# Patient Record
Sex: Male | Born: 1950 | Hispanic: Yes | Marital: Single | Smoking: Former smoker
Health system: Southern US, Community
[De-identification: ages and names within clinical notes are randomized; demographics above are authoritative.]

## PROBLEM LIST (undated history)

## (undated) DIAGNOSIS — E041 Nontoxic single thyroid nodule: Secondary | ICD-10-CM

## (undated) DIAGNOSIS — G459 Transient cerebral ischemic attack, unspecified: Secondary | ICD-10-CM

## (undated) DIAGNOSIS — R911 Solitary pulmonary nodule: Secondary | ICD-10-CM

## (undated) DIAGNOSIS — D649 Anemia, unspecified: Secondary | ICD-10-CM

## (undated) DIAGNOSIS — Z87891 Personal history of nicotine dependence: Secondary | ICD-10-CM

## (undated) DIAGNOSIS — K8010 Calculus of gallbladder with chronic cholecystitis without obstruction: Secondary | ICD-10-CM

## (undated) DIAGNOSIS — Z720 Tobacco use: Secondary | ICD-10-CM

## (undated) DIAGNOSIS — I10 Essential (primary) hypertension: Secondary | ICD-10-CM

## (undated) DIAGNOSIS — I82409 Acute embolism and thrombosis of unspecified deep veins of unspecified lower extremity: Secondary | ICD-10-CM

## (undated) DIAGNOSIS — I2699 Other pulmonary embolism without acute cor pulmonale: Secondary | ICD-10-CM

## (undated) DIAGNOSIS — D72829 Elevated white blood cell count, unspecified: Secondary | ICD-10-CM

## (undated) DIAGNOSIS — E119 Type 2 diabetes mellitus without complications: Secondary | ICD-10-CM

## (undated) HISTORY — PX: OTHER SURGICAL HISTORY: SHX169

## (undated) HISTORY — DX: Elevated white blood cell count, unspecified: D72.829

---

## 2020-10-23 DIAGNOSIS — I82409 Acute embolism and thrombosis of unspecified deep veins of unspecified lower extremity: Secondary | ICD-10-CM

## 2020-10-23 HISTORY — DX: Acute embolism and thrombosis of unspecified deep veins of unspecified lower extremity: I82.409

## 2021-04-12 ENCOUNTER — Inpatient Hospital Stay
Admission: EM | Admit: 2021-04-12 | Discharge: 2021-04-14 | DRG: 176 | Disposition: A | Discharge status: Home / Self Care | Payer: Medicare Other | Attending: Student | Admitting: Student

## 2021-04-12 ENCOUNTER — Encounter: Payer: Self-pay | Admitting: Internal Medicine

## 2021-04-12 ENCOUNTER — Emergency Department: Payer: Medicare Other

## 2021-04-12 ENCOUNTER — Inpatient Hospital Stay: Payer: Medicare Other

## 2021-04-12 ENCOUNTER — Other Ambulatory Visit: Payer: Self-pay

## 2021-04-12 DIAGNOSIS — R918 Other nonspecific abnormal finding of lung field: Secondary | ICD-10-CM | POA: Diagnosis present

## 2021-04-12 DIAGNOSIS — Z7982 Long term (current) use of aspirin: Secondary | ICD-10-CM

## 2021-04-12 DIAGNOSIS — E041 Nontoxic single thyroid nodule: Secondary | ICD-10-CM | POA: Diagnosis present

## 2021-04-12 DIAGNOSIS — Z79899 Other long term (current) drug therapy: Secondary | ICD-10-CM | POA: Diagnosis not present

## 2021-04-12 DIAGNOSIS — I82432 Acute embolism and thrombosis of left popliteal vein: Secondary | ICD-10-CM | POA: Diagnosis present

## 2021-04-12 DIAGNOSIS — R778 Other specified abnormalities of plasma proteins: Secondary | ICD-10-CM | POA: Diagnosis present

## 2021-04-12 DIAGNOSIS — F17210 Nicotine dependence, cigarettes, uncomplicated: Secondary | ICD-10-CM | POA: Diagnosis present

## 2021-04-12 DIAGNOSIS — I700 Atherosclerosis of aorta: Secondary | ICD-10-CM | POA: Diagnosis present

## 2021-04-12 DIAGNOSIS — Z823 Family history of stroke: Secondary | ICD-10-CM

## 2021-04-12 DIAGNOSIS — R110 Nausea: Secondary | ICD-10-CM | POA: Diagnosis present

## 2021-04-12 DIAGNOSIS — R0789 Other chest pain: Secondary | ICD-10-CM | POA: Diagnosis present

## 2021-04-12 DIAGNOSIS — I82409 Acute embolism and thrombosis of unspecified deep veins of unspecified lower extremity: Secondary | ICD-10-CM | POA: Diagnosis present

## 2021-04-12 DIAGNOSIS — Z20822 Contact with and (suspected) exposure to covid-19: Secondary | ICD-10-CM | POA: Diagnosis present

## 2021-04-12 DIAGNOSIS — E876 Hypokalemia: Secondary | ICD-10-CM | POA: Diagnosis present

## 2021-04-12 DIAGNOSIS — I2699 Other pulmonary embolism without acute cor pulmonale: Secondary | ICD-10-CM | POA: Diagnosis present

## 2021-04-12 DIAGNOSIS — I10 Essential (primary) hypertension: Secondary | ICD-10-CM | POA: Diagnosis present

## 2021-04-12 DIAGNOSIS — J438 Other emphysema: Secondary | ICD-10-CM | POA: Diagnosis present

## 2021-04-12 DIAGNOSIS — R079 Chest pain, unspecified: Secondary | ICD-10-CM

## 2021-04-12 DIAGNOSIS — R6883 Chills (without fever): Secondary | ICD-10-CM | POA: Diagnosis present

## 2021-04-12 DIAGNOSIS — Z720 Tobacco use: Secondary | ICD-10-CM | POA: Insufficient documentation

## 2021-04-12 DIAGNOSIS — R7989 Other specified abnormal findings of blood chemistry: Secondary | ICD-10-CM | POA: Diagnosis present

## 2021-04-12 DIAGNOSIS — I82412 Acute embolism and thrombosis of left femoral vein: Secondary | ICD-10-CM | POA: Diagnosis present

## 2021-04-12 DIAGNOSIS — D72829 Elevated white blood cell count, unspecified: Secondary | ICD-10-CM | POA: Diagnosis present

## 2021-04-12 DIAGNOSIS — R911 Solitary pulmonary nodule: Secondary | ICD-10-CM | POA: Diagnosis present

## 2021-04-12 HISTORY — DX: Essential (primary) hypertension: I10

## 2021-04-12 HISTORY — DX: Tobacco use: Z72.0

## 2021-04-12 LAB — RESP PANEL BY RT-PCR (FLU A&B, COVID) ARPGX2
Influenza A by PCR: NEGATIVE
Influenza B by PCR: NEGATIVE
SARS Coronavirus 2 by RT PCR: NEGATIVE

## 2021-04-12 LAB — COMPREHENSIVE METABOLIC PANEL
ALT: 32 U/L (ref 0–44)
AST: 25 U/L (ref 15–41)
Albumin: 4 g/dL (ref 3.5–5.0)
Alkaline Phosphatase: 91 U/L (ref 38–126)
Anion gap: 7 (ref 5–15)
BUN: 17 mg/dL (ref 8–23)
CO2: 28 mmol/L (ref 22–32)
Calcium: 9 mg/dL (ref 8.9–10.3)
Chloride: 102 mmol/L (ref 98–111)
Creatinine, Ser: 1.09 mg/dL (ref 0.61–1.24)
GFR, Estimated: >60 mL/min (ref >60)
Glucose, Bld: 124 mg/dL — ABNORMAL HIGH (ref 70–99)
Potassium: 3.1 mmol/L — ABNORMAL LOW (ref 3.5–5.1)
Sodium: 137 mmol/L (ref 135–145)
Total Bilirubin: 0.6 mg/dL (ref 0.3–1.2)
Total Protein: 7.8 g/dL (ref 6.5–8.1)

## 2021-04-12 LAB — MAGNESIUM: Magnesium: 2 mg/dL (ref 1.7–2.4)

## 2021-04-12 LAB — CBC
HCT: 48.8 % (ref 39.0–52.0)
Hemoglobin: 16.5 g/dL (ref 13.0–17.0)
MCH: 27.8 pg (ref 26.0–34.0)
MCHC: 33.8 g/dL (ref 30.0–36.0)
MCV: 82.3 fL (ref 80.0–100.0)
Platelets: 222 10*3/uL (ref 150–400)
RBC: 5.93 MIL/uL — ABNORMAL HIGH (ref 4.22–5.81)
RDW: 16.4 % — ABNORMAL HIGH (ref 11.5–15.5)
WBC: 13.3 10*3/uL — ABNORMAL HIGH (ref 4.0–10.5)
nRBC: 0 % (ref 0.0–0.2)

## 2021-04-12 LAB — HEPARIN LEVEL (UNFRACTIONATED)
Heparin Unfractionated: 0.33 IU/mL (ref 0.30–0.70)
Heparin Unfractionated: 0.56 IU/mL (ref 0.30–0.70)

## 2021-04-12 LAB — PROTIME-INR
INR: 1 (ref 0.8–1.2)
Prothrombin Time: 13.5 seconds (ref 11.4–15.2)

## 2021-04-12 LAB — HIV ANTIBODY (ROUTINE TESTING W REFLEX): HIV Screen 4th Generation wRfx: NONREACTIVE

## 2021-04-12 LAB — TROPONIN I (HIGH SENSITIVITY)
Troponin I (High Sensitivity): 18 ng/L — ABNORMAL HIGH (ref <18)
Troponin I (High Sensitivity): 17 ng/L (ref <18)

## 2021-04-12 LAB — APTT: aPTT: 31 seconds (ref 24–36)

## 2021-04-12 MED ORDER — DM-GUAIFENESIN ER 30-600 MG PO TB12
1.0000 | ORAL_TABLET | Freq: Two times a day (BID) | ORAL | Status: DC
  Administered 2021-04-12 – 2021-04-14 (×5): 1 via ORAL
  Filled 2021-04-12 (×5): qty 1

## 2021-04-12 MED ORDER — OXYCODONE-ACETAMINOPHEN 5-325 MG PO TABS
1.0000 | ORAL_TABLET | ORAL | Status: DC | PRN
Start: 2021-04-12 — End: 2021-04-14
  Administered 2021-04-12 (×2): 1 via ORAL
  Filled 2021-04-12 (×2): qty 1

## 2021-04-12 MED ORDER — HEPARIN SODIUM (PORCINE) 5000 UNIT/ML IJ SOLN: 4000.0000 [IU] | Freq: Once | INTRAMUSCULAR | Status: DC

## 2021-04-12 MED ORDER — HYDRALAZINE HCL 20 MG/ML IJ SOLN: 5.0000 mg | INTRAMUSCULAR | Status: DC | PRN

## 2021-04-12 MED ORDER — HEPARIN BOLUS VIA INFUSION
4400.0000 [IU] | Freq: Once | INTRAVENOUS | Status: AC
  Administered 2021-04-12: 4400 [IU] via INTRAVENOUS
  Filled 2021-04-12: qty 4400

## 2021-04-12 MED ORDER — ONDANSETRON HCL 4 MG/2ML IJ SOLN: 4.0000 mg | Freq: Three times a day (TID) | INTRAMUSCULAR | Status: DC | PRN

## 2021-04-12 MED ORDER — NICOTINE 21 MG/24HR TD PT24
21.0000 mg | MEDICATED_PATCH | Freq: Every day | TRANSDERMAL | Status: DC
  Administered 2021-04-12 – 2021-04-14 (×3): 21 mg via TRANSDERMAL
  Filled 2021-04-12 (×3): qty 1

## 2021-04-12 MED ORDER — SENNOSIDES-DOCUSATE SODIUM 8.6-50 MG PO TABS: 1.0000 | ORAL_TABLET | Freq: Every evening | ORAL | Status: DC | PRN

## 2021-04-12 MED ORDER — IOHEXOL 350 MG/ML SOLN
75.0000 mL | Freq: Once | INTRAVENOUS | Status: AC | PRN
  Administered 2021-04-12: 75 mL via INTRAVENOUS

## 2021-04-12 MED ORDER — SODIUM CHLORIDE 0.9 % IV BOLUS
500.0000 mL | Freq: Once | INTRAVENOUS | Status: AC
  Administered 2021-04-12: 500 mL via INTRAVENOUS

## 2021-04-12 MED ORDER — ALBUTEROL SULFATE (2.5 MG/3ML) 0.083% IN NEBU: 3.0000 mL | INHALATION_SOLUTION | RESPIRATORY_TRACT | Status: DC | PRN

## 2021-04-12 MED ORDER — POTASSIUM CHLORIDE CRYS ER 20 MEQ PO TBCR
40.0000 meq | EXTENDED_RELEASE_TABLET | Freq: Once | ORAL | Status: AC
  Administered 2021-04-12: 40 meq via ORAL
  Filled 2021-04-12: qty 2

## 2021-04-12 MED ORDER — ASPIRIN EC 81 MG PO TBEC
81.0000 mg | DELAYED_RELEASE_TABLET | Freq: Every day | ORAL | Status: DC
  Administered 2021-04-12 – 2021-04-14 (×3): 81 mg via ORAL
  Filled 2021-04-12 (×3): qty 1

## 2021-04-12 MED ORDER — HEPARIN (PORCINE) 25000 UT/250ML-% IV SOLN: 14.0000 [IU]/kg/h | INTRAVENOUS | Status: DC

## 2021-04-12 MED ORDER — KETOROLAC TROMETHAMINE 30 MG/ML IJ SOLN
15.0000 mg | Freq: Once | INTRAMUSCULAR | Status: AC
  Administered 2021-04-12: 15 mg via INTRAVENOUS
  Filled 2021-04-12: qty 1

## 2021-04-12 MED ORDER — MORPHINE SULFATE (PF) 2 MG/ML IV SOLN: 1.0000 mg | INTRAVENOUS | Status: DC | PRN

## 2021-04-12 MED ORDER — HYDROCHLOROTHIAZIDE 25 MG PO TABS
25.0000 mg | ORAL_TABLET | Freq: Every day | ORAL | Status: DC
  Administered 2021-04-12 – 2021-04-14 (×3): 25 mg via ORAL
  Filled 2021-04-12 (×3): qty 1

## 2021-04-12 MED ORDER — ACETAMINOPHEN 325 MG PO TABS: 650.0000 mg | ORAL_TABLET | Freq: Four times a day (QID) | ORAL | Status: DC | PRN

## 2021-04-12 MED ORDER — HEPARIN (PORCINE) 25000 UT/250ML-% IV SOLN
1200.0000 [IU]/h | INTRAVENOUS | Status: DC
  Administered 2021-04-12: 1300 [IU]/h via INTRAVENOUS
  Administered 2021-04-13: 1200 [IU]/h via INTRAVENOUS
  Filled 2021-04-12 (×3): qty 250

## 2021-04-12 NOTE — Progress Notes (Signed)
ANTICOAGULATION CONSULT NOTE - Initial Consult  Pharmacy Consult for Heparin  Indication: pulmonary embolus  Not on File  Patient Measurements: Height: 5\' 11"  (180.3 cm) Weight: 72.6 kg (160 lb) IBW/kg (Calculated) : 75.3 Heparin Dosing Weight: 72.6 kg   Vital Signs: BP: 119/84 (06/21 1538) Pulse Rate: 71 (06/21 1538)  Labs: Recent Labs    04/12/21 0034 04/12/21 0223 04/12/21 0416 04/12/21 1019 04/12/21 1817  HGB 16.5  --   --   --   --   HCT 48.8  --   --   --   --   PLT 222  --   --   --   --   APTT  --   --  31  --   --   LABPROT  --   --  13.5  --   --   INR  --   --  1.0  --   --   HEPARINUNFRC  --   --   --  0.33 0.56  CREATININE 1.09  --   --   --   --   TROPONINIHS 18* 17  --   --   --      Estimated Creatinine Clearance: 64.8 mL/min (by C-G formula based on SCr of 1.09 mg/dL).   Medical History: Past Medical History:  Diagnosis Date   HTN (hypertension)    Tobacco abuse     Medications:  (Not in a hospital admission)  Assessment: Pharmacy consulted to dose heparin in this 70 yo male admitted with PE.   No prior anticoag noted.  CrCl = 64.8 ml/min   6/21@1019 : HL 0.33, therapeutic x1  Goal of Therapy:  Heparin level 0.3-0.7 units/ml Monitor platelets by anticoagulation protocol: Yes   Plan:  Heparin remains therapeutic x 2 Will continue current heparin rate of 1300 units/hr Check heparin daily while on heparin Continue to monitor H&H and platelets  65, PharmD, BCPS Clinical Pharmacist   04/12/2021,6:39 PM

## 2021-04-12 NOTE — ED Triage Notes (Signed)
Pt in with co left sided chest pain and shob x 2 days. Denies any recent illness, states no cardiac history or lung disease.

## 2021-04-12 NOTE — Progress Notes (Signed)
ANTICOAGULATION CONSULT NOTE - Initial Consult  Pharmacy Consult for Heparin  Indication: pulmonary embolus  Not on File  Patient Measurements: Height: 5\' 11"  (180.3 cm) Weight: 72.6 kg (160 lb) IBW/kg (Calculated) : 75.3 Heparin Dosing Weight: 72.6 kg   Vital Signs: Temp: 98.3 F (36.8 C) (06/21 0031) Temp Source: Oral (06/21 0031) BP: 125/85 (06/21 1130) Pulse Rate: 70 (06/21 1130)  Labs: Recent Labs    04/12/21 0034 04/12/21 0223 04/12/21 0416 04/12/21 1019  HGB 16.5  --   --   --   HCT 48.8  --   --   --   PLT 222  --   --   --   APTT  --   --  31  --   LABPROT  --   --  13.5  --   INR  --   --  1.0  --   HEPARINUNFRC  --   --   --  0.33  CREATININE 1.09  --   --   --   TROPONINIHS 18* 17  --   --      Estimated Creatinine Clearance: 64.8 mL/min (by C-G formula based on SCr of 1.09 mg/dL).   Medical History: Past Medical History:  Diagnosis Date   HTN (hypertension)    Tobacco abuse     Medications:  (Not in a hospital admission)  Assessment: Pharmacy consulted to dose heparin in this 70 yo male admitted with PE.   No prior anticoag noted.  CrCl = 64.8 ml/min     Goal of Therapy:  Heparin level 0.3-0.7 units/ml Monitor platelets by anticoagulation protocol: Yes   Plan:  6/21@1019 : HL 0.33, therapeutic x1 Will continue current heparin rate of 1300 units/hr Check confirmatory heparin level in 8 hours and daily while on heparin Continue to monitor H&H and platelets  Jenicka Coxe A Kacey Vicuna 04/12/2021,12:13 PM

## 2021-04-12 NOTE — ED Notes (Signed)
Report given to [employer] floor nurse

## 2021-04-12 NOTE — ED Provider Notes (Signed)
[facility] Emergency Department Provider Note   ____________________________________________   Event Date/Time   First MD Initiated Contact with Patient 04/12/21 0159     (approximate)  I have reviewed the triage vital signs and the nursing notes.   HISTORY  Chief Complaint Chest Pain    HPI Douglas Smith is a 70 y.o. male who presents to the ED from home with a chief complaint of chest pain.  Patient complains of left-sided chest pain beneath his breast and underneath his armpit, constant, nonradiating x2 days.  Increased in intensity tonight.  Associated with mild shortness of breath.  Describes as sharp pain increased with inspiration.  Denies fever, cough, abdominal pain, nausea, vomiting or dizziness.  Denies recent travel, trauma or hormone use.     Past medical history Hypertension  Patient Active Problem List   Diagnosis Date Noted   Acute pulmonary embolism (HCC) 04/12/2021     Prior to Admission medications   Medication Sig Start Date End Date Taking? Authorizing Provider  aspirin 81 MG EC tablet Take 1 tablet by mouth daily. 09/08/10  Yes [provider]  hydrochlorothiazide (HYDRODIURIL) 25 MG tablet Take 25 mg by mouth daily. 06/15/20  Yes [provider]    Allergies Patient has no allergy information on record.  No family history on file.  Social History Heavy smoker Denies illicit drug use  Review of Systems  Constitutional: No fever/chills Eyes: No visual changes. ENT: No sore throat. Cardiovascular: Positive for chest pain. Respiratory: Positive for shortness of breath. Gastrointestinal: No abdominal pain.  No nausea, no vomiting.  No diarrhea.  No constipation. Genitourinary: Negative for dysuria. Musculoskeletal: Negative for back pain. Skin: Negative for rash. Neurological: Negative for headaches, focal weakness or numbness.   ____________________________________________   PHYSICAL  EXAM:  VITAL SIGNS: ED Triage Vitals  Enc Vitals Group     BP 04/12/21 0031 (!) 133/101     Pulse Rate 04/12/21 0031 96     Resp 04/12/21 0031 20     Temp 04/12/21 0031 98.3 F (36.8 C)     Temp Source 04/12/21 0031 Oral     SpO2 04/12/21 0031 97 %     Weight 04/12/21 0032 160 lb (72.6 kg)     Height 04/12/21 0032 5\' 11"  (1.803 m)     Head Circumference --      Peak Flow --      Pain Score 04/12/21 0032 8     Pain Loc --      Pain Edu? --      Excl. in GC? --     Constitutional: Alert and oriented. Well appearing and in no acute distress. Eyes: Conjunctivae are normal. PERRL. EOMI. Head: Atraumatic. Nose: No congestion/rhinnorhea. Mouth/Throat: Mucous membranes are moist.   Neck: No stridor.   Cardiovascular: Normal rate, regular rhythm. Grossly normal heart sounds.  Good peripheral circulation. Respiratory: Normal respiratory effort.  No retractions. Lungs CTAB.  Tender to palpation lateral left chest.  No splinting.  No crepitus. Gastrointestinal: Soft and nontender to light or deep palpation. No distention. No abdominal bruits. No CVA tenderness. Musculoskeletal: No lower extremity tenderness nor edema.  No joint effusions. Neurologic:  Normal speech and language. No gross focal neurologic deficits are appreciated. No gait instability. Skin:  Skin is warm, dry and intact. No rash noted. Psychiatric: Mood and affect are normal. Speech and behavior are normal.  ____________________________________________   LABS (all labs ordered are listed, but only abnormal results are displayed)  Labs Reviewed  CBC - Abnormal; Notable for the following components:      Result Value   WBC 13.3 (*)    RBC 5.93 (*)    RDW 16.4 (*)    All other components within normal limits  COMPREHENSIVE METABOLIC PANEL - Abnormal; Notable for the following components:   Potassium 3.1 (*)    Glucose, Bld 124 (*)    All other components within normal limits  TROPONIN I (HIGH SENSITIVITY) -  Abnormal; Notable for the following components:   Troponin I (High Sensitivity) 18 (*)    All other components within normal limits  RESP PANEL BY RT-PCR (FLU A&B, COVID) ARPGX2  APTT  PROTIME-INR  HEPARIN LEVEL (UNFRACTIONATED)  TROPONIN I (HIGH SENSITIVITY)   ____________________________________________  EKG  ED ECG REPORT I, Jacquan Savas J, the attending physician, personally viewed and interpreted this ECG.   Date: 04/12/2021  EKG Time: 0034  Rate: 91  Rhythm: normal EKG, normal sinus rhythm  Axis: Normal  Intervals:none  ST&T Change: Nonspecific  ____________________________________________  RADIOLOGY I, Shontae Rosiles J, personally viewed and evaluated these images (plain radiographs) as part of my medical decision making, as well as reviewing the written report by the radiologist.  ED MD interpretation: No acute cardiopulmonary process, left lower lung nodule; CTA demonstrates PEs without right heart strain, lung nodules and thyroid nodule  Official radiology report(s): DG Chest 2 View  Result Date: 04/12/2021 CLINICAL DATA:  Shortness of breath. EXAM: CHEST - 2 VIEW COMPARISON:  None. FINDINGS: Normal heart size and cardiomediastinal contours. No pleural effusion or edema. No airspace opacities. Chronic bronchitic changes noted bilaterally. Left lower lung nodule is identified measuring 1.3 cm. Indeterminate. Visualized osseous structures are unremarkable. IMPRESSION: 1. No acute cardiopulmonary abnormalities. 2. Left lower lung nodule. Recommend further evaluation with nonemergent CT of the chest. Electronically Signed   By: Signa Kell M.D.   On: 04/12/2021 01:23   CT Angio Chest PE W/Cm &/Or Wo Cm  Result Date: 04/12/2021 CLINICAL DATA:  Chest pain. Rule out pulmonary embolus. Shortness of breath for 2 days. EXAM: CT ANGIOGRAPHY CHEST WITH CONTRAST TECHNIQUE: Multidetector CT imaging of the chest was performed using the standard protocol during bolus administration of  intravenous contrast. Multiplanar CT image reconstructions and MIPs were obtained to evaluate the vascular anatomy. CONTRAST:  41mL OMNIPAQUE IOHEXOL 350 MG/ML SOLN COMPARISON:  None. FINDINGS: Cardiovascular: Satisfactory opacification of the pulmonary arteries to the segmental level. There is a filling defect extending from the left upper lobar pulmonary artery into the segmental branches compatible with acute pulmonary embolus, image 45/7. Filling defects within the segmental pulmonary artery to the lingula is also identified, image 176/5. No signs of right heart strain. Heart size is normal. Aortic atherosclerosis. No pericardial effusion. Mediastinum/Nodes: No enlarged mediastinal, hilar, or axillary lymph nodes. Trachea and esophagus demonstrate no significant findings. 2.3 cm nodule in left lobe of thyroid gland is identified, image 31/9. Recommend thyroid US (ref: J Am Coll Radiol. 2015 Feb;12(2): 143-50). No enlarged axillary, supraclavicular, mediastinal or hilar lymph nodes. Lungs/Pleura: No pleural effusion. Paraseptal and centrilobular emphysema with diffuse bronchial wall thickening. Scattered lung nodules are noted, including: -Nodule within the paramediastinal left upper lobe measures 6 mm, image 51/6. -Left lower lobe lung nodule measures 5 mm, image 71/6. -Also in the left lower lobe is a 4 mm nodule, image 54/6. -Subpleural nodule within the lingula measures 5 mm, image 44/6. -Within the periphery of the right lower lobe there is a 6 mm lung nodule,  image 58/6. -Subpleural nodule in the right middle lobe measures 4 mm, image 58/6. No airspace consolidation, atelectasis, or pneumothorax. Upper Abdomen: No acute abnormality. Several kidney cysts are noted. The largest is in the upper pole of right kidney measuring 3 cm. 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 No acute or suspicious osseous findings identified. Review of the MIP images confirms the above  findings. IMPRESSION: 1. Examination is positive for acute pulmonary emboli within the left upper lobar pulmonary artery and segmental branch to the lingula. 2. Multiple, bilateral lung nodules are identified measuring up to 6 mm. Non-contrast chest CT at 3-6 months is recommended. If the nodules are stable at time of repeat CT, then future CT at 18-24 months (from today's scan) is considered optional for low-risk patients, but is recommended for high-risk patients. This recommendation follows the consensus statement: Guidelines for Management of Incidental Pulmonary Nodules Detected on CT Images: From the Fleischner Society 2017; Radiology 2017; 284:228-243. 3. Diffuse bronchial wall thickening with emphysema, as above; imaging findings suggestive of underlying COPD. 4. 2.3 cm left lobe of thyroid gland nodule. Recommend thyroid US (ref: J Am Coll Radiol. 2015 Feb;12(2): 143-50). Aortic Atherosclerosis (ICD10-I70.0) and Emphysema (ICD10-J43.9). Critical Value/emergent results were called by telephone at the time of interpretation on 04/12/2021 at 3:58 am to provider Obadiah Dennard , who verbally acknowledged these results. Electronically Signed   By: Signa Kell M.D.   On: 04/12/2021 03:58    ____________________________________________   PROCEDURES  Procedure(s) performed (including Critical Care):  .1-3 Lead EKG Interpretation  Date/Time: 04/12/2021 2:21 AM Performed by: Irean Hong, MD Authorized by: Irean Hong, MD     Interpretation: normal     ECG rate:  95   ECG rate assessment: normal     Rhythm: sinus rhythm     Ectopy: none     Conduction: normal   Comments:     Placed on cardiac monitor to evaluate for arrhythmias   CRITICAL CARE Performed by: Irean Hong   Total critical care time: 30 minutes  Critical care time was exclusive of separately billable procedures and treating other patients.  Critical care was necessary to treat or prevent imminent or life-threatening  deterioration.  Critical care was time spent personally by me on the following activities: development of treatment plan with patient and/or surrogate as well as nursing, discussions with consultants, evaluation of patient's response to treatment, examination of patient, obtaining history from patient or surrogate, ordering and performing treatments and interventions, ordering and review of laboratory studies, ordering and review of radiographic studies, pulse oximetry and re-evaluation of patient's condition.  ____________________________________________   INITIAL IMPRESSION / ASSESSMENT AND PLAN / ED COURSE  As part of my medical decision making, I reviewed the following data within the electronic MEDICAL RECORD NUMBER Nursing notes reviewed and incorporated, Labs reviewed, EKG interpreted, Old chart reviewed, Radiograph reviewed, and Notes from prior ED visits     70 year old male with hypertension presenting with a 2-day history of pleuritic left chest pain. Differential diagnosis includes, but is not limited to, ACS, aortic dissection, pulmonary embolism, cardiac tamponade, pneumothorax, pneumonia, pericarditis, myocarditis, GI-related causes including esophagitis/gastritis, and musculoskeletal chest wall pain.     Initial troponin 18 with unremarkable EKG.  Mild hypokalemia noted.  Given patient's pleuritic nature of pain, coupled with abnormal chest x-ray, will obtain CTA chest to evaluate for PE.  Repeat time troponin, replete potassium orally.  Administer IV Toradol for pain and reassess.  ____________________________________________   FINAL CLINICAL IMPRESSION(S) / ED DIAGNOSES  Final diagnoses:  Chest pain, unspecified type  Hypokalemia  Other acute pulmonary embolism without acute cor pulmonale (HCC)  Pulmonary nodule  Thyroid nodule     ED Discharge Orders     None        Note:  This document was prepared using Dragon voice recognition software and may include  unintentional dictation errors.    Irean HongSung, Alleen Kehm J, MD 04/12/21 [phone]

## 2021-04-12 NOTE — ED Notes (Signed)
Eating breakfst.

## 2021-04-12 NOTE — H&P (Addendum)
History and Physical    Whitt Auletta ACZ:[id number] DOB: [birth year] DOA: 04/12/2021  Referring MD/NP/PA:   PCP: Pcp, No   Patient coming from:  The patient is coming from home.  At baseline, pt is independent for most of ADL.        Chief Complaint: Chest pain shortness of breath  HPI: Douglas Smith is a 70 y.o. male with medical history significant of hypertension, tobacco abuse, who presents with chest pain and shortness of breath.  Patient states that he has chest pain for more than 3 days, which is located in the left central chest, sharp, moderate, nonradiating, pleuritic, aggravated by deep breath.  Associated with mild shortness of breath.  No cough, fever.  Patient has chills.  Patient has nausea, but no vomiting, diarrhea or abdominal pain.  No symptoms of UTI.  No tenderness in the calf areas.  ED Course: pt was found to have WBC 13.3, INR 1.0, PTT 31, troponin level 18, 17, negative COVID PCR, potassium 3.1, renal function okay, temperature normal, blood pressure 126/89, heart rate 68, RR 20, oxygen saturation 96% on room air.  Chest x-ray showed left lower lobe nodules without infiltration.  Patient is admitted to progressive bed as inpatient by accepting MD.  CTA: 1. Examination is positive for acute pulmonary emboli within the left upper lobar pulmonary artery and segmental branch to the lingula. 2. Multiple, bilateral lung nodules are identified measuring up to 6 mm. Non-contrast chest CT at 3-6 months is recommended. If the nodules are stable at time of repeat CT, then future CT at 18-24 months (from today's scan) is considered optional for low-risk patients, but is recommended for high-risk patients. This recommendation follows the consensus statement: Guidelines for Management of Incidental Pulmonary Nodules Detected on CT Images: From the Fleischner Society 2017; Radiology 2017; 284:228-243. 3. Diffuse bronchial wall thickening with emphysema, as  above; imaging findings suggestive of underlying COPD. 4. 2.3 cm left lobe of thyroid gland nodule. Recommend thyroid US (ref: J Am Coll Radiol. 2015 Feb;12(2): 143-50).   Aortic Atherosclerosis (ICD10-I70.0) and Emphysema (ICD10-J43.9).  Review of Systems:   General: no fevers, chills, no body weight gain, has fatigue HEENT: no blurry vision, hearing changes or sore throat Respiratory: has dyspnea, no coughing, wheezing CV: has chest pain, no palpitations GI: has nausea, no vomiting, abdominal pain, diarrhea, constipation GU: no dysuria, burning on urination, increased urinary frequency, hematuria  Ext: no leg edema Neuro: no unilateral weakness, numbness, or tingling, no vision change or hearing loss Skin: no rash, no skin tear. MSK: No muscle spasm, no deformity, no limitation of range of movement in spin Heme: No easy bruising.  Travel history: No recent long distant travel.  Allergy: Not on File  Past Medical History:  Diagnosis Date   HTN (hypertension)    Tobacco abuse     Past Surgical History:  Procedure Laterality Date   left shoulder surgery Left     Social History:  reports that he has been smoking cigarettes. He has never used smokeless tobacco. He reports previous alcohol use. He reports that he does not use drugs.  Family History:  Family History  Problem Relation Age of Onset   Stroke Brother      Prior to Admission medications   Medication Sig Start Date End Date Taking? Authorizing Provider  aspirin 81 MG EC tablet Take 1 tablet by mouth daily. 09/08/10  Yes [provider]  hydrochlorothiazide (HYDRODIURIL) 25 MG tablet Take 25 mg by mouth  daily. 06/15/20  Yes [provider]    Physical Exam: Vitals:   04/12/21 0600 04/12/21 0700 04/12/21 0800 04/12/21 0830  BP: (!) 129/91 123/85 126/89 130/81  Pulse: 62 66 68 61  Resp: 14 19 14 14   Temp:      TempSrc:      SpO2: 98% 97% 96% 96%  Weight:      Height:       General: Not  in acute distress HEENT:       Eyes: PERRL, EOMI, no scleral icterus.       ENT: No discharge from the ears and nose, no pharynx injection, no tonsillar enlargement.        Neck: No JVD, no bruit, no mass felt. Heme: No neck lymph node enlargement. Cardiac: S1/S2, RRR, No murmurs, No gallops or rubs. Respiratory: No rales, wheezing, rhonchi or rubs. GI: Soft, nondistended, nontender, no rebound pain, no organomegaly, BS present. GU: No hematuria Ext: No pitting leg edema bilaterally. 1+DP/PT pulse bilaterally. Musculoskeletal: No joint deformities, No joint redness or warmth, no limitation of ROM in spin. Skin: No rashes.  Neuro: Alert, oriented X3, cranial nerves II-XII grossly intact, moves all extremities normally.  Psych: Patient is not psychotic, no suicidal or hemocidal ideation.  Labs on Admission: I have personally reviewed following labs and imaging studies  CBC: Recent Labs  Lab 04/12/21 0034  WBC 13.3*  HGB 16.5  HCT 48.8  MCV 82.3  PLT 222   Basic Metabolic Panel: Recent Labs  Lab 04/12/21 0034  NA 137  K 3.1*  CL 102  CO2 28  GLUCOSE 124*  BUN 17  CREATININE 1.09  CALCIUM 9.0   GFR: Estimated Creatinine Clearance: 64.8 mL/min (by C-G formula based on SCr of 1.09 mg/dL). Liver Function Tests: Recent Labs  Lab 04/12/21 0034  AST 25  ALT 32  ALKPHOS 91  BILITOT 0.6  PROT 7.8  ALBUMIN 4.0   No results for input(s): LIPASE, AMYLASE in the last 168 hours. No results for input(s): AMMONIA in the last 168 hours. Coagulation Profile: Recent Labs  Lab 04/12/21 0416  INR 1.0   Cardiac Enzymes: No results for input(s): CKTOTAL, CKMB, CKMBINDEX, TROPONINI in the last 168 hours. BNP (last 3 results) No results for input(s): PROBNP in the last 8760 hours. HbA1C: No results for input(s): HGBA1C in the last 72 hours. CBG: No results for input(s): GLUCAP in the last 168 hours. Lipid Profile: No results for input(s): CHOL, HDL, LDLCALC, TRIG, CHOLHDL,  LDLDIRECT in the last 72 hours. Thyroid Function Tests: No results for input(s): TSH, T4TOTAL, FREET4, T3FREE, THYROIDAB in the last 72 hours. Anemia Panel: No results for input(s): VITAMINB12, FOLATE, FERRITIN, TIBC, IRON, RETICCTPCT in the last 72 hours. Urine analysis: No results found for: COLORURINE, APPEARANCEUR, LABSPEC, PHURINE, GLUCOSEU, HGBUR, BILIRUBINUR, KETONESUR, PROTEINUR, UROBILINOGEN, NITRITE, LEUKOCYTESUR Sepsis Labs: @LABRCNTIP (procalcitonin:4,lacticidven:4) ) Recent Results (from the past 240 hour(s))  Resp Panel by RT-PCR (Flu A&B, Covid) Nasopharyngeal Swab     Status: None   Collection Time: 04/12/21  4:16 AM   Specimen: Nasopharyngeal Swab; Nasopharyngeal(NP) swabs in vial transport medium  Result Value Ref Range Status   SARS Coronavirus 2 by RT PCR NEGATIVE NEGATIVE Final    Comment: (NOTE) SARS-CoV-2 target nucleic acids are NOT DETECTED.  The SARS-CoV-2 RNA is generally detectable in upper respiratory specimens during the acute phase of infection. The lowest concentration of SARS-CoV-2 viral copies this assay can detect is 138 copies/mL. A negative result does not preclude  SARS-Cov-2 infection and should not be used as the sole basis for treatment or other patient management decisions. A negative result may occur with  improper specimen collection/handling, submission of specimen other than nasopharyngeal swab, presence of viral mutation(s) within the areas targeted by this assay, and inadequate number of viral copies(<138 copies/mL). A negative result must be combined with clinical observations, patient history, and epidemiological information. The expected result is Negative.  Fact Sheet for Patients:  BloggerCourse.comhttps://www.fda.gov/media/152166/download  Fact Sheet for Healthcare Providers:  SeriousBroker.ithttps://www.fda.gov/media/152162/download  This test is no t yet approved or cleared by the [place] FDA and  has been authorized for detection and/or diagnosis of  SARS-CoV-2 by FDA under an Emergency Use Authorization (EUA). This EUA will remain  in effect (meaning this test can be used) for the duration of the COVID-19 declaration under Section 564(b)(1) of the Act, 21 U.S.C.section 360bbb-3(b)(1), unless the authorization is terminated  or revoked sooner.       Influenza A by PCR NEGATIVE NEGATIVE Final   Influenza B by PCR NEGATIVE NEGATIVE Final    Comment: (NOTE) The Xpert Xpress SARS-CoV-2/FLU/RSV plus assay is intended as an aid in the diagnosis of influenza from Nasopharyngeal swab specimens and should not be used as a sole basis for treatment. Nasal washings and aspirates are unacceptable for Xpert Xpress SARS-CoV-2/FLU/RSV testing.  Fact Sheet for Patients: BloggerCourse.comhttps://www.fda.gov/media/152166/download  Fact Sheet for Healthcare Providers: SeriousBroker.ithttps://www.fda.gov/media/152162/download  This test is not yet approved or cleared by the [place] FDA and has been authorized for detection and/or diagnosis of SARS-CoV-2 by FDA under an Emergency Use Authorization (EUA). This EUA will remain in effect (meaning this test can be used) for the duration of the COVID-19 declaration under Section 564(b)(1) of the Act, 21 U.S.C. section 360bbb-3(b)(1), unless the authorization is terminated or revoked.  Performed at [facility], [address] [place], [place] [address]      Radiological Exams on Admission: DG Chest 2 View  Result Date: 04/12/2021 CLINICAL DATA:  Shortness of breath. EXAM: CHEST - 2 VIEW COMPARISON:  None. FINDINGS: Normal heart size and cardiomediastinal contours. No pleural effusion or edema. No airspace opacities. Chronic bronchitic changes noted bilaterally. Left lower lung nodule is identified measuring 1.3 cm. Indeterminate. Visualized osseous structures are unremarkable. IMPRESSION: 1. No acute cardiopulmonary abnormalities. 2. Left lower lung nodule. Recommend further evaluation with nonemergent CT of the  chest. Electronically Signed   By: Signa Kellaylor  Stroud M.D.   On: 04/12/2021 01:23   CT Angio Chest PE W/Cm &/Or Wo Cm  Result Date: 04/12/2021 CLINICAL DATA:  Chest pain. Rule out pulmonary embolus. Shortness of breath for 2 days. EXAM: CT ANGIOGRAPHY CHEST WITH CONTRAST TECHNIQUE: Multidetector CT imaging of the chest was performed using the standard protocol during bolus administration of intravenous contrast. Multiplanar CT image reconstructions and MIPs were obtained to evaluate the vascular anatomy. CONTRAST:  75mL OMNIPAQUE IOHEXOL 350 MG/ML SOLN COMPARISON:  None. FINDINGS: Cardiovascular: Satisfactory opacification of the pulmonary arteries to the segmental level. There is a filling defect extending from the left upper lobar pulmonary artery into the segmental branches compatible with acute pulmonary embolus, image 45/7. Filling defects within the segmental pulmonary artery to the lingula is also identified, image 176/5. No signs of right heart strain. Heart size is normal. Aortic atherosclerosis. No pericardial effusion. Mediastinum/Nodes: No enlarged mediastinal, hilar, or axillary lymph nodes. Trachea and esophagus demonstrate no significant findings. 2.3 cm nodule in left lobe of thyroid gland is identified, image 31/9. Recommend thyroid US (  ref: J Am Coll Radiol. 2015 Feb;12(2): 143-50). No enlarged axillary, supraclavicular, mediastinal or hilar lymph nodes. Lungs/Pleura: No pleural effusion. Paraseptal and centrilobular emphysema with diffuse bronchial wall thickening. Scattered lung nodules are noted, including: -Nodule within the paramediastinal left upper lobe measures 6 mm, image 51/6. -Left lower lobe lung nodule measures 5 mm, image 71/6. -Also in the left lower lobe is a 4 mm nodule, image 54/6. -Subpleural nodule within the lingula measures 5 mm, image 44/6. -Within the periphery of the right lower lobe there is a 6 mm lung nodule, image 58/6. -Subpleural nodule in the right middle lobe  measures 4 mm, image 58/6. No airspace consolidation, atelectasis, or pneumothorax. Upper Abdomen: No acute abnormality. Several kidney cysts are noted. The largest is in the upper pole of right kidney measuring 3 cm. 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 No acute or suspicious osseous findings identified. Review of the MIP images confirms the above findings. IMPRESSION: 1. Examination is positive for acute pulmonary emboli within the left upper lobar pulmonary artery and segmental branch to the lingula. 2. Multiple, bilateral lung nodules are identified measuring up to 6 mm. Non-contrast chest CT at 3-6 months is recommended. If the nodules are stable at time of repeat CT, then future CT at 18-24 months (from today's scan) is considered optional for low-risk patients, but is recommended for high-risk patients. This recommendation follows the consensus statement: Guidelines for Management of Incidental Pulmonary Nodules Detected on CT Images: From the Fleischner Society 2017; Radiology 2017; 284:228-243. 3. Diffuse bronchial wall thickening with emphysema, as above; imaging findings suggestive of underlying COPD. 4. 2.3 cm left lobe of thyroid gland nodule. Recommend thyroid US (ref: J Am Coll Radiol. 2015 Feb;12(2): 143-50). Aortic Atherosclerosis (ICD10-I70.0) and Emphysema (ICD10-J43.9). Critical Value/emergent results were called by telephone at the time of interpretation on 04/12/2021 at 3:58 am to provider JADE SUNG , who verbally acknowledged these results. Electronically Signed   By: Signa Kell M.D.   On: 04/12/2021 03:58     EKG: I have personally reviewed.  Sinus rhythm, QTC 447, possible left atrial enlargement, nonspecific to change   assessment/Plan Principal Problem:   Acute pulmonary embolism (HCC) Active Problems:   HTN (hypertension)   Lung nodule   Thyroid nodule   Leukocytosis   Hypokalemia   Elevated troponin   Tobacco abuse   Acute  pulmonary embolism (HCC): No oxygen desaturation.  Hemodynamically stable -admitted to progressive bed as inpt -heparin drip initiated -LE dopplers ordered to evaluate for DVT -pain control: prn Tylenol, Percocet, morphine -As needed albuterol  Addendum: Sonographic survey of the left lower extremity positive for nonocclusive DVT of the distal femoral vein and popliteal vein. Sonographic survey of the right lower extremity negative for DVT  HTN (hypertension) -IV hydralazine as needed -Continue home HCTZ  Leukocytosis: WBC 13.3.  No fever.  No source of infection identified, likely reactive -Follow-up with CBC  Hypokalemia: K= 3.1 on admission. - Repleted - Check Mg level  Tobacco abuse -Did counseling about importance of quitting smoking -Nicotine patch  Lung nodule and thyroid nodule: -f/u with PCP  Elevated troponin: trop 18 -->17, likely nonspecific versus demand ischemia. -Will not continue to trend troponin.          DVT ppx: on IV Heparin   Code Status: Full code Family Communication:  Yes, patient's  daughter by phone Disposition Plan:  Anticipate discharge back to previous environment Consults called:  none Admission status and Level of care:  Progressive Cardiac:  as inpt         Status is: Inpatient  Remains inpatient appropriate because:Inpatient level of care appropriate due to severity of illness  Dispo: The patient is from: Home              Anticipated d/c is to: Home              Patient currently is not medically stable to d/c.   Difficult to place patient No          Date of Service 04/12/2021    Lorretta Harp [facility]   If 7PM-7AM, please contact night-coverage [email or web address] 04/12/2021, 10:08 AM

## 2021-04-12 NOTE — Progress Notes (Signed)
ANTICOAGULATION CONSULT NOTE - Initial Consult  Pharmacy Consult for Heparin  Indication: pulmonary embolus  Not on File  Patient Measurements: Height: 5\' 11"  (180.3 cm) Weight: 72.6 kg (160 lb) IBW/kg (Calculated) : 75.3 Heparin Dosing Weight: 72.6 kg   Vital Signs: Temp: 98.3 F (36.8 C) (06/21 0031) Temp Source: Oral (06/21 0031) BP: 133/101 (06/21 0031) Pulse Rate: 96 (06/21 0031)  Labs: Recent Labs    04/12/21 0034 04/12/21 0223  HGB 16.5  --   HCT 48.8  --   PLT 222  --   CREATININE 1.09  --   TROPONINIHS 18* 17    Estimated Creatinine Clearance: 64.8 mL/min (by C-G formula based on SCr of 1.09 mg/dL).   Medical History: No past medical history on file.  Medications:  (Not in a hospital admission)   Assessment: Pharmacy consulted to dose heparin in this 70 yo male admitted with PE.   No prior anticoag noted.  CrCl = 64.8 ml/min   Goal of Therapy:  Heparin level 0.3-0.7 units/ml Monitor platelets by anticoagulation protocol: Yes   Plan:  Give 4400 units bolus x 1 Start heparin infusion at 1300 units/hr Check anti-Xa level in 6 hours and daily while on heparin Continue to monitor H&H and platelets  Claron Rosencrans D 04/12/2021,4:02 AM

## 2021-04-12 NOTE — ED Notes (Signed)
Pt eating breakfast 

## 2021-04-13 ENCOUNTER — Inpatient Hospital Stay: Payer: Medicare Other

## 2021-04-13 DIAGNOSIS — I2699 Other pulmonary embolism without acute cor pulmonale: Principal | ICD-10-CM

## 2021-04-13 LAB — HEPARIN LEVEL (UNFRACTIONATED)
Heparin Unfractionated: 0.71 IU/mL — ABNORMAL HIGH (ref 0.30–0.70)
Heparin Unfractionated: 0.58 IU/mL (ref 0.30–0.70)
Heparin Unfractionated: 0.46 IU/mL (ref 0.30–0.70)

## 2021-04-13 LAB — CBC
HCT: 46.5 % (ref 39.0–52.0)
Hemoglobin: 15.3 g/dL (ref 13.0–17.0)
MCH: 27.4 pg (ref 26.0–34.0)
MCHC: 32.9 g/dL (ref 30.0–36.0)
MCV: 83.3 fL (ref 80.0–100.0)
Platelets: 181 10*3/uL (ref 150–400)
RBC: 5.58 MIL/uL (ref 4.22–5.81)
RDW: 16.3 % — ABNORMAL HIGH (ref 11.5–15.5)
WBC: 7.1 10*3/uL (ref 4.0–10.5)
nRBC: 0 % (ref 0.0–0.2)

## 2021-04-13 NOTE — Progress Notes (Signed)
ANTICOAGULATION CONSULT NOTE - Initial Consult  Pharmacy Consult for Heparin  Indication: pulmonary embolus  Not on File  Patient Measurements: Height: 5\' 11"  (180.3 cm) Weight: 77.8 kg (171 lb 9.6 oz) IBW/kg (Calculated) : 75.3 Heparin Dosing Weight: 72.6 kg   Vital Signs: Temp: 98 F (36.7 C) (06/22 0555) Temp Source: Oral (06/22 0555) BP: 121/83 (06/22 0555) Pulse Rate: 74 (06/21 2000)  Labs: Recent Labs    04/12/21 0034 04/12/21 0223 04/12/21 0416 04/12/21 1019 04/12/21 1817 04/13/21 0538  HGB 16.5  --   --   --   --  15.3  HCT 48.8  --   --   --   --  46.5  PLT 222  --   --   --   --  181  APTT  --   --  31  --   --   --   LABPROT  --   --  13.5  --   --   --   INR  --   --  1.0  --   --   --   HEPARINUNFRC  --   --   --  0.33 0.56 0.71*  CREATININE 1.09  --   --   --   --   --   TROPONINIHS 18* 17  --   --   --   --      Estimated Creatinine Clearance: 67.2 mL/min (by C-G formula based on SCr of 1.09 mg/dL).   Medical History: Past Medical History:  Diagnosis Date   HTN (hypertension)    Tobacco abuse     Medications:  Medications Prior to Admission  Medication Sig Dispense Refill Last Dose   aspirin 81 MG EC tablet Take 1 tablet by mouth daily.   unknown at unknown   hydrochlorothiazide (HYDRODIURIL) 25 MG tablet Take 25 mg by mouth daily.   [facility]u at [facility]    Assessment: Pharmacy consulted to dose heparin in this 70 yo male admitted with PE.   No prior anticoag noted.  CrCl = 64.8 ml/min   6/21@1019 : HL 0.33, therapeutic x1 6/22@0538 : HL 0.71, supratherapeutic   Goal of Therapy:  Heparin level 0.3-0.7 units/ml Monitor platelets by anticoagulation protocol: Yes   Plan:  6/22:  HL @ 0538 = 0.71 Will decrease heparin drip rate to 1200 units/hr and recheck HL 6 hrs after rate change.   Candid Bovey D, PharmD, Clinical Pharmacist   04/13/2021,7:02 AM

## 2021-04-13 NOTE — Progress Notes (Signed)
[facility] Progress Note  Patient: Douglas Smith    JOA:[id number]  DOA: 04/12/2021     Date of Service: the patient was seen and examined on 04/13/2021  Chief Complaint  Patient presents with   Chest Pain   Brief hospital course: Douglas Smith is a 70 y.o. male with medical history significant of hypertension, tobacco abuse, who presents with chest pain and shortness of breath. Patient states that he has chest pain for more than 3 days, which is located in the left central chest, sharp, moderate, nonradiating, pleuritic, aggravated by deep breath.  Associated with mild shortness of breath.  No cough, fever.  Patient has chills.  Patient has nausea, but no vomiting, diarrhea or abdominal pain.  No symptoms of UTI.  No tenderness in the calf areas.   ED Course: pt was found to have WBC 13.3, INR 1.0, PTT 31, troponin level 18, 17, negative COVID PCR, potassium 3.1, renal function okay, temperature normal, blood pressure 126/89, heart rate 68, RR 20, oxygen saturation 96% on room air.  Chest x-ray showed left lower lobe nodules without infiltration.  Patient is admitted to progressive bed as inpatient by accepting MD.   CTA: 1. Examination is positive for acute pulmonary emboli within the left upper lobar pulmonary artery and segmental branch to the lingula. 2. Multiple, bilateral lung nodules are identified measuring up to 6 mm. Non-contrast chest CT at 3-6 months is recommended. If the nodules are stable at time of repeat CT, then future CT at 18-24 months (from today's scan) is considered optional for low-risk patients, but is recommended for high-risk patients. This recommendation follows the consensus statement: Guidelines for Management of Incidental Pulmonary Nodules Detected on CT Images: From the Fleischner Society 2017; Radiology 2017; 284:228-243. 3. Diffuse bronchial wall thickening with emphysema, as above; imaging findings suggestive of underlying COPD. 4. 2.3  cm left lobe of thyroid gland nodule. Recommend thyroid US (ref: J Am Coll Radiol. 2015 Feb;12(2): 143-50).   Aortic Atherosclerosis (ICD10-I70.0) and Emphysema (ICD10-J43.9).    Currently further plan is to continue IV heparin infusion overnight and transition to Eliquis tomorrow a.m.  Follow thyroid sonogram.  Assessment and Plan: Principal Problem:   Acute pulmonary embolism (HCC) Active Problems:   HTN (hypertension)   Lung nodule   Thyroid nodule   Leukocytosis   Hypokalemia   Elevated troponin   Tobacco abuse     Acute pulmonary embolism (HCC): No oxygen desaturation.  Hemodynamically stable -admitted to progressive bed as inpt -heparin drip initiated -LE dopplers ordered to evaluate for DVT -pain control: prn Tylenol, Percocet, morphine -As needed albuterol Venous duplex of LLE positive for nonocclusive DVT of the distal femoral vein and popliteal vein. right lower extremity negative for DVT Plan is to start Eliquis tomorrow a.m., patient was explained about risk and benefit of anticoagulation, advised to return back to ED if any signs and symptoms of bleeding Recommended to follow with hematologist as an outpatient for further work-up and duration of anticoagulation  HTN (hypertension) -IV hydralazine as needed -Continue home HCTZ   Leukocytosis: WBC 13.3.  No fever.  No source of infection identified, likely reactive -Follow-up with CBC   Hypokalemia: K= 3.1 on admission. - Repleted - Check Mg level   Tobacco abuse -Did counseling about importance of quitting smoking -Nicotine patch   Lung nodule and thyroid nodule: -f/u with PCP and pulmonologist to repeat CT scan of chest after 3 to 6 months F/u thyroid sonogram   Elevated troponin: trop 18 -->  17, likely nonspecific versus demand ischemia. Patient denied any chest at this time    Body mass index is 23.93 kg/m.  Interventions:         Diet: Heart healthy DVT Prophylaxis: Therapeutic  Anticoagulation with heparin IV infusion    Advance goals of care discussion: Full code  Family Communication: family was taught present at bedside, but discussed over the phone at the time of interview.  The pt provided permission to discuss medical plan with the family. Opportunity was given to ask question and all questions were answered satisfactorily.   Disposition:  Pt is from Home, admitted with pulmonary embolism, chest pain or shortness of breath, still on IV heparin infusion, which precludes a safe discharge. Discharge to home, most likely tomorrow a.m. after transitioning to DOAC.  Subjective: No acute overnight issues, patient presented with shortness of breath and chest pain which has been resolved, patient is feeling much better now, saturating well on room air.  Patient denied any active issues at this time. Discussed with patient's daughter over the phone while my interview with patient, all questions and concerns answered.   Physical Exam: General:  alert oriented to time, place, and person.  Appear in no distress, affect appropriate Eyes: PERRLA ENT: Oral Mucosa Clear, moist  Neck: no JVD,  Cardiovascular: S1 and S2 Present, no Murmur,  Respiratory: good respiratory effort, Bilateral Air entry equal and Decreased, no Crackles, no wheezes Abdomen: Bowel Sound present, Soft and no tenderness,  Skin: no rashes Extremities: no Pedal edema, no calf tenderness Neurologic: without any new focal findings Gait not checked due to patient safety concerns  Vitals:   04/13/21 0555 04/13/21 0831 04/13/21 1237 04/13/21 1521  BP: 121/83 120/86 (!) 139/92 (!) 143/95  Pulse:  70 74 75  Resp: 20 18 18 18   Temp: 98 F (36.7 C) 98.3 F (36.8 C) 98.9 F (37.2 C) 98.3 F (36.8 C)  TempSrc: Oral     SpO2: 95% 96% 97% 98%  Weight:      Height:        Intake/Output Summary (Last 24 hours) at 04/13/2021 1555 Last data filed at 04/13/2021 1500 Gross per 24 hour  Intake 94.82 ml   Output 800 ml  Net -705.18 ml   Filed Weights   04/12/21 0032 04/12/21 2342  Weight: 72.6 kg 77.8 kg    Data Reviewed: I have personally reviewed and interpreted daily labs, tele strips, imagings as discussed above. I reviewed all nursing notes, pharmacy notes, vitals, pertinent old records I have discussed plan of care as described above with RN and patient/family.  CBC: Recent Labs  Lab 04/12/21 0034 04/13/21 0538  WBC 13.3* 7.1  HGB 16.5 15.3  HCT 48.8 46.5  MCV 82.3 83.3  PLT 222 181   Basic Metabolic Panel: Recent Labs  Lab 04/12/21 0034 04/12/21 1019  NA 137  --   K 3.1*  --   CL 102  --   CO2 28  --   GLUCOSE 124*  --   BUN 17  --   CREATININE 1.09  --   CALCIUM 9.0  --   MG  --  2.0    Studies: No results found.  Scheduled Meds:  aspirin EC  81 mg Oral Daily   dextromethorphan-guaiFENesin  1 tablet Oral BID   hydrochlorothiazide  25 mg Oral Daily   nicotine  21 mg Transdermal Daily   Continuous Infusions:  heparin 1,200 Units/hr (04/13/21 0732)   PRN Meds: acetaminophen,  albuterol, hydrALAZINE, morphine injection, ondansetron (ZOFRAN) IV, oxyCODONE-acetaminophen, senna-docusate  Time spent: 35 minutes  Author: Gillis Santa. MD [facility] 04/13/2021 3:55 PM  To reach On-call, see care teams to locate the attending and reach out to them via [email or web address]. If 7PM-7AM, please contact night-coverage If you still have difficulty reaching the attending provider, please page the [facility] (Director on Call) for [facility] on amion for assistance.

## 2021-04-13 NOTE — Progress Notes (Signed)
Pt was admitted in the floor with no signs of distress. Pt alert x 4. VSS. Pt was educated about safety and ascom within pt reach. Will continue to monitor. °

## 2021-04-13 NOTE — Plan of Care (Signed)
  Problem: Education: Goal: Knowledge of General Education information will improve Description: Including pain rating scale, medication(s)/side effects and non-pharmacologic comfort measures Outcome: Progressing   Problem: Clinical Measurements: Goal: Cardiovascular complication will be avoided Outcome: Progressing   Problem: Elimination: Goal: Will not experience complications related to bowel motility Outcome: Progressing   Problem: Elimination: Goal: Will not experience complications related to urinary retention Outcome: Progressing   Problem: Pain Managment: Goal: General experience of comfort will improve Outcome: Progressing

## 2021-04-13 NOTE — Progress Notes (Signed)
ANTICOAGULATION CONSULT NOTE - Initial Consult  Pharmacy Consult for Heparin  Indication: pulmonary embolus  Not on File  Patient Measurements: Height: 5\' 11"  (180.3 cm) Weight: 77.8 kg (171 lb 9.6 oz) IBW/kg (Calculated) : 75.3 Heparin Dosing Weight: 72.6 kg   Vital Signs: Temp: 98.3 F (36.8 C) (06/22 1521) Temp Source: Oral (06/22 0555) BP: 143/95 (06/22 1521) Pulse Rate: 75 (06/22 1521)  Labs: Recent Labs    04/12/21 0034 04/12/21 0223 04/12/21 0416 04/12/21 1019 04/12/21 1817 04/13/21 0538 04/13/21 1559  HGB 16.5  --   --   --   --  15.3  --   HCT 48.8  --   --   --   --  46.5  --   PLT 222  --   --   --   --  181  --   APTT  --   --  31  --   --   --   --   LABPROT  --   --  13.5  --   --   --   --   INR  --   --  1.0  --   --   --   --   HEPARINUNFRC  --   --   --    < > 0.56 0.71* 0.58  CREATININE 1.09  --   --   --   --   --   --   TROPONINIHS 18* 17  --   --   --   --   --    < > = values in this interval not displayed.     Estimated Creatinine Clearance: 67.2 mL/min (by C-G formula based on SCr of 1.09 mg/dL).   Medical History: Past Medical History:  Diagnosis Date   HTN (hypertension)    Tobacco abuse     Medications:  Medications Prior to Admission  Medication Sig Dispense Refill Last Dose   aspirin 81 MG EC tablet Take 1 tablet by mouth daily.   unknown at unknown   hydrochlorothiazide (HYDRODIURIL) 25 MG tablet Take 25 mg by mouth daily.   [facility]u at [facility]    Assessment: Pharmacy consulted to dose heparin in this 70 yo male admitted with PE.   No prior anticoag noted.  CrCl = 64.8 ml/min   6/21@1019 : HL 0.33, therapeutic x1 6/22@0538 : HL 0.71, supratherapeutic  6/22 @1600 : HL 0.58, therapeutic   Goal of Therapy:  Heparin level 0.3-0.7 units/ml Monitor platelets by anticoagulation protocol: Yes   Plan:  Heparin therapeutic.  Will continue heparin drip at 1200 units/hr Recheck HL 6 hrs  CBC daily   7/22, PharmD,  BCPS Clinical Pharmacist   04/13/2021,4:56 PM

## 2021-04-14 LAB — CBC
HCT: 45.9 % (ref 39.0–52.0)
Hemoglobin: 15.5 g/dL (ref 13.0–17.0)
MCH: 27.9 pg (ref 26.0–34.0)
MCHC: 33.8 g/dL (ref 30.0–36.0)
MCV: 82.6 fL (ref 80.0–100.0)
Platelets: 193 10*3/uL (ref 150–400)
RBC: 5.56 MIL/uL (ref 4.22–5.81)
RDW: 15.9 % — ABNORMAL HIGH (ref 11.5–15.5)
WBC: 7 10*3/uL (ref 4.0–10.5)
nRBC: 0 % (ref 0.0–0.2)

## 2021-04-14 LAB — BASIC METABOLIC PANEL
Anion gap: 7 (ref 5–15)
BUN: 14 mg/dL (ref 8–23)
CO2: 26 mmol/L (ref 22–32)
Calcium: 8.9 mg/dL (ref 8.9–10.3)
Chloride: 106 mmol/L (ref 98–111)
Creatinine, Ser: 0.95 mg/dL (ref 0.61–1.24)
GFR, Estimated: >60 mL/min (ref >60)
Glucose, Bld: 109 mg/dL — ABNORMAL HIGH (ref 70–99)
Potassium: 3.4 mmol/L — ABNORMAL LOW (ref 3.5–5.1)
Sodium: 139 mmol/L (ref 135–145)

## 2021-04-14 LAB — PHOSPHORUS: Phosphorus: 3.2 mg/dL (ref 2.5–4.6)

## 2021-04-14 LAB — MAGNESIUM: Magnesium: 2.3 mg/dL (ref 1.7–2.4)

## 2021-04-14 LAB — HEPARIN LEVEL (UNFRACTIONATED): Heparin Unfractionated: 0.45 IU/mL (ref 0.30–0.70)

## 2021-04-14 MED ORDER — APIXABAN 5 MG PO TABS
10.0000 mg | ORAL_TABLET | Freq: Two times a day (BID) | ORAL | Status: DC
  Administered 2021-04-14: 10 mg via ORAL
  Filled 2021-04-14: qty 2

## 2021-04-14 MED ORDER — NICOTINE 21 MG/24HR TD PT24: 21.0000 mg | MEDICATED_PATCH | Freq: Every day | TRANSDERMAL | 0 refills | Status: DC

## 2021-04-14 MED ORDER — POTASSIUM CHLORIDE CRYS ER 20 MEQ PO TBCR
40.0000 meq | EXTENDED_RELEASE_TABLET | Freq: Once | ORAL | Status: AC
  Administered 2021-04-14: 40 meq via ORAL
  Filled 2021-04-14: qty 2

## 2021-04-14 MED ORDER — APIXABAN 5 MG PO TABS: 5.0000 mg | ORAL_TABLET | Freq: Two times a day (BID) | ORAL | Status: DC

## 2021-04-14 MED ORDER — ELIQUIS DVT/PE STARTER PACK 5 MG PO TBPK
ORAL_TABLET | ORAL | 0 refills | Status: DC
Start: 2021-04-14 — End: 2021-04-28

## 2021-04-14 NOTE — Discharge Summary (Signed)
[facility] Discharge Summary   Patient: Douglas SellOrlando Smith ZOX:[id number]  PCP: Pcp, No  Date of admission: 04/12/2021   Date of discharge:  04/14/2021     Discharge Diagnoses:  Principal Problem:   Acute pulmonary embolism (HCC) Active Problems:   HTN (hypertension)   Lung nodule   Thyroid nodule   Leukocytosis   Hypokalemia   Elevated troponin   Tobacco abuse   DVT (deep venous thrombosis) (HCC)   Admitted From: Home Disposition:  Home   Recommendations for Outpatient Follow-up:  PCP: in 1 wk Hematologist in 1-2 wks Follow up LABS/TEST:  Thyroid nodule bipsy, repaet Thyroid US after 1 year, hypercoagulable work-up        Non-contrast chest CT at 3-6 months for pulmonary nodules  Diet recommendation: Cardiac diet  Activity: The patient is advised to gradually reintroduce usual activities, as tolerated  Discharge Condition: stable  Code Status: Full code   History of present illness: As per the H and P dictated on admission. Douglas HurryOrlando Leeanne Smith is a 70 y.o. male with medical history significant of hypertension, tobacco abuse, who presents with chest pain and shortness of breath. Patient states that he has chest pain for more than 3 days, which is located in the left central chest, sharp, moderate, nonradiating, pleuritic, aggravated by deep breath.  Associated with mild shortness of breath.  No cough, fever.  Patient has chills.  Patient has nausea, but no vomiting, diarrhea or abdominal pain.  No symptoms of UTI.  No tenderness in the calf areas. ED Course: pt was found to have WBC 13.3, INR 1.0, PTT 31, troponin level 18, 17, negative COVID PCR, potassium 3.1, renal function okay, temperature normal, blood pressure 126/89, heart rate 68, RR 20, oxygen saturation 96% on room air.  Chest x-ray showed left lower lobe nodules without infiltration.  Patient is admitted to progressive bed as inpatient by accepting MD. CTA: 1. Examination is positive for acute pulmonary emboli  within theleft upper lobar pulmonary artery and segmental branch to the lingula. 2. Multiple, bilateral lung nodules are identified measuring up to 6mm. Non-contrast chest CT at 3-6 months is recommended. If thenodules are stable at time of repeat CT, then future CT at 18-24 months (from today's scan) is considered optional for low-risk patients, but is recommended for high-risk patients. This recommendation follows the consensus statement: Guidelines for Management of Incidental Pulmonary Nodules Detected on CT Images: From the Fleischner Society 2017; Radiology 2017; 284:228-243. 3. Diffuse bronchial wall thickening with emphysema, as above; imaging findings suggestive of underlying COPD. 4. 2.3 cm left lobe of thyroid gland nodule. Recommend thyroid US (ref: J Am Coll Radiol. 2015 Feb;12(2): 143-50). Aortic Atherosclerosis (ICD10-I70.0) and Emphysema (ICD10-J43.9). Hospital Course:  Assessment and Plan: Principal Problem:   Acute pulmonary embolism (HCC) Active Problems:   HTN (hypertension)   Lung nodule   Thyroid nodule   Leukocytosis   Hypokalemia   Elevated troponin   Tobacco abuse Acute pulmonary embolism: No oxygen desaturation.  hemodynamically stable. S/p Heparin drip. pain control: s/p prn Tylenol, Percocet, morphine. As needed albuterol. Venous duplex of LLE positive for nonocclusive DVT of the distal femoral vein and popliteal vein. right lower extremity negative for DVT.  Patient was transitioned to DOAC Eliquis.  Recommended to follow with hematologist for hypercoagulable work-up and duration of anticoagulation.  Return back to ED if any signs of bleeding or bruises HTN (hypertension), Continue home HCTZ Leukocytosis: WBC 13.3.  No fever.  No source of infection identified, likely reactive, Resolved  Hypokalemia: K= 3.1 on admission. Repleted, Mg level wnl Tobacco abuse, Did counseling about importance of quitting smoking, prescribed nicotine patch Lung nodule and thyroid  nodule: -f/u with PCP and pulmonologist to repeat CT scan of chest after 3 to 6 months US thyroid: 1. Multinodular goiter. 2. Solid nodule in the left inferior thyroid (labeled 8, 1.8 cm) meets criteria (TI-RADS category 4) for tissue sampling. Recommend ultrasound-guided fine-needle aspiration. 3. Solid nodules in the left mid (labeled 6, 2.4 cm) and left inferior (labeled 7, 2.0 cm) meet criteria (TI-RADS category 3) for 1 year ultrasound follow-up. 4. The remaining visualized thyroid nodules appear benign and do not warrant additional ultrasound follow-up or tissue sampling. Elevated troponin: trop 18 -->17, likely nonspecific versus demand ischemia. Patient denied any chest at this time Body mass index is 23.93 kg/m.  Interventions: Patient was ambulatory without any assistance. On the day of the discharge the patient's vitals were stable, and no other acute medical condition were reported by patient. the patient was felt safe to be discharge at Home.  Consultants: None Procedures: None  Discharge Exam: General: Appear in no distress, no Rash; Oral Mucosa Clear, moist. Cardiovascular: S1 and S2 Present, no Murmur, Respiratory: normal respiratory effort, Bilateral Air entry present and no Crackles, no wheezes Abdomen: Bowel Sound present, Soft and no tenderness, no hernia Extremities: no Pedal edema, no calf tenderness Neurology: alert and oriented to time, place, and person affect appropriate.  Filed Weights   04/12/21 0032 04/12/21 2342 04/14/21 0626  Weight: 72.6 kg 77.8 kg 77.8 kg   Vitals:   04/14/21 1022 04/14/21 1139  BP: 129/84 120/90  Pulse: 73 75  Resp:  20  Temp:  98.6 F (37 C)  SpO2: 98% 98%    DISCHARGE MEDICATION: Allergies as of 04/14/2021   Not on File      Medication List     TAKE these medications    aspirin 81 MG EC tablet Take 1 tablet by mouth daily.   Eliquis DVT/PE Starter Pack Generic drug: Apixaban Starter Pack (10mg  and 5mg ) Take as  directed on package: start with two-5mg  tablets twice daily for 7 days. On day 8, switch to one-5mg  tablet twice daily.   hydrochlorothiazide 25 MG tablet Commonly known as: HYDRODIURIL Take 25 mg by mouth daily.   nicotine 21 mg/24hr patch Commonly known as: NICODERM CQ - dosed in mg/24 hours Place 1 patch (21 mg total) onto the skin daily. Start taking on: April 15, 2021       Not on File Discharge Instructions     Call MD for:   Complete by: As directed    GI bleeding or bruises   Call MD for:  persistant dizziness or light-headedness   Complete by: As directed    Call MD for:  persistant nausea and vomiting   Complete by: As directed    Call MD for:  severe uncontrolled pain   Complete by: As directed    Diet - low sodium heart healthy   Complete by: As directed    Discharge instructions   Complete by: As directed    Follow with PCP in 1 week, patient needs a referral for thyroid nodule biopsy, repeat thyroid sonogram after 1 year Follow-up with hematologist for hypercoagulable work-up and duration of anticoagulation. Risk and benefit of anticoagulation discussed, patient was advised to return back to ED if any signs of bleeding or bruises.   Increase activity slowly   Complete by: As directed  The results of significant diagnostics from this hospitalization (including imaging, microbiology, ancillary and laboratory) are listed below for reference.    Significant Diagnostic Studies: DG Chest 2 View  Result Date: 04/12/2021 CLINICAL DATA:  Shortness of breath. EXAM: CHEST - 2 VIEW COMPARISON:  None. FINDINGS: Normal heart size and cardiomediastinal contours. No pleural effusion or edema. No airspace opacities. Chronic bronchitic changes noted bilaterally. Left lower lung nodule is identified measuring 1.3 cm. Indeterminate. Visualized osseous structures are unremarkable. IMPRESSION: 1. No acute cardiopulmonary abnormalities. 2. Left lower lung nodule. Recommend  further evaluation with nonemergent CT of the chest. Electronically Signed   By: Signa Kell M.D.   On: 04/12/2021 01:23   CT Angio Chest PE W/Cm &/Or Wo Cm  Result Date: 04/12/2021 CLINICAL DATA:  Chest pain. Rule out pulmonary embolus. Shortness of breath for 2 days. EXAM: CT ANGIOGRAPHY CHEST WITH CONTRAST TECHNIQUE: Multidetector CT imaging of the chest was performed using the standard protocol during bolus administration of intravenous contrast. Multiplanar CT image reconstructions and MIPs were obtained to evaluate the vascular anatomy. CONTRAST:  26mL OMNIPAQUE IOHEXOL 350 MG/ML SOLN COMPARISON:  None. FINDINGS: Cardiovascular: Satisfactory opacification of the pulmonary arteries to the segmental level. There is a filling defect extending from the left upper lobar pulmonary artery into the segmental branches compatible with acute pulmonary embolus, image 45/7. Filling defects within the segmental pulmonary artery to the lingula is also identified, image 176/5. No signs of right heart strain. Heart size is normal. Aortic atherosclerosis. No pericardial effusion. Mediastinum/Nodes: No enlarged mediastinal, hilar, or axillary lymph nodes. Trachea and esophagus demonstrate no significant findings. 2.3 cm nodule in left lobe of thyroid gland is identified, image 31/9. Recommend thyroid US (ref: J Am Coll Radiol. 2015 Feb;12(2): 143-50). No enlarged axillary, supraclavicular, mediastinal or hilar lymph nodes. Lungs/Pleura: No pleural effusion. Paraseptal and centrilobular emphysema with diffuse bronchial wall thickening. Scattered lung nodules are noted, including: -Nodule within the paramediastinal left upper lobe measures 6 mm, image 51/6. -Left lower lobe lung nodule measures 5 mm, image 71/6. -Also in the left lower lobe is a 4 mm nodule, image 54/6. -Subpleural nodule within the lingula measures 5 mm, image 44/6. -Within the periphery of the right lower lobe there is a 6 mm lung nodule, image 58/6.  -Subpleural nodule in the right middle lobe measures 4 mm, image 58/6. No airspace consolidation, atelectasis, or pneumothorax. Upper Abdomen: No acute abnormality. Several kidney cysts are noted. The largest is in the upper pole of right kidney measuring 3 cm. 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 No acute or suspicious osseous findings identified. Review of the MIP images confirms the above findings. IMPRESSION: 1. Examination is positive for acute pulmonary emboli within the left upper lobar pulmonary artery and segmental branch to the lingula. 2. Multiple, bilateral lung nodules are identified measuring up to 6 mm. Non-contrast chest CT at 3-6 months is recommended. If the nodules are stable at time of repeat CT, then future CT at 18-24 months (from today's scan) is considered optional for low-risk patients, but is recommended for high-risk patients. This recommendation follows the consensus statement: Guidelines for Management of Incidental Pulmonary Nodules Detected on CT Images: From the Fleischner Society 2017; Radiology 2017; 284:228-243. 3. Diffuse bronchial wall thickening with emphysema, as above; imaging findings suggestive of underlying COPD. 4. 2.3 cm left lobe of thyroid gland nodule. Recommend thyroid US (ref: J Am Coll Radiol. 2015 Feb;12(2): 143-50). Aortic Atherosclerosis (ICD10-I70.0) and Emphysema (ICD10-J43.9). Critical Value/emergent results  were called by telephone at the time of interpretation on 04/12/2021 at 3:58 am to provider JADE SUNG , who verbally acknowledged these results. Electronically Signed   By: Signa Kell M.D.   On: 04/12/2021 03:58   US Venous Img Lower Bilateral (DVT)  Result Date: 04/12/2021 CLINICAL DATA:  70 year old male with PE EXAM: BILATERAL LOWER EXTREMITY VENOUS DOPPLER ULTRASOUND TECHNIQUE: Gray-scale sonography with graded compression, as well as color Doppler and duplex ultrasound were performed to evaluate the  lower extremity deep venous systems from the level of the common femoral vein and including the common femoral, femoral, profunda femoral, popliteal and calf veins including the posterior tibial, peroneal and gastrocnemius veins when visible. The superficial great saphenous vein was also interrogated. Spectral Doppler was utilized to evaluate flow at rest and with distal augmentation maneuvers in the common femoral, femoral and popliteal veins. COMPARISON:  None. FINDINGS: RIGHT LOWER EXTREMITY Common Femoral Vein: No evidence of thrombus. Normal compressibility, respiratory phasicity and response to augmentation. Saphenofemoral Junction: No evidence of thrombus. Normal compressibility and flow on color Doppler imaging. Profunda Femoral Vein: No evidence of thrombus. Normal compressibility and flow on color Doppler imaging. Femoral Vein: No evidence of thrombus. Normal compressibility, respiratory phasicity and response to augmentation. Popliteal Vein: No evidence of thrombus. Normal compressibility, respiratory phasicity and response to augmentation. Calf Veins: No evidence of thrombus. Normal compressibility and flow on color Doppler imaging. Superficial Great Saphenous Vein: No evidence of thrombus. Normal compressibility and flow on color Doppler imaging. Other Findings:  None. LEFT LOWER EXTREMITY Common Femoral Vein: No evidence of thrombus. Normal compressibility, respiratory phasicity and response to augmentation. Saphenofemoral Junction: No evidence of thrombus. Normal compressibility and flow on color Doppler imaging. Profunda Femoral Vein: No evidence of thrombus. Normal compressibility and flow on color Doppler imaging. Femoral Vein: Proximal and mid femoral vein compressible with flow maintained. Incompletely compressible distal femoral vein with flow maintained. Incompressible vein extends into the popliteal vein. Popliteal Vein: Incompressible popliteal vein with flow maintained Calf Veins: No  evidence of thrombus. Normal compressibility and flow on color Doppler imaging. Superficial Great Saphenous Vein: No evidence of thrombus. Normal compressibility and flow on color Doppler imaging. Other Findings:  None. IMPRESSION: Sonographic survey of the left lower extremity positive for nonocclusive DVT of the distal femoral vein and popliteal vein. Sonographic survey of the right lower extremity negative for DVT Electronically Signed   By: Gilmer Mor D.O.   On: 04/12/2021 11:46   US THYROID  Result Date: 04/14/2021 CLINICAL DATA:  Incidental on CT. EXAM: THYROID ULTRASOUND TECHNIQUE: Ultrasound examination of the thyroid gland and adjacent soft tissues was performed. COMPARISON:  Chest CT from 04/12/2021 FINDINGS: Parenchymal Echotexture: Mildly heterogenous Isthmus: 0.6 cm Right lobe: 5.2 x 2.0 x 1.9 cm Left lobe: 5.3 x 2.7 x 2.6 cm _________________________________________________________ Estimated total number of nodules >/= 1 cm: 5 Number of spongiform nodules >/=  2 cm not described below (TR1): 0 Number of mixed cystic and solid nodules >/= 1.5 cm not described below (TR2): 0 _________________________________________________________ Nodule # 1: Location: Isthmus; Mid Maximum size: 0.7 cm; Other 2 dimensions: 0.7 x 0.5 cm Composition: solid/almost completely solid (2) Echogenicity: hypoechoic (2) Shape: not taller-than-wide (0) Margins: smooth (0) Echogenic foci: none (0) ACR TI-RADS total points: 4. ACR TI-RADS risk category: TR4 (4-6 points). ACR TI-RADS recommendations: Given size (<0.9 cm) and appearance, this nodule does NOT meet TI-RADS criteria for biopsy or dedicated follow-up. _________________________________________________________ There is an additional adjacent subcentimeter solid cystic, benign-appearing  nodule (labeled 2) in the isthmus. Nodule # 3: Location: Right; Mid Maximum size: 1.3 cm; Other 2 dimensions: 1.0 x 0.9 cm Composition: mixed cystic and solid (1) Echogenicity:  isoechoic (1) Shape: not taller-than-wide (0) Margins: smooth (0) Echogenic foci: none (0) ACR TI-RADS total points: 2. ACR TI-RADS risk category: TR2 (2 points). ACR TI-RADS recommendations: This nodule does NOT meet TI-RADS criteria for biopsy or dedicated follow-up. _________________________________________________________ There is a subcentimeter benign-appearing spongiform nodule (labeled 4) in the right mid thyroid. Nodule # 5: Location: Left; Superior Maximum size: 1.3 cm; Other 2 dimensions: 1.3 x 0.7 cm Composition: mixed cystic and solid (1) Echogenicity: isoechoic (1) Shape: not taller-than-wide (0) Margins: smooth (0) Echogenic foci: none (0) ACR TI-RADS total points: 2. ACR TI-RADS risk category: TR2 (2 points). ACR TI-RADS recommendations: This nodule does NOT meet TI-RADS criteria for biopsy or dedicated follow-up. _________________________________________________________ Nodule # 6: Location: Left; Mid Maximum size: 2.4 cm; Other 2 dimensions: 1.5 x 1.9 cm Composition: solid/almost completely solid (2) Echogenicity: isoechoic (1) Shape: not taller-than-wide (0) Margins: smooth (0) Echogenic foci: none (0) ACR TI-RADS total points: 3. ACR TI-RADS risk category: TR3 (3 points). ACR TI-RADS recommendations: *Given size (>/= 1.5 - 2.4 cm) and appearance, a follow-up ultrasound in 1 year should be considered based on TI-RADS criteria. _________________________________________________________ Nodule # 7: Location: Left; Inferior Maximum size: 2.0 cm; Other 2 dimensions: 1.8 x 1.0 cm Composition: solid/almost completely solid (2) Echogenicity: isoechoic (1) Shape: not taller-than-wide (0) Margins: smooth (0) Echogenic foci: none (0) ACR TI-RADS total points: 3. ACR TI-RADS risk category: TR3 (3 points). ACR TI-RADS recommendations: *Given size (>/= 1.5 - 2.4 cm) and appearance, a follow-up ultrasound in 1 year should be considered based on TI-RADS criteria.  _________________________________________________________ Nodule # 8: Location: Left; Inferior Maximum size: 1.8 cm; Other 2 dimensions: 1.6 x 1.0 cm Composition: solid/almost completely solid (2) Echogenicity: hypoechoic (2) Shape: not taller-than-wide (0) Margins: smooth (0) Echogenic foci: none (0) ACR TI-RADS total points: 4. ACR TI-RADS risk category: TR4 (4-6 points). ACR TI-RADS recommendations: **Given size (>/= 1.5 cm) and appearance, fine needle aspiration of this moderately suspicious nodule should be considered based on TI-RADS criteria. _________________________________________________________ No cervical lymphadenopathy. IMPRESSION: 1. Multinodular goiter. 2. Solid nodule in the left inferior thyroid (labeled 8, 1.8 cm) meets criteria (TI-RADS category 4) for tissue sampling. Recommend ultrasound-guided fine-needle aspiration. 3. Solid nodules in the left mid (labeled 6, 2.4 cm) and left inferior (labeled 7, 2.0 cm) meet criteria (TI-RADS category 3) for 1 year ultrasound follow-up. 4. The remaining visualized thyroid nodules appear benign and do not warrant additional ultrasound follow-up or tissue sampling. The above is in keeping with the ACR TI-RADS recommendations - J Am Coll Radiol 2017;14:587-595. Marliss Coots, MD [facility] Electronically Signed   By: Marliss Coots MD   On: 04/14/2021 09:29    Microbiology: Recent Results (from the past 240 hour(s))  Resp Panel by RT-PCR (Flu A&B, Covid) Nasopharyngeal Swab     Status: None   Collection Time: 04/12/21  4:16 AM   Specimen: Nasopharyngeal Swab; Nasopharyngeal(NP) swabs in vial transport medium  Result Value Ref Range Status   SARS Coronavirus 2 by RT PCR NEGATIVE NEGATIVE Final    Comment: (NOTE) SARS-CoV-2 target nucleic acids are NOT DETECTED.  The SARS-CoV-2 RNA is generally detectable in upper respiratory specimens during the acute phase of infection. The  lowest concentration of SARS-CoV-2 viral copies this assay can detect is 138 copies/mL. A negative  result does not preclude SARS-Cov-2 infection and should not be used as the sole basis for treatment or other patient management decisions. A negative result may occur with  improper specimen collection/handling, submission of specimen other than nasopharyngeal swab, presence of viral mutation(s) within the areas targeted by this assay, and inadequate number of viral copies(<138 copies/mL). A negative result must be combined with clinical observations, patient history, and epidemiological information. The expected result is Negative.  Fact Sheet for Patients:  BloggerCourse.com  Fact Sheet for Healthcare Providers:  SeriousBroker.it  This test is no t yet approved or cleared by the [place] FDA and  has been authorized for detection and/or diagnosis of SARS-CoV-2 by FDA under an Emergency Use Authorization (EUA). This EUA will remain  in effect (meaning this test can be used) for the duration of the COVID-19 declaration under Section 564(b)(1) of the Act, 21 U.S.C.section 360bbb-3(b)(1), unless the authorization is terminated  or revoked sooner.       Influenza A by PCR NEGATIVE NEGATIVE Final   Influenza B by PCR NEGATIVE NEGATIVE Final    Comment: (NOTE) The Xpert Xpress SARS-CoV-2/FLU/RSV plus assay is intended as an aid in the diagnosis of influenza from Nasopharyngeal swab specimens and should not be used as a sole basis for treatment. Nasal washings and aspirates are unacceptable for Xpert Xpress SARS-CoV-2/FLU/RSV testing.  Fact Sheet for Patients: BloggerCourse.com  Fact Sheet for Healthcare Providers: SeriousBroker.it  This test is not yet approved or cleared by the [place] FDA and has been authorized for detection and/or diagnosis of SARS-CoV-2 by FDA under  an Emergency Use Authorization (EUA). This EUA will remain in effect (meaning this test can be used) for the duration of the COVID-19 declaration under Section 564(b)(1) of the Act, 21 U.S.C. section 360bbb-3(b)(1), unless the authorization is terminated or revoked.  Performed at [facility], [address] [place], [place] [address]      Labs: CBC: Recent Labs  Lab 04/12/21 0034 04/13/21 0538 04/14/21 0605  WBC 13.3* 7.1 7.0  HGB 16.5 15.3 15.5  HCT 48.8 46.5 45.9  MCV 82.3 83.3 82.6  PLT 222 181 193   Basic Metabolic Panel: Recent Labs  Lab 04/12/21 0034 04/12/21 1019 04/14/21 0605  NA 137  --  139  K 3.1*  --  3.4*  CL 102  --  106  CO2 28  --  26  GLUCOSE 124*  --  109*  BUN 17  --  14  CREATININE 1.09  --  0.95  CALCIUM 9.0  --  8.9  MG  --  2.0 2.3  PHOS  --   --  3.2   Liver Function Tests: Recent Labs  Lab 04/12/21 0034  AST 25  ALT 32  ALKPHOS 91  BILITOT 0.6  PROT 7.8  ALBUMIN 4.0   No results for input(s): LIPASE, AMYLASE in the last 168 hours. No results for input(s): AMMONIA in the last 168 hours. Cardiac Enzymes: No results for input(s): CKTOTAL, CKMB, CKMBINDEX, TROPONINI in the last 168 hours. BNP (last 3 results) No results for input(s): BNP in the last 8760 hours. CBG: No results for input(s): GLUCAP in the last 168 hours.  Time spent: 35 minutes  Signed:  Gillis Santa  [facility]  04/14/2021 12:16 PM

## 2021-04-14 NOTE — Progress Notes (Signed)
ANTICOAGULATION CONSULT NOTE - Initial Consult  Pharmacy Consult for Heparin  Indication: pulmonary embolus  Not on File  Patient Measurements: Height: 5\' 11"  (180.3 cm) Weight: 77.8 kg (171 lb 9.6 oz) IBW/kg (Calculated) : 75.3 Heparin Dosing Weight: 72.6 kg   Vital Signs: Temp: 98.2 F (36.8 C) (06/23 0519) Temp Source: Oral (06/23 0519) BP: 111/78 (06/23 0519) Pulse Rate: 61 (06/23 0519)  Labs: Recent Labs    04/12/21 0034 04/12/21 0223 04/12/21 0416 04/12/21 1019 04/13/21 0538 04/13/21 1559 04/13/21 2145 04/14/21 0605  HGB 16.5  --   --   --  15.3  --   --  15.5  HCT 48.8  --   --   --  46.5  --   --  45.9  PLT 222  --   --   --  181  --   --  193  APTT  --   --  31  --   --   --   --   --   LABPROT  --   --  13.5  --   --   --   --   --   INR  --   --  1.0  --   --   --   --   --   HEPARINUNFRC  --   --   --    < > 0.71* 0.58 0.46 0.45  CREATININE 1.09  --   --   --   --   --   --   --   TROPONINIHS 18* 17  --   --   --   --   --   --    < > = values in this interval not displayed.     Estimated Creatinine Clearance: 67.2 mL/min (by C-G formula based on SCr of 1.09 mg/dL).   Medical History: Past Medical History:  Diagnosis Date   HTN (hypertension)    Tobacco abuse     Medications:  Medications Prior to Admission  Medication Sig Dispense Refill Last Dose   aspirin 81 MG EC tablet Take 1 tablet by mouth daily.   unknown at unknown   hydrochlorothiazide (HYDRODIURIL) 25 MG tablet Take 25 mg by mouth daily.   unknownu at [facility]    Assessment: Pharmacy consulted to dose heparin in this 70 yo male admitted with PE.   No prior anticoag noted.  CrCl = 64.8 ml/min   6/21@1019 : HL 0.33, therapeutic x1 6/22@0538 : HL 0.71, supratherapeutic  6/22 @1600 : HL 0.58, therapeutic  6/23 @ 0605: HL 0.45, therapeutic X 2   Goal of Therapy:  Heparin level 0.3-0.7 units/ml Monitor platelets by anticoagulation protocol: Yes   Plan:  Heparin therapeutic.   Will continue heparin drip at 1200 units/hr Will recheck HL on 6/24 with AM labs.  CBC daily   San Lohmeyer D, PharmD Clinical Pharmacist   04/14/2021,6:40 AM

## 2021-04-14 NOTE — Progress Notes (Signed)
CSW consulted for Eliquis card at discharge, CSW provided 30 day card to patient at bedside. Patient inquired as to future costs of medication after use of card, CSW confirmed patient has insurance. Patient encouraged to go to [email or web address] to compare costs of medications at different pharmacies and available coupons, patient expressed understanding.   No further discharge needs identified at this time.   [place], [place] [phone]

## 2021-04-27 ENCOUNTER — Observation Stay: Payer: No Typology Code available for payment source

## 2021-04-27 ENCOUNTER — Other Ambulatory Visit: Payer: Self-pay

## 2021-04-27 ENCOUNTER — Emergency Department (HOSPITAL_BASED_OUTPATIENT_CLINIC_OR_DEPARTMENT_OTHER)
Admit: 2021-04-27 | Discharge: 2021-04-27 | Disposition: A | Discharge status: Home / Self Care | Payer: No Typology Code available for payment source | Attending: Neurology | Admitting: Neurology

## 2021-04-27 ENCOUNTER — Observation Stay
Admission: EM | Admit: 2021-04-27 | Discharge: 2021-04-28 | Disposition: A | Discharge status: Home / Self Care | Payer: No Typology Code available for payment source | Attending: Internal Medicine | Admitting: Internal Medicine

## 2021-04-27 ENCOUNTER — Emergency Department: Payer: No Typology Code available for payment source

## 2021-04-27 ENCOUNTER — Emergency Department
Admit: 2021-04-27 | Discharge: 2021-04-27 | Disposition: A | Discharge status: Home / Self Care | Payer: No Typology Code available for payment source | Attending: Neurology | Admitting: Neurology

## 2021-04-27 DIAGNOSIS — F17210 Nicotine dependence, cigarettes, uncomplicated: Secondary | ICD-10-CM | POA: Diagnosis not present

## 2021-04-27 DIAGNOSIS — G459 Transient cerebral ischemic attack, unspecified: Secondary | ICD-10-CM | POA: Diagnosis not present

## 2021-04-27 DIAGNOSIS — E876 Hypokalemia: Secondary | ICD-10-CM | POA: Diagnosis not present

## 2021-04-27 DIAGNOSIS — I6300 Cerebral infarction due to thrombosis of unspecified precerebral artery: Secondary | ICD-10-CM | POA: Diagnosis not present

## 2021-04-27 DIAGNOSIS — Z79899 Other long term (current) drug therapy: Secondary | ICD-10-CM | POA: Insufficient documentation

## 2021-04-27 DIAGNOSIS — I82409 Acute embolism and thrombosis of unspecified deep veins of unspecified lower extremity: Secondary | ICD-10-CM | POA: Diagnosis present

## 2021-04-27 DIAGNOSIS — U071 COVID-19: Secondary | ICD-10-CM | POA: Insufficient documentation

## 2021-04-27 DIAGNOSIS — I824Z3 Acute embolism and thrombosis of unspecified deep veins of distal lower extremity, bilateral: Secondary | ICD-10-CM | POA: Diagnosis not present

## 2021-04-27 DIAGNOSIS — Z7901 Long term (current) use of anticoagulants: Secondary | ICD-10-CM | POA: Insufficient documentation

## 2021-04-27 DIAGNOSIS — N400 Enlarged prostate without lower urinary tract symptoms: Secondary | ICD-10-CM

## 2021-04-27 DIAGNOSIS — Z86711 Personal history of pulmonary embolism: Secondary | ICD-10-CM | POA: Insufficient documentation

## 2021-04-27 DIAGNOSIS — Z7982 Long term (current) use of aspirin: Secondary | ICD-10-CM | POA: Insufficient documentation

## 2021-04-27 DIAGNOSIS — I10 Essential (primary) hypertension: Secondary | ICD-10-CM | POA: Diagnosis not present

## 2021-04-27 DIAGNOSIS — M5412 Radiculopathy, cervical region: Secondary | ICD-10-CM

## 2021-04-27 DIAGNOSIS — I2699 Other pulmonary embolism without acute cor pulmonale: Secondary | ICD-10-CM | POA: Diagnosis present

## 2021-04-27 DIAGNOSIS — R200 Anesthesia of skin: Secondary | ICD-10-CM | POA: Diagnosis present

## 2021-04-27 LAB — ECHOCARDIOGRAM COMPLETE BUBBLE STUDY
AR max vel: 2.25 cm2
AV Area VTI: 2.97 cm2
AV Area mean vel: 2.41 cm2
AV Mean grad: 2 mmHg
AV Peak grad: 4.2 mmHg
Ao pk vel: 1.02 m/s
Area-P 1/2: 2.71 cm2
S' Lateral: 3.02 cm

## 2021-04-27 LAB — LDL CHOLESTEROL, DIRECT: Direct LDL: 114 mg/dL — ABNORMAL HIGH (ref 0–99)

## 2021-04-27 LAB — COMPREHENSIVE METABOLIC PANEL
ALT: 70 U/L — ABNORMAL HIGH (ref 0–44)
AST: 26 U/L (ref 15–41)
Albumin: 3.7 g/dL (ref 3.5–5.0)
Alkaline Phosphatase: 69 U/L (ref 38–126)
Anion gap: 9 (ref 5–15)
BUN: 11 mg/dL (ref 8–23)
CO2: 25 mmol/L (ref 22–32)
Calcium: 9.4 mg/dL (ref 8.9–10.3)
Chloride: 103 mmol/L (ref 98–111)
Creatinine, Ser: 0.92 mg/dL (ref 0.61–1.24)
GFR, Estimated: >60 mL/min (ref >60)
Glucose, Bld: 118 mg/dL — ABNORMAL HIGH (ref 70–99)
Potassium: 3.2 mmol/L — ABNORMAL LOW (ref 3.5–5.1)
Sodium: 137 mmol/L (ref 135–145)
Total Bilirubin: 0.5 mg/dL (ref 0.3–1.2)
Total Protein: 7.2 g/dL (ref 6.5–8.1)

## 2021-04-27 LAB — CBC
HCT: 49.4 % (ref 39.0–52.0)
Hemoglobin: 16.3 g/dL (ref 13.0–17.0)
MCH: 27.4 pg (ref 26.0–34.0)
MCHC: 33 g/dL (ref 30.0–36.0)
MCV: 83.2 fL (ref 80.0–100.0)
Platelets: 190 10*3/uL (ref 150–400)
RBC: 5.94 MIL/uL — ABNORMAL HIGH (ref 4.22–5.81)
RDW: 15.9 % — ABNORMAL HIGH (ref 11.5–15.5)
WBC: 7 10*3/uL (ref 4.0–10.5)
nRBC: 0 % (ref 0.0–0.2)

## 2021-04-27 LAB — PROTIME-INR
INR: 1.1 (ref 0.8–1.2)
Prothrombin Time: 13.9 seconds (ref 11.4–15.2)

## 2021-04-27 LAB — DIFFERENTIAL
Abs Immature Granulocytes: 0.02 10*3/uL (ref 0.00–0.07)
Basophils Absolute: 0.1 10*3/uL (ref 0.0–0.1)
Basophils Relative: 1 %
Eosinophils Absolute: 0.1 10*3/uL (ref 0.0–0.5)
Eosinophils Relative: 2 %
Immature Granulocytes: 0 %
Lymphocytes Relative: 26 %
Lymphs Abs: 1.8 10*3/uL (ref 0.7–4.0)
Monocytes Absolute: 0.4 10*3/uL (ref 0.1–1.0)
Monocytes Relative: 6 %
Neutro Abs: 4.5 10*3/uL (ref 1.7–7.7)
Neutrophils Relative %: 65 %

## 2021-04-27 LAB — HEMOGLOBIN A1C
Hgb A1c MFr Bld: 6.4 % — ABNORMAL HIGH (ref 4.8–5.6)
Mean Plasma Glucose: 136.98 mg/dL

## 2021-04-27 LAB — APTT: aPTT: 32 seconds (ref 24–36)

## 2021-04-27 LAB — CBG MONITORING, ED: Glucose-Capillary: 163 mg/dL — ABNORMAL HIGH (ref 70–99)

## 2021-04-27 MED ORDER — ATORVASTATIN CALCIUM 20 MG PO TABS
40.0000 mg | ORAL_TABLET | Freq: Every day | ORAL | Status: DC
  Administered 2021-04-27 – 2021-04-28 (×2): 40 mg via ORAL
  Filled 2021-04-27 (×2): qty 2

## 2021-04-27 MED ORDER — ACETAMINOPHEN 325 MG PO TABS: 650.0000 mg | ORAL_TABLET | ORAL | Status: DC | PRN

## 2021-04-27 MED ORDER — ACETAMINOPHEN 650 MG RE SUPP: 650.0000 mg | RECTAL | Status: DC | PRN

## 2021-04-27 MED ORDER — POTASSIUM CHLORIDE CRYS ER 20 MEQ PO TBCR
40.0000 meq | EXTENDED_RELEASE_TABLET | Freq: Once | ORAL | Status: AC
  Administered 2021-04-27: 40 meq via ORAL
  Filled 2021-04-27: qty 2

## 2021-04-27 MED ORDER — GADOBUTROL 1 MMOL/ML IV SOLN
7.5000 mL | Freq: Once | INTRAVENOUS | Status: AC | PRN
  Administered 2021-04-27: 7.5 mL via INTRAVENOUS

## 2021-04-27 MED ORDER — NICOTINE 21 MG/24HR TD PT24
21.0000 mg | MEDICATED_PATCH | Freq: Every day | TRANSDERMAL | Status: DC
  Administered 2021-04-28: 21 mg via TRANSDERMAL
  Filled 2021-04-27 (×2): qty 1

## 2021-04-27 MED ORDER — ASPIRIN EC 81 MG PO TBEC: 81.0000 mg | DELAYED_RELEASE_TABLET | Freq: Every day | ORAL | Status: DC

## 2021-04-27 MED ORDER — SODIUM CHLORIDE 0.9% FLUSH: 3.0000 mL | Freq: Once | INTRAVENOUS | Status: DC

## 2021-04-27 MED ORDER — HYDROCHLOROTHIAZIDE 25 MG PO TABS
25.0000 mg | ORAL_TABLET | Freq: Every day | ORAL | Status: DC
  Administered 2021-04-28: 25 mg via ORAL
  Filled 2021-04-27: qty 1

## 2021-04-27 MED ORDER — APIXABAN (ELIQUIS) VTE STARTER PACK (10MG AND 5MG): 5.0000 mg | ORAL_TABLET | Freq: Two times a day (BID) | ORAL | Status: DC

## 2021-04-27 MED ORDER — ACETAMINOPHEN 160 MG/5ML PO SOLN
650.0000 mg | ORAL | Status: DC | PRN
  Filled 2021-04-27: qty 20.3

## 2021-04-27 MED ORDER — STROKE: EARLY STAGES OF RECOVERY BOOK: Freq: Once | Status: DC

## 2021-04-27 MED ORDER — APIXABAN 5 MG PO TABS
5.0000 mg | ORAL_TABLET | Freq: Two times a day (BID) | ORAL | Status: DC
  Administered 2021-04-28: 5 mg via ORAL
  Filled 2021-04-27: qty 1

## 2021-04-27 NOTE — ED Notes (Signed)
Code Stroke Called to [employer]

## 2021-04-27 NOTE — Consult Note (Signed)
NEUROLOGY CONSULTATION NOTE   Date of service: April 27, 2021 Patient Name: Douglas Smith MRN:  [id number] DOB:  1951/07/24 Reason for consult: stroke code acute onset R sided numbness Requesting physician: Dorothea Glassman MD _ _ _   _ __   _ __ _ _  __ __   _ __   __ _  History of Present Illness   A 70 year old gentleman with history of hypertension, tobacco abuse, emphysema, lung nodules on recent CT chest, recent DVT/PE 2 weeks ago on Eliquis with last dose this morning who presented with acute onset of right-sided numbness in face arm and leg as well as right facial droop.  Patient reports that his last known normal was 9:30 AM this morning.  He was not a tPA candidate.  Therapeutic anticoagulation.  CT head showed no acute intracranial process; ?punctate hypodensity L thalamus age indeterminate.  NIH stroke scale was 2 for right-sided sensory deficit and mild right facial droop.  He had no weakness or other focal neurologic deficits. CTA was not performed 2/2 exam not c/w LVO.  He also takes aspirin 81 mg daily.  He states that he was instructed to do so and this was also in his discharge instructions from the hospitalization 2 weeks ago however I cannot identify why this was started.   ROS   Per HPI. All other sx reviewed and were negative.  Past History   Past Medical History:  Diagnosis Date  . HTN (hypertension)   . Tobacco abuse    Past Surgical History:  Procedure Laterality Date  . left shoulder surgery Left    Family History  Problem Relation Age of Onset  . Stroke Brother    Social History   Socioeconomic History  . Marital status: Single    Spouse name: Not on file  . Number of children: Not on file  . Years of education: Not on file  . Highest education level: Not on file  Occupational History  . Not on file  Tobacco Use  . Smoking status: Every Day    Pack years: 0.00    Types: Cigarettes  . Smokeless tobacco: Never  Substance and Sexual Activity  .  Alcohol use: Not Currently  . Drug use: Never  . Sexual activity: Not on file  Other Topics Concern  . Not on file  Social History Narrative  . Not on file   Social Determinants of Health   Financial Resource Strain: Not on file  Food Insecurity: Not on file  Transportation Needs: Not on file  Physical Activity: Not on file  Stress: Not on file  Social Connections: Not on file   Not on File  Medications   Current Outpatient Medications  Medication Instructions  . Apixaban Starter Pack, 10mg  and 5mg , (ELIQUIS DVT/PE STARTER PACK) Take as directed on package: start with two-5mg  tablets twice daily for 7 days. On day 8, switch to one-5mg  tablet twice daily.  aspirin 81 mg, Oral, Daily  . hydrochlorothiazide (HYDRODIURIL) 25 mg, Oral, Daily  . nicotine (NICODERM CQ - DOSED IN MG/24 HOURS) 21 mg, Transdermal, Daily      Vitals   Vitals:   04/27/21 1023 04/27/21 1045  BP: (!) 127/95 (!) 145/96  Pulse: 95 81  Resp: 18 20  Temp: 98 F (36.7 C)   TempSrc: Oral   SpO2: 98% 100%  Weight:  75.8 kg  Height:  5\' 11"  (1.803 m)     Body mass index is 23.29 kg/m.  Physical Exam   Physical Exam Gen: A&O x4, NAD HEENT: Atraumatic, normocephalic;mucous membranes moist; oropharynx clear, tongue without atrophy or fasciculations. Neck: Supple, trachea midline. Resp: CTAB, no w/r/r CV: RRR, no m/g/r; nml S1 and S2. 2+ symmetric peripheral pulses. Abd: soft/NT/ND; nabs x 4 quad Extrem: Nml bulk; no cyanosis, clubbing, or edema.  Neuro: *MS: A&O x4. Follows multi-step commands.  *Speech: fluid, nondysarthric, able to name and repeat *CN:    I: Deferred   II,III: PERRLA, VFF by confrontation, UTA optic discs 2/2 pupillary constriction   III,IV,VI: EOMI w/o nystagmus, no ptosis   V: impaired to LT R V2   VII: Eyelid closure was full.  Mild R UMN facial droop   VIII: Hearing intact to voice   IX,X: Voice normal, palate elevates symmetrically    XI: SCM/trap 5/5 bilat    XII: Tongue protrudes midline, no atrophy or fasciculations   *Motor:   Normal bulk.  No tremor, rigidity or bradykinesia. No pronator drift.    Strength: Dlt Bic Tri WrE WrF FgS Gr HF KnF KnE PlF DoF    Left 5 5 5 5 5 5 5 5 5 5 5 5     Right 5 5 5 5 5 5 5 5 5 5 5 5     *Sensory: Impaired to LT RUE and RLE. Propioception intact bilat.  No double-simultaneous extinction.  *Coordination:  Finger-to-nose, heel-to-shin, rapid alternating motions were intact. *Reflexes:  2+ and symmetric throughout without clonus; toes down-going bilat *Gait: deferred  NIHSS = 2 (1 facial droop, 1 sensory deficit)   Premorbid mRS = 1   Labs   CBC:  Recent Labs  Lab 04/27/21 1048  WBC 7.0  NEUTROABS 4.5  HGB 16.3  HCT 49.4  MCV 83.2  PLT 190    Basic Metabolic Panel:  Lab Results  Component Value Date   NA 139 04/14/2021   K 3.4 (L) 04/14/2021   CO2 26 04/14/2021   GLUCOSE 109 (H) 04/14/2021   BUN 14 04/14/2021   CREATININE 0.95 04/14/2021   CALCIUM 8.9 04/14/2021   GFRNONAA >60 04/14/2021   Lipid Panel: No results found for: LDLCALC HgbA1c: No results found for: HGBA1C Urine Drug Screen: No results found for: LABOPIA, COCAINSCRNUR, LABBENZ, AMPHETMU, THCU, LABBARB  Alcohol Level No results found for: [facility]   Impression   A 70 year old gentleman with history of hypertension, tobacco abuse, emphysema, lung nodules on recent CT chest, recent DVT/PE 2 weeks ago on Eliquis with last dose this morning who presented with acute onset of right-sided numbness in face arm and leg as well as right facial droop c/f TIA vs small acute infarct. Head CT NAICP. Not a tPA candidate 2/2 on eliquis.  Recommendations   - Admit to hospitalist service for stroke w/u - Permissive HTN x48 hrs from sx onset or until stroke ruled out by MRI goal BP <220/110. PRN labetalol or hydralazine if BP above these parameters. Avoid oral antihypertensives. - MRI brain wo contrast - MRA H&N - TTE  - Continue eliquis  5mg  bid. If he has had an acute infarct it is very small and risk of holding anticoagulation in setting of new PE 2 wks ago exceeds risk of hemorrhagic conversion of acute infarct if [facility] continued. - If there is no indication for ASA in addition to eliquis please discontinue ASA (I could not find reason in chart and patient was unsure) - q4 hr neuro checks - STAT head CT for any change in neuro exam -  Tele - PT/OT/SLP - Stroke education - Amb referral to neurology upon discharge   Will continue to follow.  ______________________________________________________________________   Thank you for the opportunity to take part in the care of this patient. If you have any further questions, please contact the neurology consultation attending.  Signed,  Bing Neighbors, MD [facility] [phone]  If 7pm- 7am, please page neurology on call as listed in AMION.

## 2021-04-27 NOTE — ED Provider Notes (Addendum)
[facility] Emergency Department Provider Note   ____________________________________________   Event Date/Time   First MD Initiated Contact with Patient 04/27/21 1052     (approximate)  I have reviewed the triage vital signs and the nursing notes.   HISTORY  Chief Complaint Numbness    HPI Douglas Smith is a 70 y.o. male with numbness starting at about 10 AM this morning.  Last known well was 930.  Patient is on Eliquis recently had a left leg DVT with PEs.  Patient was a code stroke seen by neurology and CT noted to have slight right facial droop and right-sided numbness.  Not felt to be a tPA candidate.  Patient's symptoms had 145 are almost completely resolved.  He noted a palpable cord behind the right knee.  This is new.  We will get an ultrasound to evaluate for DVT.  Patient is a [place] patient but reports he does not want to go to the [place] he wants to stay here.  I discussed with him and his daughter that if he signed the form being willing to be transferred to [place] would take care of the bill if they did not have any beds there.  This is indeed the case.  [facility] He wants to stay here and he has other insurance.  Patient additionally reports he has not smoked for 2 weeks.        Past Medical History:  Diagnosis Date   HTN (hypertension)    Tobacco abuse     Patient Active Problem List   Diagnosis Date Noted   TIA (transient ischemic attack) 04/27/2021   Acute pulmonary embolism (HCC) 04/12/2021   Tobacco abuse 04/12/2021   DVT (deep venous thrombosis) (HCC) 04/12/2021   HTN (hypertension)    Lung nodule    Thyroid nodule    Leukocytosis    Hypokalemia    Elevated troponin     Past Surgical History:  Procedure Laterality Date   left shoulder surgery Left     Prior to Admission medications   Medication Sig Start Date End Date Taking? Authorizing Provider  Apixaban Starter Pack, 10mg  and 5mg , (ELIQUIS  DVT/PE STARTER PACK) Take as directed on package: start with two-5mg  tablets twice daily for 7 days. On day 8, switch to one-5mg  tablet twice daily. Patient taking differently: Take 5 mg by mouth 2 (two) times daily. Take as directed on package: start with two-5mg  tablets twice daily for 7 days. On day 8, switch to one-5mg  tablet twice daily. 04/14/21  Yes , MD  aspirin 81 MG EC tablet Take 81 mg by mouth daily. 09/08/10  Yes [provider]  hydrochlorothiazide (HYDRODIURIL) 25 MG tablet Take 25 mg by mouth daily. 06/15/20  Yes [provider]  nicotine (NICODERM CQ - DOSED IN MG/24 HOURS) 21 mg/24hr patch Place 1 patch (21 mg total) onto the skin daily. 04/15/21  Yes 06/17/20, MD    Allergies Patient has no allergy information on record.  Family History  Problem Relation Age of Onset   Stroke Brother     Social History Social History   Tobacco Use   Smoking status: Every Day    Pack years: 0.00    Types: Cigarettes   Smokeless tobacco: Never  Substance Use Topics   Alcohol use: Not Currently   Drug use: Never    Review of Systems  Constitutional: No fever/chills Eyes: No visual changes. ENT: No sore throat. Cardiovascular:  Denies chest pain. Respiratory: Denies shortness of breath. Gastrointestinal: No abdominal pain.  No nausea, no vomiting.  No diarrhea.  No constipation. Genitourinary: Negative for dysuria. Musculoskeletal: Negative for back pain. Skin: Negative for rash. Neurological: See HPI  ____________________________________________   PHYSICAL EXAM:  VITAL SIGNS: ED Triage Vitals  Enc Vitals Group     BP 04/27/21 1023 (!) 127/95     Pulse Rate 04/27/21 1023 95     Resp 04/27/21 1023 18     Temp 04/27/21 1023 98 F (36.7 C)     Temp Source 04/27/21 1023 Oral     SpO2 04/27/21 1023 98 %     Weight 04/27/21 1045 167 lb (75.8 kg)     Height 04/27/21 1045 5\' 11"  (1.803 m)     Head Circumference --      Peak Flow --       Pain Score 04/27/21 1045 5     Pain Loc --      Pain Edu? --      Excl. in GC? --     Constitutional: Alert and oriented. Well appearing and in no acute distress. Eyes: Conjunctivae are normal. PERRL. EOMI. Head: Atraumatic. Nose: No congestion/rhinnorhea. Mouth/Throat: Mucous membranes are moist.  Oropharynx non-erythematous. Neck: No stridor.  Cardiovascular: Normal rate, regular rhythm. Grossly normal heart sounds.  Good peripheral circulation. Respiratory: Normal respiratory effort.  No retractions. Lungs CTAB. Gastrointestinal: Soft and nontender. No distention. No abdominal bruits. No CVA tenderness. Musculoskeletal: No lower extremity tenderness nor edema.  There is a palpable cord behind the right knee which is not one of the tendons. Neurologic:  Normal speech and language. No gross focal neurologic deficits are appreciated except for slight residual right-sided numbness and possible slight drooping of the right cheek.   Skin:  Skin is warm, dry and intact. No rash noted.   ____________________________________________   LABS (all labs ordered are listed, but only abnormal results are displayed)  Labs Reviewed  CBC - Abnormal; Notable for the following components:      Result Value   RBC 5.94 (*)    RDW 15.9 (*)    All other components within normal limits  COMPREHENSIVE METABOLIC PANEL - Abnormal; Notable for the following components:   Potassium 3.2 (*)    Glucose, Bld 118 (*)    ALT 70 (*)    All other components within normal limits  CBG MONITORING, ED - Abnormal; Notable for the following components:   Glucose-Capillary 163 (*)    All other components within normal limits  PROTIME-INR  APTT  DIFFERENTIAL  HEMOGLOBIN A1C  LDL CHOLESTEROL, DIRECT  I-STAT CREATININE, ED   ____________________________________________  EKG  EKG read interpreted by me shows normal sinus rhythm rate of 96 normal axis no acute ST-T wave changes there are some artifact in a few  of the leads. ____________________________________________  RADIOLOGY 06/28/21, personally viewed and evaluated these images (plain radiographs) as part of my medical decision making, as well as reviewing the written report by the radiologist.  ED MD interpretation CT of the head read by radiologist as no acute changes MRI of the same.  Official radiology report(s): MR ANGIO HEAD WO CONTRAST  Result Date: 04/27/2021 CLINICAL DATA:  Right-sided numbness EXAM: MRI HEAD WITHOUT CONTRAST MRA HEAD WITHOUT CONTRAST MRA NECK WITHOUT AND WITH CONTRAST TECHNIQUE: Multiplanar, multi-echo pulse sequences of the brain and surrounding structures were acquired without intravenous contrast. Angiographic images of the Circle of Willis were acquired  using MRA technique without intravenous contrast. Angiographic images of the neck were acquired using MRA technique without and with intravenous contrast. Carotid stenosis measurements (when applicable) are obtained utilizing NASCET criteria, using the distal internal carotid diameter as the denominator. CONTRAST:  7.60mL GADAVIST GADOBUTROL 1 MMOL/ML IV SOLN COMPARISON:  None FINDINGS: MRI HEAD Brain: There is no acute infarction or intracranial hemorrhage. There is no intracranial mass, mass effect, or edema. There is no hydrocephalus or extra-axial fluid collection. Ventricles and sulci are within normal limits in size and configuration. A few small foci of T2 hyperintensity in the supratentorial white matter are nonspecific but could reflect minor chronic microvascular ischemic changes. Vascular: Major vessel flow voids at the skull base are preserved. Skull and upper cervical spine: Normal marrow signal is preserved. Sinuses/Orbits: Paranasal sinuses are aerated. Orbits are unremarkable. Other: Sella is unremarkable.  Mastoid air cells are clear. MRA HEAD Intracranial internal carotid arteries are patent. Middle and anterior cerebral arteries are patent.  Intracranial vertebral arteries, basilar artery, posterior cerebral arteries are patent. There is no significant stenosis or aneurysm. MRA NECK Great vessel origins are patent. There is no proximal subclavian stenosis. Common, internal, and external carotid arteries are patent. Extracranial vertebral arteries are patent. No hemodynamically significant stenosis or evidence of dissection. IMPRESSION: No acute infarction, hemorrhage, or mass. No large vessel occlusion, hemodynamically significant stenosis, or evidence of dissection. Electronically Signed   By: Guadlupe Spanish M.D.   On: 04/27/2021 13:24   MR ANGIO NECK W WO CONTRAST  Result Date: 04/27/2021 CLINICAL DATA:  Right-sided numbness EXAM: MRI HEAD WITHOUT CONTRAST MRA HEAD WITHOUT CONTRAST MRA NECK WITHOUT AND WITH CONTRAST TECHNIQUE: Multiplanar, multi-echo pulse sequences of the brain and surrounding structures were acquired without intravenous contrast. Angiographic images of the Circle of Willis were acquired using MRA technique without intravenous contrast. Angiographic images of the neck were acquired using MRA technique without and with intravenous contrast. Carotid stenosis measurements (when applicable) are obtained utilizing NASCET criteria, using the distal internal carotid diameter as the denominator. CONTRAST:  7.42mL GADAVIST GADOBUTROL 1 MMOL/ML IV SOLN COMPARISON:  None FINDINGS: MRI HEAD Brain: There is no acute infarction or intracranial hemorrhage. There is no intracranial mass, mass effect, or edema. There is no hydrocephalus or extra-axial fluid collection. Ventricles and sulci are within normal limits in size and configuration. A few small foci of T2 hyperintensity in the supratentorial white matter are nonspecific but could reflect minor chronic microvascular ischemic changes. Vascular: Major vessel flow voids at the skull base are preserved. Skull and upper cervical spine: Normal marrow signal is preserved. Sinuses/Orbits: Paranasal  sinuses are aerated. Orbits are unremarkable. Other: Sella is unremarkable.  Mastoid air cells are clear. MRA HEAD Intracranial internal carotid arteries are patent. Middle and anterior cerebral arteries are patent. Intracranial vertebral arteries, basilar artery, posterior cerebral arteries are patent. There is no significant stenosis or aneurysm. MRA NECK Great vessel origins are patent. There is no proximal subclavian stenosis. Common, internal, and external carotid arteries are patent. Extracranial vertebral arteries are patent. No hemodynamically significant stenosis or evidence of dissection. IMPRESSION: No acute infarction, hemorrhage, or mass. No large vessel occlusion, hemodynamically significant stenosis, or evidence of dissection. Electronically Signed   By: Guadlupe Spanish M.D.   On: 04/27/2021 13:24   MR BRAIN WO CONTRAST  Result Date: 04/27/2021 CLINICAL DATA:  Right-sided numbness EXAM: MRI HEAD WITHOUT CONTRAST MRA HEAD WITHOUT CONTRAST MRA NECK WITHOUT AND WITH CONTRAST TECHNIQUE: Multiplanar, multi-echo pulse sequences of the brain and  surrounding structures were acquired without intravenous contrast. Angiographic images of the Circle of Willis were acquired using MRA technique without intravenous contrast. Angiographic images of the neck were acquired using MRA technique without and with intravenous contrast. Carotid stenosis measurements (when applicable) are obtained utilizing NASCET criteria, using the distal internal carotid diameter as the denominator. CONTRAST:  7.665mL GADAVIST GADOBUTROL 1 MMOL/ML IV SOLN COMPARISON:  None FINDINGS: MRI HEAD Brain: There is no acute infarction or intracranial hemorrhage. There is no intracranial mass, mass effect, or edema. There is no hydrocephalus or extra-axial fluid collection. Ventricles and sulci are within normal limits in size and configuration. A few small foci of T2 hyperintensity in the supratentorial white matter are nonspecific but could  reflect minor chronic microvascular ischemic changes. Vascular: Major vessel flow voids at the skull base are preserved. Skull and upper cervical spine: Normal marrow signal is preserved. Sinuses/Orbits: Paranasal sinuses are aerated. Orbits are unremarkable. Other: Sella is unremarkable.  Mastoid air cells are clear. MRA HEAD Intracranial internal carotid arteries are patent. Middle and anterior cerebral arteries are patent. Intracranial vertebral arteries, basilar artery, posterior cerebral arteries are patent. There is no significant stenosis or aneurysm. MRA NECK Great vessel origins are patent. There is no proximal subclavian stenosis. Common, internal, and external carotid arteries are patent. Extracranial vertebral arteries are patent. No hemodynamically significant stenosis or evidence of dissection. IMPRESSION: No acute infarction, hemorrhage, or mass. No large vessel occlusion, hemodynamically significant stenosis, or evidence of dissection. Electronically Signed   By: Guadlupe SpanishPraneil  Patel M.D.   On: 04/27/2021 13:24   CT HEAD CODE STROKE WO CONTRAST  Result Date: 04/27/2021 CLINICAL DATA:  Code stroke. Neuro deficit, acute stroke suspected. Right-sided weakness/numbness. EXAM: CT HEAD WITHOUT CONTRAST TECHNIQUE: Contiguous axial images were obtained from the base of the skull through the vertex without intravenous contrast. COMPARISON:  None. FINDINGS: Brain: No evidence of acute large vascular territory infarction, acute hemorrhage, hydrocephalus, extra-axial collection or mass lesion/mass effect. Punctate hypodensity in the left thalamus. Vascular: No hyperdense vessel identified. Calcific intracranial atherosclerosis. Skull: No acute fracture. Sinuses/Orbits: Mild right maxillary sinus mucosal thickening. No acute orbital findings. Other: No mastoid effusions. ASPECTS [facility]([place] Stroke Program Early CT Score) total score (0-10 with 10 being normal): 10. IMPRESSION: 1. No evidence of acute large vascular  territory infarct or acute hemorrhage. ASPECTS is 10. 2. Punctate hypodensity in the left thalamus is remote appearing, but strictly age determinate in the absence of priors. An MRI could better evaluate for acute infarct if clinically indicated. Code stroke imaging results were communicated on 04/27/2021 at 10:38 am to provider Dr. Darnelle CatalanMalinda via telephone, who verbally acknowledged these results. Electronically Signed   By: Feliberto HartsFrederick S Jones MD   On: 04/27/2021 10:43    ____________________________________________   PROCEDURES  Procedure(s) performed (including Critical Care):  Procedures   ____________________________________________   INITIAL IMPRESSION / ASSESSMENT AND PLAN / ED COURSE  As part of my medical decision making, I reviewed the following data within the electronic MEDICAL RECORD NUMBERChart review from this hospital done here.  Old records reviewed on Care Everywhere only [facility].  Patient recently [place] to be with his daughter.  I discussed his case with his daughter and with the patient and also with the neurologist. Patient's symptoms are resolved almost completely resolved.  He could have maybe have just had a TIA since the MRI is negative.  We will admit him and complete his stroke work-up.  Neurology felt he should  remain on Eliquis.             ____________________________________________   FINAL CLINICAL IMPRESSION(S) / ED DIAGNOSES  Final diagnoses:  Cerebrovascular accident (CVA) due to thrombosis of precerebral artery [facility])     ED Discharge Orders     None        Note:  This document was prepared using Dragon voice recognition software and may include unintentional dictation errors.    Arnaldo Natal, MD 04/27/21 1349    Arnaldo Natal, MD 04/27/21 [phone]

## 2021-04-27 NOTE — Progress Notes (Signed)
*  PRELIMINARY RESULTS* Echocardiogram 2D Echocardiogram has been performed.  Douglas Smith 04/27/2021, 2:30 PM

## 2021-04-27 NOTE — ED Triage Notes (Signed)
Pt comes into the ED via POV c/o numbness to the right arm and leg.  Pt was diagnosed with left DVT and PE 2 weeks ago.  PT states the numbness started at 10:00, LKW was at 09:30. Pt also states he has some increased SHOB.

## 2021-04-27 NOTE — ED Notes (Signed)
CBG 163 

## 2021-04-27 NOTE — H&P (Signed)
History and Physical    Douglas Smith ZJQ:[id number] DOB: [birth year] DOA: 04/27/2021  PCP: [facility]   Patient coming from: Home  I have personally briefly reviewed patient's old medical records in [facility]  Chief Complaint: Right-sided numbness  HPI: Douglas Smith is a 70 y.o. 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  Patient's last known well was about 9:30 AM on the day of admission.  He also complains of tingling sensation in his right hand.  Right-sided numbness was associated with dizziness and nausea but no vomiting He denied having any headache, no loss of consciousness, no slurred speech, no difficulty swallowing, no blurred vision, no chest pain, no shortness of breath, no cough, no fever, no chills, no focal deficits, no diaphoresis or palpitations. Labs show sodium 137, potassium 3.2, chloride 103, bicarb 25, glucose 118, BUN 11, creatinine 0.92, calcium 9.4, alkaline phosphatase 69, albumin 3.7, AST 26, ALT 70, total protein 7.2, white count 7.0, hemoglobin 16.3, hematocrit 49.4, MCV 83.2, RDW 15.9, platelet count 190, PT 13.9, INR 1.1, CT scan of the head done without contrast showed no evidence of acute large vascular territory infarct or acute hemorrhage.  Punctate hypodensity in the left thalamus is remote appearing but strictly age-indeterminate in the absence of prior. MRI of the brain without contrast shows no acute infarction, no hemorrhage or mass. MRA of the head and neck shows no large vessel occlusion, no hemodynamically significant stenosis or evidence of dissection. Twelve-lead EKG shows normal sinus rhythm With nonspecific ST abnormality.  ED Course: Patient is a 70 year old male  who presents to the ER via private vehicle for evaluation of right-sided numbness and tingling sensation which started at about 10 AM.  His last known well was about 9:30 AM on the day of admission. Patient was called in as a code stroke and initial CT scan of the head without contrast was negative for hemorrhage. He was seen in consultation by neurology and was not a candidate for tPA since he is on therapeutic anticoagulation. He will be referred to observation status for further evaluation.    Review of Systems: As per HPI otherwise all other systems reviewed and negative.    Past Medical History:  Diagnosis Date   HTN (hypertension)    Tobacco abuse     Past Surgical History:  Procedure Laterality Date   left shoulder surgery Left      reports that he has been smoking cigarettes. He has never used smokeless tobacco. He reports previous alcohol use. He reports that he does not use drugs.  Not on File  Family History  Problem Relation Age of Onset   Stroke Brother       Prior to Admission medications   Medication Sig Start Date End Date Taking? Authorizing Provider  Apixaban Starter Pack, 10mg  and 5mg , (ELIQUIS DVT/PE STARTER PACK) Take as directed on package: start with two-5mg  tablets twice daily for 7 days. On day 8, switch to one-5mg  tablet twice daily. Patient taking differently: Take 5 mg by mouth 2 (two) times daily. Take as directed on package: start with two-5mg  tablets twice daily for 7 days. On day 8, switch to one-5mg  tablet twice daily. 04/14/21  Yes , MD  aspirin 81 MG EC tablet Take 81 mg by  mouth daily. 09/08/10  Yes [provider]  hydrochlorothiazide (HYDRODIURIL) 25 MG tablet Take 25 mg by mouth daily. 06/15/20  Yes [provider]  nicotine (NICODERM CQ - DOSED IN MG/24 HOURS) 21 mg/24hr patch Place 1 patch (21 mg total) onto the skin daily. 04/15/21  Yes Gillis Santa, MD    Physical Exam: Vitals:   04/27/21 1023 04/27/21  1045 04/27/21 1200  BP: (!) 127/95 (!) 145/96 (!) 130/91  Pulse: 95 81 78  Resp: 18 20 (!) 21  Temp: 98 F (36.7 C)    TempSrc: Oral    SpO2: 98% 100% 99%  Weight:  75.8 kg   Height:   (1.803 m)      Vitals:   04/27/21 1023 04/27/21 1045 04/27/21 1200  BP: (!) 127/95 (!) 145/96 (!) 130/91  Pulse: 95 81 78  Resp: 18 20 (!) 21  Temp: 98 F (36.7 C)    TempSrc: Oral    SpO2: 98% 100% 99%  Weight:  75.8 kg   Height:   (1.803 m)       Constitutional: Alert and oriented x 3 . Not in any apparent distress HEENT:      Head: Normocephalic and atraumatic.         Eyes: PERLA, EOMI, Conjunctivae are normal. Sclera is non-icteric.       Mouth/Throat: Mucous membranes are moist.       Neck: Supple with no signs of meningismus. Cardiovascular: Regular rate and rhythm. No murmurs, gallops, or rubs. 2+ symmetrical distal pulses are present . No JVD. No LE edema Respiratory: Respiratory effort normal .Lungs sounds clear bilaterally. No wheezes, crackles, or rhonchi.  Gastrointestinal: Soft, non tender, and non distended with positive bowel sounds.  Genitourinary: No CVA tenderness. Musculoskeletal: Nontender with normal range of motion in all extremities. No cyanosis, or erythema of extremities. Neurologic:  Face is symmetric. Moving all extremities. No gross focal neurologic deficits . Skin: Skin is warm, dry.  No rash or ulcers Psychiatric: Mood and affect are normal    Labs on Admission: I have personally reviewed following labs and imaging studies  CBC: Recent Labs  Lab 04/27/21 1048  WBC 7.0  NEUTROABS 4.5  HGB 16.3  HCT 49.4  MCV 83.2  PLT 190   Basic Metabolic Panel: Recent Labs  Lab 04/27/21 1305  NA 137  K 3.2*  CL 103  CO2 25  GLUCOSE 118*  BUN 11  CREATININE 0.92  CALCIUM 9.4   GFR: Estimated Creatinine Clearance: 79.6 mL/min (by C-G formula based on SCr of 0.92 mg/dL). Liver Function Tests: Recent Labs  Lab 04/27/21 1305  AST 26  ALT  70*  ALKPHOS 69  BILITOT 0.5  PROT 7.2  ALBUMIN 3.7   No results for input(s): LIPASE, AMYLASE in the last 168 hours. No results for input(s): AMMONIA in the last 168 hours. Coagulation Profile: Recent Labs  Lab 04/27/21 1048  INR 1.1   Cardiac Enzymes: No results for input(s): CKTOTAL, CKMB, CKMBINDEX, TROPONINI in the last 168 hours. BNP (last 3 results) No results for input(s): PROBNP in the last 8760 hours. HbA1C: No results for input(s): HGBA1C in the last 72 hours. CBG: Recent Labs  Lab 04/27/21 1025  GLUCAP 163*   Lipid Profile: No results for input(s): CHOL, HDL, LDLCALC, TRIG, CHOLHDL, LDLDIRECT in the last 72 hours. Thyroid Function Tests: No results for input(s): TSH, T4TOTAL, FREET4, T3FREE, THYROIDAB in the last 72 hours. Anemia Panel: No results for input(s): VITAMINB12,  FOLATE, FERRITIN, TIBC, IRON, RETICCTPCT in the last 72 hours. Urine analysis: No results found for: COLORURINE, APPEARANCEUR, LABSPEC, PHURINE, GLUCOSEU, HGBUR, BILIRUBINUR, KETONESUR, PROTEINUR, UROBILINOGEN, NITRITE, LEUKOCYTESUR  Radiological Exams on Admission: MR ANGIO HEAD WO CONTRAST  Result Date: 04/27/2021 CLINICAL DATA:  Right-sided numbness EXAM: MRI HEAD WITHOUT CONTRAST MRA HEAD WITHOUT CONTRAST MRA NECK WITHOUT AND WITH CONTRAST TECHNIQUE: Multiplanar, multi-echo pulse sequences of the brain and surrounding structures were acquired without intravenous contrast. Angiographic images of the Circle of Willis were acquired using MRA technique without intravenous contrast. Angiographic images of the neck were acquired using MRA technique without and with intravenous contrast. Carotid stenosis measurements (when applicable) are obtained utilizing NASCET criteria, using the distal internal carotid diameter as the denominator. CONTRAST:  7.104mL GADAVIST GADOBUTROL 1 MMOL/ML IV SOLN COMPARISON:  None FINDINGS: MRI HEAD Brain: There is no acute infarction or intracranial hemorrhage. There is no  intracranial mass, mass effect, or edema. There is no hydrocephalus or extra-axial fluid collection. Ventricles and sulci are within normal limits in size and configuration. A few small foci of T2 hyperintensity in the supratentorial white matter are nonspecific but could reflect minor chronic microvascular ischemic changes. Vascular: Major vessel flow voids at the skull base are preserved. Skull and upper cervical spine: Normal marrow signal is preserved. Sinuses/Orbits: Paranasal sinuses are aerated. Orbits are unremarkable. Other: Sella is unremarkable.  Mastoid air cells are clear. MRA HEAD Intracranial internal carotid arteries are patent. Middle and anterior cerebral arteries are patent. Intracranial vertebral arteries, basilar artery, posterior cerebral arteries are patent. There is no significant stenosis or aneurysm. MRA NECK Great vessel origins are patent. There is no proximal subclavian stenosis. Common, internal, and external carotid arteries are patent. Extracranial vertebral arteries are patent. No hemodynamically significant stenosis or evidence of dissection. IMPRESSION: No acute infarction, hemorrhage, or mass. No large vessel occlusion, hemodynamically significant stenosis, or evidence of dissection. Electronically Signed   By: Guadlupe Spanish M.D.   On: 04/27/2021 13:24   MR ANGIO NECK W WO CONTRAST  Result Date: 04/27/2021 CLINICAL DATA:  Right-sided numbness EXAM: MRI HEAD WITHOUT CONTRAST MRA HEAD WITHOUT CONTRAST MRA NECK WITHOUT AND WITH CONTRAST TECHNIQUE: Multiplanar, multi-echo pulse sequences of the brain and surrounding structures were acquired without intravenous contrast. Angiographic images of the Circle of Willis were acquired using MRA technique without intravenous contrast. Angiographic images of the neck were acquired using MRA technique without and with intravenous contrast. Carotid stenosis measurements (when applicable) are obtained utilizing NASCET criteria, using the  distal internal carotid diameter as the denominator. CONTRAST:  7.64mL GADAVIST GADOBUTROL 1 MMOL/ML IV SOLN COMPARISON:  None FINDINGS: MRI HEAD Brain: There is no acute infarction or intracranial hemorrhage. There is no intracranial mass, mass effect, or edema. There is no hydrocephalus or extra-axial fluid collection. Ventricles and sulci are within normal limits in size and configuration. A few small foci of T2 hyperintensity in the supratentorial white matter are nonspecific but could reflect minor chronic microvascular ischemic changes. Vascular: Major vessel flow voids at the skull base are preserved. Skull and upper cervical spine: Normal marrow signal is preserved. Sinuses/Orbits: Paranasal sinuses are aerated. Orbits are unremarkable. Other: Sella is unremarkable.  Mastoid air cells are clear. MRA HEAD Intracranial internal carotid arteries are patent. Middle and anterior cerebral arteries are patent. Intracranial vertebral arteries, basilar artery, posterior cerebral arteries are patent. There is no significant stenosis or aneurysm. MRA NECK Great vessel origins are patent. There is no proximal subclavian stenosis. Common, internal, and external carotid  arteries are patent. Extracranial vertebral arteries are patent. No hemodynamically significant stenosis or evidence of dissection. IMPRESSION: No acute infarction, hemorrhage, or mass. No large vessel occlusion, hemodynamically significant stenosis, or evidence of dissection. Electronically Signed   By: Guadlupe SpanishPraneil  Patel M.D.   On: 04/27/2021 13:24   MR BRAIN WO CONTRAST  Result Date: 04/27/2021 CLINICAL DATA:  Right-sided numbness EXAM: MRI HEAD WITHOUT CONTRAST MRA HEAD WITHOUT CONTRAST MRA NECK WITHOUT AND WITH CONTRAST TECHNIQUE: Multiplanar, multi-echo pulse sequences of the brain and surrounding structures were acquired without intravenous contrast. Angiographic images of the Circle of Willis were acquired using MRA technique without intravenous  contrast. Angiographic images of the neck were acquired using MRA technique without and with intravenous contrast. Carotid stenosis measurements (when applicable) are obtained utilizing NASCET criteria, using the distal internal carotid diameter as the denominator. CONTRAST:  7.365mL GADAVIST GADOBUTROL 1 MMOL/ML IV SOLN COMPARISON:  None FINDINGS: MRI HEAD Brain: There is no acute infarction or intracranial hemorrhage. There is no intracranial mass, mass effect, or edema. There is no hydrocephalus or extra-axial fluid collection. Ventricles and sulci are within normal limits in size and configuration. A few small foci of T2 hyperintensity in the supratentorial white matter are nonspecific but could reflect minor chronic microvascular ischemic changes. Vascular: Major vessel flow voids at the skull base are preserved. Skull and upper cervical spine: Normal marrow signal is preserved. Sinuses/Orbits: Paranasal sinuses are aerated. Orbits are unremarkable. Other: Sella is unremarkable.  Mastoid air cells are clear. MRA HEAD Intracranial internal carotid arteries are patent. Middle and anterior cerebral arteries are patent. Intracranial vertebral arteries, basilar artery, posterior cerebral arteries are patent. There is no significant stenosis or aneurysm. MRA NECK Great vessel origins are patent. There is no proximal subclavian stenosis. Common, internal, and external carotid arteries are patent. Extracranial vertebral arteries are patent. No hemodynamically significant stenosis or evidence of dissection. IMPRESSION: No acute infarction, hemorrhage, or mass. No large vessel occlusion, hemodynamically significant stenosis, or evidence of dissection. Electronically Signed   By: Guadlupe SpanishPraneil  Patel M.D.   On: 04/27/2021 13:24   CT HEAD CODE STROKE WO CONTRAST  Result Date: 04/27/2021 CLINICAL DATA:  Code stroke. Neuro deficit, acute stroke suspected. Right-sided weakness/numbness. EXAM: CT HEAD WITHOUT CONTRAST TECHNIQUE:  Contiguous axial images were obtained from the base of the skull through the vertex without intravenous contrast. COMPARISON:  None. FINDINGS: Brain: No evidence of acute large vascular territory infarction, acute hemorrhage, hydrocephalus, extra-axial collection or mass lesion/mass effect. Punctate hypodensity in the left thalamus. Vascular: No hyperdense vessel identified. Calcific intracranial atherosclerosis. Skull: No acute fracture. Sinuses/Orbits: Mild right maxillary sinus mucosal thickening. No acute orbital findings. Other: No mastoid effusions. ASPECTS [facility]([place] Stroke Program Early CT Score) total score (0-10 with 10 being normal): 10. IMPRESSION: 1. No evidence of acute large vascular territory infarct or acute hemorrhage. ASPECTS is 10. 2. Punctate hypodensity in the left thalamus is remote appearing, but strictly age determinate in the absence of priors. An MRI could better evaluate for acute infarct if clinically indicated. Code stroke imaging results were communicated on 04/27/2021 at 10:38 am to provider Dr. Darnelle CatalanMalinda via telephone, who verbally acknowledged these results. Electronically Signed   By: Feliberto HartsFrederick S Jones MD   On: 04/27/2021 10:43     Assessment/Plan Principal Problem:   TIA (transient ischemic attack) Active Problems:   DVT (deep venous thrombosis) (HCC)   Acute pulmonary embolism (HCC)   Hypokalemia      TIA Patient presents to the ER for evaluation of  right-sided numbness and tingling sensation which has resolved. He was called in as a code stroke and CT scan of the head without contrast was negative for acute hemorrhage He had an MRI which was negative for an acute infarct and MRA which was negative for LVO Continue Eliquis Will place patient on high intensity statin Consult neurology Obtain 2D echocardiogram to assess LVEF and rule out intracardiac thrombus Consult PT/OT/ST    Hypokalemia Secondary to diuretic use Supplement  potassium   Hypertension Continue HCTZ   History of venous thromboembolic disease Continue Eliquis     DVT prophylaxis: Eliquis Code Status: full code  Family Communication: Greater than 50% of time was spent discussing plan of care with patient and his daughter at the bedside.  All questions and concerns have been addressed.  They verbalized understanding and agree with the plan. Disposition Plan: Back to previous home environment Consults called: Neurology Status: Observation    Daryn Pisani MD [facility]     04/27/2021, 2:50 PM

## 2021-04-27 NOTE — ED Notes (Signed)
Pt transported to CT ?

## 2021-04-27 NOTE — ED Notes (Signed)
Report received from Dee, RN 

## 2021-04-28 ENCOUNTER — Observation Stay: Payer: No Typology Code available for payment source

## 2021-04-28 ENCOUNTER — Encounter: Payer: Self-pay | Admitting: Internal Medicine

## 2021-04-28 DIAGNOSIS — I824Z3 Acute embolism and thrombosis of unspecified deep veins of distal lower extremity, bilateral: Secondary | ICD-10-CM

## 2021-04-28 LAB — BASIC METABOLIC PANEL
Anion gap: 6 (ref 5–15)
BUN: 12 mg/dL (ref 8–23)
CO2: 26 mmol/L (ref 22–32)
Calcium: 8.9 mg/dL (ref 8.9–10.3)
Chloride: 106 mmol/L (ref 98–111)
Creatinine, Ser: 0.88 mg/dL (ref 0.61–1.24)
GFR, Estimated: >60 mL/min (ref >60)
Glucose, Bld: 117 mg/dL — ABNORMAL HIGH (ref 70–99)
Potassium: 3.5 mmol/L (ref 3.5–5.1)
Sodium: 138 mmol/L (ref 135–145)

## 2021-04-28 LAB — HEMOGLOBIN A1C
Hgb A1c MFr Bld: 6.4 % — ABNORMAL HIGH (ref 4.8–5.6)
Mean Plasma Glucose: 136.98 mg/dL

## 2021-04-28 LAB — LIPID PANEL
Cholesterol: 174 mg/dL (ref 0–200)
HDL: 39 mg/dL — ABNORMAL LOW (ref >40)
LDL Cholesterol: 114 mg/dL — ABNORMAL HIGH (ref 0–99)
Total CHOL/HDL Ratio: 4.5 RATIO
Triglycerides: 103 mg/dL (ref <150)
VLDL: 21 mg/dL (ref 0–40)

## 2021-04-28 LAB — RESP PANEL BY RT-PCR (FLU A&B, COVID) ARPGX2
Influenza A by PCR: NEGATIVE
Influenza B by PCR: NEGATIVE
SARS Coronavirus 2 by RT PCR: POSITIVE — AB

## 2021-04-28 MED ORDER — POTASSIUM CHLORIDE CRYS ER 20 MEQ PO TBCR
20.0000 meq | EXTENDED_RELEASE_TABLET | Freq: Once | ORAL | Status: AC
  Administered 2021-04-28: 20 meq via ORAL
  Filled 2021-04-28: qty 1

## 2021-04-28 MED ORDER — ATORVASTATIN CALCIUM 40 MG PO TABS: 40.0000 mg | ORAL_TABLET | Freq: Every day | ORAL | 3 refills | Status: DC

## 2021-04-28 MED ORDER — APIXABAN 5 MG PO TABS
5.0000 mg | ORAL_TABLET | Freq: Two times a day (BID) | ORAL | 3 refills | Status: DC
Start: 2021-04-28 — End: 2024-05-04

## 2021-04-28 NOTE — Evaluation (Signed)
Physical Therapy Evaluation Patient Details Name: Douglas Smith MRN: [id number] DOB: 05-05-1951 Today's Date: 04/28/2021   History of Present Illness  presented to ER R-sided numbness, dizziness and nausea; admitted for TIA/CVA work up.  Clinical Impression  Upon evaluation, patient alert and oriented; follows commands and agreeable to participation with treatment session.  Denies pain; endorses presenting symptoms have now fully resolved.  Bilat UE/LE strength and ROM grossly symmetrical and WFL; no focal weakness, sensory or coordination deficits noted.  Able to complete bed mobility with indep; sit/stand, basic transfers and gait (60') without assist device, mod indep.  Demonstrates reciprocal stepping pattern; good step height/length, trunk rotation and arm swing.  Good cadence, gait speed; good ability to negotiate obstacles, room environment without buckling, LOB or safety concern. BERG balance score 50/56; very high-level deficits in periods of SLS (R > L).  Patient with good awareness, good use of compensatory strategies as needed. Appears to be at baseline level of functional ability.  No acute PT needs identified. Will complete order at this time.  Please re-consult should needs change.    Follow Up Recommendations No PT follow up    Equipment Recommendations       Recommendations for Other Services       Precautions / Restrictions Precautions Precautions: Fall Restrictions Weight Bearing Restrictions: No      Mobility  Bed Mobility Overal bed mobility: Independent                  Transfers Overall transfer level: Modified independent Equipment used: None                Ambulation/Gait Ambulation/Gait assistance: Modified independent (Device/Increase time) Gait Distance (Feet): 60 Feet Assistive device: None       General Gait Details: reciprocal stepping pattern; good step height/length, trunk rotation and arm swing.  Good cadence, gait speed;  good ability to negotiate obstacles, room environment without buckling, LOB or safety concern  Stairs            Wheelchair Mobility    Modified Rankin (Stroke Patients Only)       Balance Overall balance assessment: Independent                               Standardized Balance Assessment Standardized Balance Assessment : Berg Balance Test Berg Balance Test Sit to Stand: Able to stand without using hands and stabilize independently Standing Unsupported: Able to stand safely 2 minutes Sitting with Back Unsupported but Feet Supported on Floor or Stool: Able to sit safely and securely 2 minutes Stand to Sit: Sits safely with minimal use of hands Transfers: Able to transfer safely, minor use of hands Standing Unsupported with Eyes Closed: Able to stand 10 seconds safely Standing Ubsupported with Feet Together: Able to place feet together independently and stand 1 minute safely From Standing, Reach Forward with Outstretched Arm: Can reach confidently >25 cm (10") From Standing Position, Pick up Object from Floor: Able to pick up shoe safely and easily From Standing Position, Turn to Look Behind Over each Shoulder: Looks behind from both sides and weight shifts well Turn 360 Degrees: Able to turn 360 degrees safely in 4 seconds or less Standing Unsupported, Alternately Place Feet on Step/Stool: Able to stand independently and complete 8 steps >20 seconds Standing Unsupported, One Foot in Front: Able to take small step independently and hold 30 seconds Standing on One Leg: Tries to lift leg/unable  to hold 3 seconds but remains standing independently Total Score: 50         Pertinent Vitals/Pain Pain Assessment: No/denies pain    Home Living Family/patient expects to be discharged to:: Private residence Living Arrangements: Children Available Help at Discharge: Family;Available PRN/intermittently Type of Home: House Home Access: Level entry     Home Layout:  One level Home Equipment: None      Prior Function Level of Independence: Independent         Comments: Indep with ADLs, household and community mobilization without assist device; no fall history.  Walks to/from store for groceries/needs.     Hand Dominance   Dominant Hand: Right    Extremity/Trunk Assessment   Upper Extremity Assessment Upper Extremity Assessment: Overall WFL for tasks assessed    Lower Extremity Assessment Lower Extremity Assessment: Overall WFL for tasks assessed (grossly 4+ to 5/5 throughout; no focal weakness, sensory or coordination deficit noted)       Communication   Communication: No difficulties  Cognition Arousal/Alertness: Awake/alert Behavior During Therapy: WFL for tasks assessed/performed Overall Cognitive Status: Within Functional Limits for tasks assessed                                        General Comments      Exercises     Assessment/Plan    PT Assessment Patent does not need any further PT services  PT Problem List Decreased activity tolerance;Decreased balance;Decreased mobility       PT Treatment Interventions DME instruction;Gait training;Functional mobility training;Therapeutic activities;Therapeutic exercise;Balance training;Patient/family education    PT Goals (Current goals can be found in the Care Plan section)  Acute Rehab PT Goals Patient Stated Goal: to go home PT Goal Formulation: All assessment and education complete, DC therapy Time For Goal Achievement: 04/28/21 Potential to Achieve Goals: Good    Frequency  (Eval only)   Barriers to discharge        Co-evaluation               AM-PAC PT "6 Clicks" Mobility  Outcome Measure Help needed turning from your back to your side while in a flat bed without using bedrails?: None Help needed moving from lying on your back to sitting on the side of a flat bed without using bedrails?: None Help needed moving to and from a bed to a  chair (including a wheelchair)?: None Help needed standing up from a chair using your arms (e.g., wheelchair or bedside chair)?: None Help needed to walk in hospital room?: None Help needed climbing 3-5 steps with a railing? : None 6 Click Score: 24    End of Session Equipment Utilized During Treatment: Gait belt Activity Tolerance: Patient tolerated treatment well Patient left: in bed;with call bell/phone within reach Nurse Communication: Mobility status PT Visit Diagnosis: Muscle weakness (generalized) (M62.81);Difficulty in walking, not elsewhere classified (R26.2)    Time: 3893-7342 PT Time Calculation (min) (ACUTE ONLY): 17 min   Charges:   PT Evaluation $PT Eval Moderate Complexity: 1 Mod         Oria Klimas H. Manson Passey, PT, DPT, NCS 04/28/21, 1:37 PM [phone]

## 2021-04-28 NOTE — Progress Notes (Addendum)
PROGRESS NOTE    Douglas Smith  ZOX:[id number] DOB: 04/13/1951 DOA: 04/27/2021 PCP: [facility]   Chief Complaint  Patient presents with   Numbness     Brief Narrative: 70 year old male with PMH of Hypertension and DVTs on Eliquis presents to the ED on 7/6 with right sided weakness which as resolved.  MRI of brain and MRA of the head and neck did not show any acute infarction or vessel occlusion.  Radiologist read a decreased T1 marrow signal at C3 and recommended MRI of the cervical spine, which is pending.  Subjective: Patient is doing well. Neurological exam is normal. He states RUE weakness and numbness has resolved.  He has no point tenderness along cervical spine.  Symptoms are not exacerbated by extension or rotation of the neck (spurling sign).  There is mild tenderness along the trapezius muscle.  Shoulder has full range of motion. MRI of cervical spine is pending.   Assessment & Plan: Principal Problem:   TIA (transient ischemic attack) Active Problems:   Acute pulmonary embolism (HCC)   Hypokalemia   DVT (deep venous thrombosis) (HCC)  Acute Right Sided Weakness, resolved: Symptoms developed while on Eliquis.  This is more likely related to cervical radiculopathy isolated to the shoulder girdle resulting in motor dysfunction without significant pain. - Stroke workup was negative - MRI of brain and MRA of head/neck. - MRI of cervical spine is ordered to evaluate for disk herniation, cervical spondylosis or foraminal narrowing.  He may benefit from cervical collar for a short period of immobilization with pain meds PRN.  PT and manipulation may improve neck discomfort if needed in the future.  SSRI/SNRI can be be helpful for radicular pain.  Currently patient is doing ok.  He can follow up with Neurology outpatient to discuss nonoperative management. - Echo showed no RWMA, normal LA size and negative bubble study. - Continue Eliquis and Statin. - Neurology is  consulted, appreciate.  Hypertension: - BP is stable on Hydrochlorothiazide 25 mg daily.  Hypokalemia: - K 3.5 mmol/L, replaced.  RLL Pulmonary Embolism: - Continue Eliquis.  Chronic Emphysema: - Use Inhalers PRN.  Multiple Lung Nodules up to 6 mm: - Follow up with Pulmonary outpatient to monitor 3-6 months.  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  Atherosclerosis: - Eliquis and Statin.  Degenerative Disc Disease: - Pain meds PRN.  Diet Order             Diet Heart Room service appropriate? Yes; Fluid consistency: Thin  Diet effective now                         Patient's Body mass index is 23.29 kg/m.    DVT prophylaxis:  Code Status:   Code Status: Full Code  Family Communication: plan of care discussed with patient at bedside.  Status is: Observation  The patient remains OBS appropriate and will d/c before 2 midnights.  Dispo: The patient is from: Home              Anticipated d/c is to: Home There are no PT needs.              Patient currently is not medically stable to d/c.   Difficult to place patient No    Unresulted Labs (From admission, onward)    None  Medications reviewed:  Scheduled Meds:   stroke: mapping our early stages of recovery book   Does not apply Once   apixaban  5 mg Oral BID   atorvastatin  40 mg Oral Daily   hydrochlorothiazide  25 mg Oral Daily   nicotine  21 mg Transdermal Daily   sodium chloride flush  3 mL Intravenous Once   Continuous Infusions:  Consultants:see note  Procedures:see note  Antimicrobials: Anti-infectives (From admission, onward)    None      Culture/Microbiology Other culture-see note  Objective: Vitals: Today's Vitals   04/28/21 0300 04/28/21 0822 04/28/21 1000 04/28/21 1234  BP: 120/84 110/83  113/80  Pulse: 76 85  78  Resp: 18  20  20   Temp: 97.9 F (36.6 C) 98 F (36.7 C)  98.3 F (36.8 C)  TempSrc: Oral Oral    SpO2:  98%  99%  Weight:      Height:      PainSc:   0-No pain    No intake or output data in the 24 hours ending 04/28/21 1413 Filed Weights   04/27/21 1045  Weight: 75.8 kg   Weight change:   Intake/Output from previous day: No intake/output data recorded. Intake/Output this shift: No intake/output data recorded. Filed Weights   04/27/21 1045  Weight: 75.8 kg    Examination: General exam: AAOx3, older than stated age, weak appearing. HEENT:Oral mucosa moist, Ear/Nose WNL grossly,dentition normal. Respiratory system: bilaterally diminished, no use of accessory muscle, non tender. Cardiovascular system: S1 & S2 +,No JVD. Gastrointestinal system: Abdomen soft, NT,ND, BS+. Nervous System: No focal deficits. Extremities: No edema, distal peripheral pulses palpable.  Skin: No rashes,no icterus. MSK: Normal muscle bulk,tone, power  Data Reviewed: I have personally reviewed following labs and imaging studies CBC: Recent Labs  Lab 04/27/21 1048  WBC 7.0  NEUTROABS 4.5  HGB 16.3  HCT 49.4  MCV 83.2  PLT 190   Basic Metabolic Panel: Recent Labs  Lab 04/27/21 1305 04/28/21 0523  NA 137 138  K 3.2* 3.5  CL 103 106  CO2 25 26  GLUCOSE 118* 117*  BUN 11 12  CREATININE 0.92 0.88  CALCIUM 9.4 8.9   GFR: Estimated Creatinine Clearance: 83.2 mL/min (by C-G formula based on SCr of 0.88 mg/dL). Liver Function Tests: Recent Labs  Lab 04/27/21 1305  AST 26  ALT 70*  ALKPHOS 69  BILITOT 0.5  PROT 7.2  ALBUMIN 3.7   Coagulation Profile: Recent Labs  Lab 04/27/21 1048  INR 1.1    HbA1C: Recent Labs    04/27/21 1048 04/28/21 0523  HGBA1C 6.4* 6.4*   CBG: Recent Labs  Lab 04/27/21 1025  GLUCAP 163*   Lipid Profile: Recent Labs    04/27/21 1305 04/28/21 0523  CHOL  --  174  HDL  --  39*  LDLCALC  --  114*  TRIG  --  103  CHOLHDL  --  4.5  LDLDIRECT  114.0*  --      Recent Results (from the past 240 hour(s))  Resp Panel by RT-PCR (Flu A&B, Covid) Nasopharyngeal Swab     Status: Abnormal   Collection Time: 04/28/21  1:37 AM   Specimen: Nasopharyngeal Swab; Nasopharyngeal(NP) swabs in vial transport medium  Result Value Ref Range Status   SARS Coronavirus 2 by RT PCR POSITIVE (A) NEGATIVE Final    Comment: RESULT CALLED TO, READ BACK BY AND VERIFIED WITH: TAMIKIA HAIRSTON@[id number]  04/28/21 RH (NOTE) SARS-CoV-2 target nucleic acids are DETECTED.  The SARS-CoV-2 RNA is generally detectable in upper respiratory specimens during the acute phase of infection. Positive results are indicative of the presence of the identified virus, but do not rule out bacterial infection or co-infection with other pathogens not detected by the test. Clinical correlation with patient history and other diagnostic information is necessary to determine patient infection status. The expected result is Negative.  Fact Sheet for Patients: BloggerCourse.com  Fact Sheet for Healthcare Providers: SeriousBroker.it  This test is not yet approved or cleared by the [place] FDA and  has been authorized for detection and/or diagnosis of SARS-CoV-2 by FDA under an Emergency Use Authorization (EUA).  This EUA will remain in effect (meaning this test can be u sed) for the duration of  the COVID-19 declaration under Section 564(b)(1) of the Act, 21 U.S.C. section 360bbb-3(b)(1), unless the authorization is terminated or revoked sooner.     Influenza A by PCR NEGATIVE NEGATIVE Final   Influenza B by PCR NEGATIVE NEGATIVE Final    Comment: (NOTE) The Xpert Xpress SARS-CoV-2/FLU/RSV plus assay is intended as an aid in the diagnosis of influenza from Nasopharyngeal swab specimens and should not be used as a sole basis for treatment. Nasal washings and aspirates are unacceptable for Xpert Xpress  SARS-CoV-2/FLU/RSV testing.  Fact Sheet for Patients: BloggerCourse.com  Fact Sheet for Healthcare Providers: SeriousBroker.it  This test is not yet approved or cleared by the [place] FDA and has been authorized for detection and/or diagnosis of SARS-CoV-2 by FDA under an Emergency Use Authorization (EUA). This EUA will remain in effect (meaning this test can be used) for the duration of the COVID-19 declaration under Section 564(b)(1) of the Act, 21 U.S.C. section 360bbb-3(b)(1), unless the authorization is terminated or revoked.  Performed at [facility], [address], [place], [place] [address]      Radiology Studies: MR ANGIO HEAD WO CONTRAST  Addendum Date: 04/27/2021   ADDENDUM REPORT: 04/27/2021 14:53 ADDENDUM: There is indeterminate partially imaged decreased T1 marrow signal at C3. Consider dedicated imaging of the cervical spine. Electronically Signed   By: Guadlupe Spanish M.D.   On: 04/27/2021 14:53   Result Date: 04/27/2021 CLINICAL DATA:  Right-sided numbness EXAM: MRI HEAD WITHOUT CONTRAST MRA HEAD WITHOUT CONTRAST MRA NECK WITHOUT AND WITH CONTRAST TECHNIQUE: Multiplanar, multi-echo pulse sequences of the brain and surrounding structures were acquired without intravenous contrast. Angiographic images of the Circle of Willis were acquired using MRA technique without intravenous contrast. Angiographic images of the neck were acquired using MRA technique without and with intravenous contrast. Carotid stenosis measurements (when applicable) are obtained utilizing NASCET criteria, using the distal internal carotid diameter as the denominator. CONTRAST:  7.60mL GADAVIST GADOBUTROL 1 MMOL/ML IV SOLN COMPARISON:  None FINDINGS: MRI HEAD Brain: There is no acute infarction or intracranial hemorrhage. There is no intracranial mass, mass effect, or edema. There is no hydrocephalus or extra-axial fluid collection.  Ventricles and sulci are within normal limits in size and configuration. A few small foci of T2 hyperintensity in the supratentorial white matter are nonspecific but could reflect minor chronic microvascular ischemic changes. Vascular: Major vessel flow voids at the skull base are preserved. Skull and upper cervical spine: Normal marrow signal is preserved. Sinuses/Orbits: Paranasal sinuses are aerated. Orbits are unremarkable. Other: Sella is unremarkable.  Mastoid air cells are clear. MRA HEAD Intracranial internal carotid arteries are patent. Middle and anterior cerebral arteries are patent. Intracranial vertebral arteries, basilar artery, posterior cerebral arteries are patent. There is no  significant stenosis or aneurysm. MRA NECK Great vessel origins are patent. There is no proximal subclavian stenosis. Common, internal, and external carotid arteries are patent. Extracranial vertebral arteries are patent. No hemodynamically significant stenosis or evidence of dissection. IMPRESSION: No acute infarction, hemorrhage, or mass. No large vessel occlusion, hemodynamically significant stenosis, or evidence of dissection. Electronically Signed: By: Guadlupe Spanish M.D. On: 04/27/2021 13:24   MR ANGIO NECK W WO CONTRAST  Addendum Date: 04/27/2021   ADDENDUM REPORT: 04/27/2021 14:53 ADDENDUM: There is indeterminate partially imaged decreased T1 marrow signal at C3. Consider dedicated imaging of the cervical spine. Electronically Signed   By: Guadlupe Spanish M.D.   On: 04/27/2021 14:53   Result Date: 04/27/2021 CLINICAL DATA:  Right-sided numbness EXAM: MRI HEAD WITHOUT CONTRAST MRA HEAD WITHOUT CONTRAST MRA NECK WITHOUT AND WITH CONTRAST TECHNIQUE: Multiplanar, multi-echo pulse sequences of the brain and surrounding structures were acquired without intravenous contrast. Angiographic images of the Circle of Willis were acquired using MRA technique without intravenous contrast. Angiographic images of the neck were  acquired using MRA technique without and with intravenous contrast. Carotid stenosis measurements (when applicable) are obtained utilizing NASCET criteria, using the distal internal carotid diameter as the denominator. CONTRAST:  7.70mL GADAVIST GADOBUTROL 1 MMOL/ML IV SOLN COMPARISON:  None FINDINGS: MRI HEAD Brain: There is no acute infarction or intracranial hemorrhage. There is no intracranial mass, mass effect, or edema. There is no hydrocephalus or extra-axial fluid collection. Ventricles and sulci are within normal limits in size and configuration. A few small foci of T2 hyperintensity in the supratentorial white matter are nonspecific but could reflect minor chronic microvascular ischemic changes. Vascular: Major vessel flow voids at the skull base are preserved. Skull and upper cervical spine: Normal marrow signal is preserved. Sinuses/Orbits: Paranasal sinuses are aerated. Orbits are unremarkable. Other: Sella is unremarkable.  Mastoid air cells are clear. MRA HEAD Intracranial internal carotid arteries are patent. Middle and anterior cerebral arteries are patent. Intracranial vertebral arteries, basilar artery, posterior cerebral arteries are patent. There is no significant stenosis or aneurysm. MRA NECK Great vessel origins are patent. There is no proximal subclavian stenosis. Common, internal, and external carotid arteries are patent. Extracranial vertebral arteries are patent. No hemodynamically significant stenosis or evidence of dissection. IMPRESSION: No acute infarction, hemorrhage, or mass. No large vessel occlusion, hemodynamically significant stenosis, or evidence of dissection. Electronically Signed: By: Guadlupe Spanish M.D. On: 04/27/2021 13:24   MR BRAIN WO CONTRAST  Addendum Date: 04/27/2021   ADDENDUM REPORT: 04/27/2021 14:53 ADDENDUM: There is indeterminate partially imaged decreased T1 marrow signal at C3. Consider dedicated imaging of the cervical spine. Electronically Signed   By:  Guadlupe Spanish M.D.   On: 04/27/2021 14:53   Result Date: 04/27/2021 CLINICAL DATA:  Right-sided numbness EXAM: MRI HEAD WITHOUT CONTRAST MRA HEAD WITHOUT CONTRAST MRA NECK WITHOUT AND WITH CONTRAST TECHNIQUE: Multiplanar, multi-echo pulse sequences of the brain and surrounding structures were acquired without intravenous contrast. Angiographic images of the Circle of Willis were acquired using MRA technique without intravenous contrast. Angiographic images of the neck were acquired using MRA technique without and with intravenous contrast. Carotid stenosis measurements (when applicable) are obtained utilizing NASCET criteria, using the distal internal carotid diameter as the denominator. CONTRAST:  7.33mL GADAVIST GADOBUTROL 1 MMOL/ML IV SOLN COMPARISON:  None FINDINGS: MRI HEAD Brain: There is no acute infarction or intracranial hemorrhage. There is no intracranial mass, mass effect, or edema. There is no hydrocephalus or extra-axial fluid collection. Ventricles and sulci are  within normal limits in size and configuration. A few small foci of T2 hyperintensity in the supratentorial white matter are nonspecific but could reflect minor chronic microvascular ischemic changes. Vascular: Major vessel flow voids at the skull base are preserved. Skull and upper cervical spine: Normal marrow signal is preserved. Sinuses/Orbits: Paranasal sinuses are aerated. Orbits are unremarkable. Other: Sella is unremarkable.  Mastoid air cells are clear. MRA HEAD Intracranial internal carotid arteries are patent. Middle and anterior cerebral arteries are patent. Intracranial vertebral arteries, basilar artery, posterior cerebral arteries are patent. There is no significant stenosis or aneurysm. MRA NECK Great vessel origins are patent. There is no proximal subclavian stenosis. Common, internal, and external carotid arteries are patent. Extracranial vertebral arteries are patent. No hemodynamically significant stenosis or evidence of  dissection. IMPRESSION: No acute infarction, hemorrhage, or mass. No large vessel occlusion, hemodynamically significant stenosis, or evidence of dissection. Electronically Signed: By: Guadlupe Spanish M.D. On: 04/27/2021 13:24   US Venous Img Lower Unilateral Right  Result Date: 04/27/2021 CLINICAL DATA:  70 year old male with history of left lower extremity deep vein thrombosis. EXAM: RIGHT LOWER EXTREMITY VENOUS DOPPLER ULTRASOUND TECHNIQUE: Gray-scale sonography with graded compression, as well as color Doppler and duplex ultrasound were performed to evaluate the right lower extremity deep venous systems from the level of the common femoral vein and including the common femoral, femoral, profunda femoral, popliteal and calf veins including the posterior tibial, peroneal and gastrocnemius veins when visible. Spectral Doppler was utilized to evaluate flow at rest and with distal augmentation maneuvers in the common femoral, femoral and popliteal veins. The contralateral common femoral vein was also evaluated for comparison. COMPARISON:  04/12/2021 FINDINGS: RIGHT LOWER EXTREMITY Common Femoral Vein: No evidence of thrombus. Normal compressibility, respiratory phasicity and response to augmentation. Central Greater Saphenous Vein: No evidence of thrombus. Normal compressibility and flow on color Doppler imaging. Central Profunda Femoral Vein: No evidence of thrombus. Normal compressibility and flow on color Doppler imaging. Femoral Vein: No evidence of thrombus. Normal compressibility, respiratory phasicity and response to augmentation. Popliteal Vein: No evidence of thrombus. Normal compressibility, respiratory phasicity and response to augmentation. Calf Veins: No evidence of thrombus. Normal compressibility and flow on color Doppler imaging. Venous Reflux:  None. Other Findings:  None. LEFT LOWER EXTREMITY Common Femoral Vein: No evidence of thrombus. Normal compressibility, respiratory phasicity and response  to augmentation. IMPRESSION: No evidence of right lower extremity deep venous thrombosis. Marliss Coots, MD [facility] Electronically Signed   By: Marliss Coots MD   On: 04/27/2021 15:02   ECHOCARDIOGRAM COMPLETE BUBBLE STUDY  Result Date: 04/27/2021    ECHOCARDIOGRAM REPORT   Patient Name:   RIKKI SMESTAD Date of Exam: 04/27/2021 Medical Rec #:  [id number]         Height:       71.0 in Accession #:    [id number]        Weight:       167.0 lb Date of Birth:  April 14, 1951         BSA:          1.953 m Patient Age:    69 years          BP:           145/96 mmHg Patient Gender: M                 HR:           81 bpm. Exam Location:  [facility] Procedure: 2D Echo, Color Doppler, Cardiac Doppler and Strain Analysis Indications:     Stroke 434.91 / I63.9  History:         Patient has no prior history of Echocardiogram examinations.                  Risk Factors:Hypertension. Tobacco abuse.  Sonographer:     Cristela Blue RDCS (AE) Referring Phys:  [id number] Malachi Carl STACK Diagnosing Phys: Debbe Odea MD  Sonographer Comments: Suboptimal parasternal window. Image acquisition challenging due to patient body habitus. Global longitudinal strain was attempted. IMPRESSIONS  1. Left ventricular ejection fraction, by estimation, is 55 to 60%. The left ventricle has normal function. The left ventricle has no regional wall motion abnormalities. There is mild left ventricular hypertrophy. Left ventricular diastolic parameters are consistent with Grade I diastolic dysfunction (impaired relaxation). The average left ventricular global longitudinal strain is -17.2 %.  2. Right ventricular systolic function is normal. The right ventricular size is normal.  3. The mitral valve is normal in structure. No evidence of mitral valve regurgitation.  4. The aortic valve was not well visualized. Aortic valve regurgitation is not visualized.  5. Agitated saline contrast bubble study was  negative, with no evidence of any interatrial shunt. FINDINGS  Left Ventricle: Left ventricular ejection fraction, by estimation, is 55 to 60%. The left ventricle has normal function. The left ventricle has no regional wall motion abnormalities. The average left ventricular global longitudinal strain is -17.2 %. 3D  left ventricular ejection fraction analysis performed but not reported based on interpreter judgement due to suboptimal quality. The left ventricular internal cavity size was normal in size. There is mild left ventricular hypertrophy. Left ventricular diastolic parameters are consistent with Grade I diastolic dysfunction (impaired relaxation). Right Ventricle: The right ventricular size is normal. No increase in right ventricular wall thickness. Right ventricular systolic function is normal. Left Atrium: Left atrial size was normal in size. Right Atrium: Right atrial size was normal in size. Pericardium: There is no evidence of pericardial effusion. Mitral Valve: The mitral valve is normal in structure. No evidence of mitral valve regurgitation. Tricuspid Valve: The tricuspid valve is normal in structure. Tricuspid valve regurgitation is not demonstrated. Aortic Valve: The aortic valve was not well visualized. Aortic valve regurgitation is not visualized. Aortic valve mean gradient measures 2.0 mmHg. Aortic valve peak gradient measures 4.2 mmHg. Aortic valve area, by VTI measures 2.97 cm. Pulmonic Valve: The pulmonic valve was not well visualized. Pulmonic valve regurgitation is not visualized. Aorta: The aortic root is normal in size and structure. Venous: The inferior vena cava was not well visualized. IAS/Shunts: No atrial level shunt detected by color flow Doppler. Agitated saline contrast was given intravenously to evaluate for intracardiac shunting. Agitated saline contrast bubble study was negative, with no evidence of any interatrial shunt.  LEFT VENTRICLE PLAX 2D LVIDd:         4.28 cm   Diastology LVIDs:         3.02 cm  LV e' medial:    0.04 cm/s LV PW:         0.96 cm  LV E/e' medial:  13.4 LV IVS:        0.88 cm  LV e' lateral:   0.09 cm/s LVOT diam:     2.00 cm  LV E/e' lateral: 6.6 LV SV:         50 LV SV Index:   26  2D Longitudinal Strain LVOT Area:     3.14 cm 2D Strain GLS Avg:     -17.2 %                          3D Volume EF:                         3D EF:        50 %                         LV EDV:       89 ml                         LV ESV:       45 ml                         LV SV:        44 ml RIGHT VENTRICLE RV Basal diam:  2.54 cm RV S prime:     12.60 cm/s TAPSE (M-mode): 3.1 cm LEFT ATRIUM           Index       RIGHT ATRIUM           Index LA diam:      2.80 cm 1.43 cm/m  RA Area:     12.50 cm LA Vol (A2C): 25.4 ml 13.01 ml/m RA Volume:   26.70 ml  13.67 ml/m LA Vol (A4C): 26.7 ml 13.67 ml/m  AORTIC VALVE AV Area (Vmax):    2.25 cm AV Area (Vmean):   2.41 cm AV Area (VTI):     2.97 cm AV Vmax:           102.00 cm/s AV Vmean:          73.200 cm/s AV VTI:            0.168 m AV Peak Grad:      4.2 mmHg AV Mean Grad:      2.0 mmHg LVOT Vmax:         73.10 cm/s LVOT Vmean:        56.200 cm/s LVOT VTI:          0.159 m LVOT/AV VTI ratio: 0.95  AORTA Ao Root diam: 2.90 cm MITRAL VALVE               TRICUSPID VALVE MV Area (PHT): 2.71 cm    TR Peak grad:   16.6 mmHg MV Decel Time: 280 msec    TR Vmax:        204.00 cm/s MV E velocity: 0.57 cm/s MV A velocity: 96.00 cm/s  SHUNTS MV E/A ratio:  0.01        Systemic VTI:  0.16 m                            Systemic Diam: 2.00 cm Debbe Odea MD Electronically signed by Debbe Odea MD Signature Date/Time: 04/27/2021/3:58:28 PM    Final    CT HEAD CODE STROKE WO CONTRAST  Result Date: 04/27/2021 CLINICAL DATA:  Code stroke. Neuro deficit, acute stroke suspected. Right-sided weakness/numbness. EXAM: CT HEAD WITHOUT CONTRAST TECHNIQUE: Contiguous axial images were obtained from the base of the skull through the vertex  without intravenous contrast. COMPARISON:  None.  FINDINGS: Brain: No evidence of acute large vascular territory infarction, acute hemorrhage, hydrocephalus, extra-axial collection or mass lesion/mass effect. Punctate hypodensity in the left thalamus. Vascular: No hyperdense vessel identified. Calcific intracranial atherosclerosis. Skull: No acute fracture. Sinuses/Orbits: Mild right maxillary sinus mucosal thickening. No acute orbital findings. Other: No mastoid effusions. ASPECTS [facility] Stroke Program Early CT Score) total score (0-10 with 10 being normal): 10. IMPRESSION: 1. No evidence of acute large vascular territory infarct or acute hemorrhage. ASPECTS is 10. 2. Punctate hypodensity in the left thalamus is remote appearing, but strictly age determinate in the absence of priors. An MRI could better evaluate for acute infarct if clinically indicated. Code stroke imaging results were communicated on 04/27/2021 at 10:38 am to provider Dr. Darnelle Catalan via telephone, who verbally acknowledged these results. Electronically Signed   By: Feliberto Harts MD   On: 04/27/2021 10:43     LOS: 0 days   Baldwin Jamaica, MD [facility]  04/28/2021, 2:13 PM

## 2021-04-28 NOTE — Discharge Summary (Signed)
Physician Discharge Summary  Douglas Smith XYI:[id number] DOB: 02-Oct-1951 DOA: 04/27/2021  PCP: [facility]  Admit date: 04/27/2021 Discharge date: 04/28/2021  Admitted From: Home Disposition:  Home  Recommendations for Outpatient Follow-up:  Follow up with Neurology in 1 week to discuss MRI of Cervical Spine findings. Follow up with Urology in 1 week for prostate exam. Follow up with PCP in 1 week for regular check up and discuss regular screening tests.  (Including Lung CT and Thyroid Ultrasound as listed below).  Home Health: None Equipment/Devices: None  Discharge Condition: Stable Code Status:   Code Status: Full Code Diet recommendation:  Diet Order             Diet - low sodium heart healthy           Diet - low sodium heart healthy           Diet Heart Room service appropriate? Yes; Fluid consistency: Thin  Diet effective now                    Brief/Interim Summary:  70 year old male with PMH of Hypertension and DVTs on Eliquis presents to the ED on 7/6 with right sided weakness which as resolved.  MRI of brain and MRA of the head and neck did not show any acute infarction or vessel occlusion.  MRI of Cervical Spine showed decreased T2 and T1 marrow signal in the C3 vertebral body - indicating mets vs Padgets disease.  Patient will follow up with Neurology and Urology outpatient.  Acute Right Sided Weakness, resolved: Symptoms developed while on Eliquis so is less likely a TIA.  This is related to cervical radiculopathy isolated to the shoulder girdle resulting in motor dysfunction without significant pain. - Stroke workup was negative - MRI of brain and MRA of head/neck.  Echo showed no RWMA, normal LA size and negative bubble study. - MRI of cervical spine showed multilevel degenerative changes.  He may benefit from cervical collar for a short period of immobilization with pain meds PRN.  PT and manipulation may improve neck discomfort if needed in the  future.  SSRI/SNRI can be be helpful for radicular pain.  Currently patient does not need any intervention.   - He will follow up with Neurology outpatient to discuss nonoperative management. - He will follow up with Urology to evaluate and rule out metastases from prostate cancer. - Continue Eliquis and Statin - Prescriptions were sent to [facility] in [place], [place] per family request.  RLL Pulmonary Embolism / LLE Nonocclusive DVT: - Eliquis 5 mg BID.  RLE Pain - This may be vascular claudication or secondary to a lumbar radiculopathy but I would expect that to be bilateral.  These may be intermittent cramps from low potassium levels while on Hydrochlorothiazide. - Continue Eliquis and Statin. - Manage pain complaints conservatively.  COVID Positive, asymptomatic: Unclear when symptoms started.  Multiple family members at COVID. - Repeat COVID test x 1 week.  Follow up with PCP regarding management of symptoms.  Hypokalemia: - K 3.5 mmol/L, replaced.  Chronic Issues:  Hypertension: - Hydrochlorothiazide 25 mg daily.  Follow up with PCP.   Chronic Emphysema: - Inhalers PRN.   Multiple Lung Nodules up to 6 mm: - Follow up with Pulmonary outpatient to monitor after 6 months.   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   Atherosclerosis: - Eliquis and Statin.   Degenerative Disc Disease: - Pain meds PRN.  Discharge Diagnoses:  Principal Problem:   TIA (transient ischemic attack) Active Problems:   Acute pulmonary embolism (HCC)   Hypokalemia   DVT (deep venous thrombosis) (HCC)  Consults: Neurology  Subjective: Patient is medically stable for discharge. Discharge Exam: Vitals:   04/28/21 0822 04/28/21 1234  BP: 110/83 113/80  Pulse: 85 78  Resp: 20 20  Temp: 98 F (36.7 C) 98.3 F (36.8 C)  SpO2: 98% 99%    General: Pt is alert, awake, not in acute distress Cardiovascular: RRR, S1/S2 +, no rubs, no gallops Respiratory: CTA bilaterally, no wheezing, no rhonchi Abdominal: Soft, NT, ND, bowel sounds + Extremities: no edema, no cyanosis  Discharge Instructions  Discharge Instructions     Ambulatory referral to Neurology   Complete by: As directed    An appointment is requested in approximately: 1 week   Ambulatory referral to Urology   Complete by: As directed    Diet - low sodium heart healthy   Complete by: As directed    Diet - low sodium heart healthy   Complete by: As directed    Discharge instructions   Complete by: As directed    Please take Eliquis 5 mg twice daily and Atorvastatin 40 mg daily.  Follow up with Neurology outpatient regarding Cervical MRI findings.  Follow up with Urology for regular prostate exam.  Follow up with regular Primary Care Physician regarding management of Hypertension and other Screening Tests (Chest CT in 6 months, Thyroid Ultrasound in 1 year).   Increase activity slowly   Complete by: As directed    Increase activity slowly   Complete by: As directed       Allergies as of 04/28/2021   Not on File      Medication List     STOP taking these medications    aspirin 81 MG EC tablet   Eliquis DVT/PE Starter Pack Generic drug: Apixaban Starter Pack (  and ) Replaced by: apixaban 5 MG Tabs tablet       TAKE these medications    apixaban 5 MG Tabs tablet Commonly known as: ELIQUIS Take 1 tablet (5 mg total) by mouth 2 (two) times daily. Replaces: Eliquis DVT/PE Starter Pack   atorvastatin 40 MG tablet Commonly known as: LIPITOR Take 1 tablet (40 mg total) by mouth daily. Start taking on: April 29, 2021   hydrochlorothiazide 25 MG tablet Commonly known as: HYDRODIURIL Take 25 mg by mouth daily.   nicotine 21 mg/24hr patch Commonly known as: NICODERM CQ - dosed in mg/24 hours Place 1 patch (21 mg total) onto the skin  daily.        Not on File  The results of significant diagnostics from this hospitalization (including imaging, microbiology, ancillary and laboratory) are listed below for reference.    Microbiology: Recent Results (from the past 240 hour(s))  Resp Panel by RT-PCR (Flu A&B, Covid) Nasopharyngeal Swab     Status: Abnormal   Collection Time: 04/28/21  1:37 AM   Specimen: Nasopharyngeal Swab; Nasopharyngeal(NP) swabs in vial transport medium  Result Value Ref Range Status   SARS Coronavirus 2 by RT PCR POSITIVE (A) NEGATIVE Final    Comment: RESULT CALLED TO, READ BACK BY AND VERIFIED WITH: TAMIKIA HAIRSTON@[id number]  04/28/21 RH (NOTE) SARS-CoV-2 target nucleic acids are DETECTED.  The SARS-CoV-2 RNA is generally detectable in upper respiratory specimens during the acute phase  of infection. Positive results are indicative of the presence of the identified virus, but do not rule out bacterial infection or co-infection with other pathogens not detected by the test. Clinical correlation with patient history and other diagnostic information is necessary to determine patient infection status. The expected result is Negative.  Fact Sheet for Patients: BloggerCourse.com  Fact Sheet for Healthcare Providers: SeriousBroker.it  This test is not yet approved or cleared by the [place] FDA and  has been authorized for detection and/or diagnosis of SARS-CoV-2 by FDA under an Emergency Use Authorization (EUA).  This EUA will remain in effect (meaning this test can be u sed) for the duration of  the COVID-19 declaration under Section 564(b)(1) of the Act, 21 U.S.C. section 360bbb-3(b)(1), unless the authorization is terminated or revoked sooner.     Influenza A by PCR NEGATIVE NEGATIVE Final   Influenza B by PCR NEGATIVE NEGATIVE Final    Comment: (NOTE) The Xpert Xpress SARS-CoV-2/FLU/RSV plus assay is intended as an aid in the diagnosis  of influenza from Nasopharyngeal swab specimens and should not be used as a sole basis for treatment. Nasal washings and aspirates are unacceptable for Xpert Xpress SARS-CoV-2/FLU/RSV testing.  Fact Sheet for Patients: BloggerCourse.com  Fact Sheet for Healthcare Providers: SeriousBroker.it  This test is not yet approved or cleared by the [place] FDA and has been authorized for detection and/or diagnosis of SARS-CoV-2 by FDA under an Emergency Use Authorization (EUA). This EUA will remain in effect (meaning this test can be used) for the duration of the COVID-19 declaration under Section 564(b)(1) of the Act, 21 U.S.C. section 360bbb-3(b)(1), unless the authorization is terminated or revoked.  Performed at [facility], [address], [place], [place] [address]     Procedures/Studies: DG Chest 2 View  Result Date: 04/12/2021 CLINICAL DATA:  Shortness of breath. EXAM: CHEST - 2 VIEW COMPARISON:  None. FINDINGS: Normal heart size and cardiomediastinal contours. No pleural effusion or edema. No airspace opacities. Chronic bronchitic changes noted bilaterally. Left lower lung nodule is identified measuring 1.3 cm. Indeterminate. Visualized osseous structures are unremarkable. IMPRESSION: 1. No acute cardiopulmonary abnormalities. 2. Left lower lung nodule. Recommend further evaluation with nonemergent CT of the chest. Electronically Signed   By: Signa Kell M.D.   On: 04/12/2021 01:23   CT Angio Chest PE W/Cm &/Or Wo Cm  Result Date: 04/12/2021 CLINICAL DATA:  Chest pain. Rule out pulmonary embolus. Shortness of breath for 2 days. EXAM: CT ANGIOGRAPHY CHEST WITH CONTRAST TECHNIQUE: Multidetector CT imaging of the chest was performed using the standard protocol during bolus administration of intravenous contrast. Multiplanar CT image reconstructions and MIPs were obtained to evaluate the vascular anatomy. CONTRAST:  75mL  OMNIPAQUE IOHEXOL 350 MG/ML SOLN COMPARISON:  None. FINDINGS: Cardiovascular: Satisfactory opacification of the pulmonary arteries to the segmental level. There is a filling defect extending from the left upper lobar pulmonary artery into the segmental branches compatible with acute pulmonary embolus, image 45/7. Filling defects within the segmental pulmonary artery to the lingula is also identified, image 176/5. No signs of right heart strain. Heart size is normal. Aortic atherosclerosis. No pericardial effusion. Mediastinum/Nodes: No enlarged mediastinal, hilar, or axillary lymph nodes. Trachea and esophagus demonstrate no significant findings. 2.3 cm nodule in left lobe of thyroid gland is identified, image 31/9. Recommend thyroid US (ref: J Am Coll Radiol. 2015 Feb;12(2): 143-50). No enlarged axillary, supraclavicular, mediastinal or hilar lymph nodes. Lungs/Pleura: No pleural effusion. Paraseptal and centrilobular emphysema with diffuse bronchial  wall thickening. Scattered lung nodules are noted, including: -Nodule within the paramediastinal left upper lobe measures 6 mm, image 51/6. -Left lower lobe lung nodule measures 5 mm, image 71/6. -Also in the left lower lobe is a 4 mm nodule, image 54/6. -Subpleural nodule within the lingula measures 5 mm, image 44/6. -Within the periphery of the right lower lobe there is a 6 mm lung nodule, image 58/6. -Subpleural nodule in the right middle lobe measures 4 mm, image 58/6. No airspace consolidation, atelectasis, or pneumothorax. Upper Abdomen: No acute abnormality. Several kidney cysts are noted. The largest is in the upper pole of right kidney measuring 3 cm. 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 No acute or suspicious osseous findings identified. Review of the MIP images confirms the above findings. IMPRESSION: 1. Examination is positive for acute pulmonary emboli within the left upper lobar pulmonary artery and segmental  branch to the lingula. 2. Multiple, bilateral lung nodules are identified measuring up to 6 mm. Non-contrast chest CT at 3-6 months is recommended. If the nodules are stable at time of repeat CT, then future CT at 18-24 months (from today's scan) is considered optional for low-risk patients, but is recommended for high-risk patients. This recommendation follows the consensus statement: Guidelines for Management of Incidental Pulmonary Nodules Detected on CT Images: From the Fleischner Society 2017; Radiology 2017; 284:228-243. 3. Diffuse bronchial wall thickening with emphysema, as above; imaging findings suggestive of underlying COPD. 4. 2.3 cm left lobe of thyroid gland nodule. Recommend thyroid US (ref: J Am Coll Radiol. 2015 Feb;12(2): 143-50). Aortic Atherosclerosis (ICD10-I70.0) and Emphysema (ICD10-J43.9). Critical Value/emergent results were called by telephone at the time of interpretation on 04/12/2021 at 3:58 am to provider JADE SUNG , who verbally acknowledged these results. Electronically Signed   By: Signa Kellaylor  Stroud M.D.   On: 04/12/2021 03:58   MR ANGIO HEAD WO CONTRAST  Addendum Date: 04/27/2021   ADDENDUM REPORT: 04/27/2021 14:53 ADDENDUM: There is indeterminate partially imaged decreased T1 marrow signal at C3. Consider dedicated imaging of the cervical spine. Electronically Signed   By: Guadlupe SpanishPraneil  Patel M.D.   On: 04/27/2021 14:53   Result Date: 04/27/2021 CLINICAL DATA:  Right-sided numbness EXAM: MRI HEAD WITHOUT CONTRAST MRA HEAD WITHOUT CONTRAST MRA NECK WITHOUT AND WITH CONTRAST TECHNIQUE: Multiplanar, multi-echo pulse sequences of the brain and surrounding structures were acquired without intravenous contrast. Angiographic images of the Circle of Willis were acquired using MRA technique without intravenous contrast. Angiographic images of the neck were acquired using MRA technique without and with intravenous contrast. Carotid stenosis measurements (when applicable) are obtained utilizing  NASCET criteria, using the distal internal carotid diameter as the denominator. CONTRAST:  7.505mL GADAVIST GADOBUTROL 1 MMOL/ML IV SOLN COMPARISON:  None FINDINGS: MRI HEAD Brain: There is no acute infarction or intracranial hemorrhage. There is no intracranial mass, mass effect, or edema. There is no hydrocephalus or extra-axial fluid collection. Ventricles and sulci are within normal limits in size and configuration. A few small foci of T2 hyperintensity in the supratentorial white matter are nonspecific but could reflect minor chronic microvascular ischemic changes. Vascular: Major vessel flow voids at the skull base are preserved. Skull and upper cervical spine: Normal marrow signal is preserved. Sinuses/Orbits: Paranasal sinuses are aerated. Orbits are unremarkable. Other: Sella is unremarkable.  Mastoid air cells are clear. MRA HEAD Intracranial internal carotid arteries are patent. Middle and anterior cerebral arteries are patent. Intracranial vertebral arteries, basilar artery, posterior cerebral arteries are patent. There is no significant  stenosis or aneurysm. MRA NECK Great vessel origins are patent. There is no proximal subclavian stenosis. Common, internal, and external carotid arteries are patent. Extracranial vertebral arteries are patent. No hemodynamically significant stenosis or evidence of dissection. IMPRESSION: No acute infarction, hemorrhage, or mass. No large vessel occlusion, hemodynamically significant stenosis, or evidence of dissection. Electronically Signed: By: Guadlupe Spanish M.D. On: 04/27/2021 13:24   MR ANGIO NECK W WO CONTRAST  Addendum Date: 04/27/2021   ADDENDUM REPORT: 04/27/2021 14:53 ADDENDUM: There is indeterminate partially imaged decreased T1 marrow signal at C3. Consider dedicated imaging of the cervical spine. Electronically Signed   By: Guadlupe Spanish M.D.   On: 04/27/2021 14:53   Result Date: 04/27/2021 CLINICAL DATA:  Right-sided numbness EXAM: MRI HEAD WITHOUT  CONTRAST MRA HEAD WITHOUT CONTRAST MRA NECK WITHOUT AND WITH CONTRAST TECHNIQUE: Multiplanar, multi-echo pulse sequences of the brain and surrounding structures were acquired without intravenous contrast. Angiographic images of the Circle of Willis were acquired using MRA technique without intravenous contrast. Angiographic images of the neck were acquired using MRA technique without and with intravenous contrast. Carotid stenosis measurements (when applicable) are obtained utilizing NASCET criteria, using the distal internal carotid diameter as the denominator. CONTRAST:  7.10mL GADAVIST GADOBUTROL 1 MMOL/ML IV SOLN COMPARISON:  None FINDINGS: MRI HEAD Brain: There is no acute infarction or intracranial hemorrhage. There is no intracranial mass, mass effect, or edema. There is no hydrocephalus or extra-axial fluid collection. Ventricles and sulci are within normal limits in size and configuration. A few small foci of T2 hyperintensity in the supratentorial white matter are nonspecific but could reflect minor chronic microvascular ischemic changes. Vascular: Major vessel flow voids at the skull base are preserved. Skull and upper cervical spine: Normal marrow signal is preserved. Sinuses/Orbits: Paranasal sinuses are aerated. Orbits are unremarkable. Other: Sella is unremarkable.  Mastoid air cells are clear. MRA HEAD Intracranial internal carotid arteries are patent. Middle and anterior cerebral arteries are patent. Intracranial vertebral arteries, basilar artery, posterior cerebral arteries are patent. There is no significant stenosis or aneurysm. MRA NECK Great vessel origins are patent. There is no proximal subclavian stenosis. Common, internal, and external carotid arteries are patent. Extracranial vertebral arteries are patent. No hemodynamically significant stenosis or evidence of dissection. IMPRESSION: No acute infarction, hemorrhage, or mass. No large vessel occlusion, hemodynamically significant stenosis,  or evidence of dissection. Electronically Signed: By: Guadlupe Spanish M.D. On: 04/27/2021 13:24   MR BRAIN WO CONTRAST  Addendum Date: 04/27/2021   ADDENDUM REPORT: 04/27/2021 14:53 ADDENDUM: There is indeterminate partially imaged decreased T1 marrow signal at C3. Consider dedicated imaging of the cervical spine. Electronically Signed   By: Guadlupe Spanish M.D.   On: 04/27/2021 14:53   Result Date: 04/27/2021 CLINICAL DATA:  Right-sided numbness EXAM: MRI HEAD WITHOUT CONTRAST MRA HEAD WITHOUT CONTRAST MRA NECK WITHOUT AND WITH CONTRAST TECHNIQUE: Multiplanar, multi-echo pulse sequences of the brain and surrounding structures were acquired without intravenous contrast. Angiographic images of the Circle of Willis were acquired using MRA technique without intravenous contrast. Angiographic images of the neck were acquired using MRA technique without and with intravenous contrast. Carotid stenosis measurements (when applicable) are obtained utilizing NASCET criteria, using the distal internal carotid diameter as the denominator. CONTRAST:  7.9mL GADAVIST GADOBUTROL 1 MMOL/ML IV SOLN COMPARISON:  None FINDINGS: MRI HEAD Brain: There is no acute infarction or intracranial hemorrhage. There is no intracranial mass, mass effect, or edema. There is no hydrocephalus or extra-axial fluid collection. Ventricles and sulci are within  normal limits in size and configuration. A few small foci of T2 hyperintensity in the supratentorial white matter are nonspecific but could reflect minor chronic microvascular ischemic changes. Vascular: Major vessel flow voids at the skull base are preserved. Skull and upper cervical spine: Normal marrow signal is preserved. Sinuses/Orbits: Paranasal sinuses are aerated. Orbits are unremarkable. Other: Sella is unremarkable.  Mastoid air cells are clear. MRA HEAD Intracranial internal carotid arteries are patent. Middle and anterior cerebral arteries are patent. Intracranial vertebral arteries,  basilar artery, posterior cerebral arteries are patent. There is no significant stenosis or aneurysm. MRA NECK Great vessel origins are patent. There is no proximal subclavian stenosis. Common, internal, and external carotid arteries are patent. Extracranial vertebral arteries are patent. No hemodynamically significant stenosis or evidence of dissection. IMPRESSION: No acute infarction, hemorrhage, or mass. No large vessel occlusion, hemodynamically significant stenosis, or evidence of dissection. Electronically Signed: By: Guadlupe Spanish M.D. On: 04/27/2021 13:24   MR CERVICAL SPINE W WO CONTRAST  Result Date: 04/28/2021 CLINICAL DATA:  Right arm and leg numbness. EXAM: MRI CERVICAL SPINE WITHOUT AND WITH CONTRAST TECHNIQUE: Multiplanar and multiecho pulse sequences of the cervical spine, to include the craniocervical junction and cervicothoracic junction, were obtained without and with intravenous contrast. CONTRAST:  7.50mL GADAVIST GADOBUTROL 1 MMOL/ML IV SOLN COMPARISON:  None. FINDINGS: Alignment: Physiologic. Vertebrae: Heterogeneous, decreased T2 and T1 marrow signal in the C3 vertebral body and posterior elements without corresponding STIR signal abnormality. There is associated subtle mild enhancement. No fracture or evidence of discitis. Cord: Normal signal and morphology.  No intradural enhancement. Posterior Fossa, vertebral arteries, paraspinal tissues: Negative. Disc levels: C2-C3: Negative disc. Mild bilateral uncovertebral hypertrophy. Mild left neuroforaminal stenosis. No spinal canal or right neuroforaminal stenosis. C3-C4: Small posterior disc osteophyte complex and mild bilateral uncovertebral hypertrophy. Moderate left neuroforaminal stenosis. No spinal canal or right neuroforaminal stenosis. C4-C5: Small posterior disc osteophyte complex and mild bilateral facet uncovertebral hypertrophy. Mild spinal canal stenosis. Moderate left greater than right neuroforaminal stenosis. C5-C6: Small  posterior disc osteophyte complex and mild bilateral facet uncovertebral hypertrophy. Mild spinal canal stenosis. Moderate bilateral neuroforaminal stenosis. C6-C7: No significant disc bulge or herniation. Small central annular fissure. Mild bilateral uncovertebral hypertrophy. No stenosis. C7-T1:  Negative. IMPRESSION: 1. Heterogeneous, decreased T2 and T1 marrow signal in the C3 vertebral body and posterior elements with faint enhancement but no corresponding STIR signal abnormality. The differential diagnosis includes sclerotic metastasis (most commonly prostate cancer) and Paget disease. Consider further evaluation with noncontrast CT of the cervical spine and bone scan. 2. Multilevel degenerative changes of the cervical spine as above, worst at C4-C5 and C5-C6 where there is mild spinal canal stenosis and moderate bilateral neuroforaminal stenosis. Electronically Signed   By: Obie Dredge M.D.   On: 04/28/2021 14:53   US Venous Img Lower Bilateral (DVT)  Result Date: 04/12/2021 CLINICAL DATA:  70 year old male with PE EXAM: BILATERAL LOWER EXTREMITY VENOUS DOPPLER ULTRASOUND TECHNIQUE: Gray-scale sonography with graded compression, as well as color Doppler and duplex ultrasound were performed to evaluate the lower extremity deep venous systems from the level of the common femoral vein and including the common femoral, femoral, profunda femoral, popliteal and calf veins including the posterior tibial, peroneal and gastrocnemius veins when visible. The superficial great saphenous vein was also interrogated. Spectral Doppler was utilized to evaluate flow at rest and with distal augmentation maneuvers in the common femoral, femoral and popliteal veins. COMPARISON:  None. FINDINGS: RIGHT LOWER EXTREMITY Common Femoral Vein: No evidence  of thrombus. Normal compressibility, respiratory phasicity and response to augmentation. Saphenofemoral Junction: No evidence of thrombus. Normal compressibility and flow on  color Doppler imaging. Profunda Femoral Vein: No evidence of thrombus. Normal compressibility and flow on color Doppler imaging. Femoral Vein: No evidence of thrombus. Normal compressibility, respiratory phasicity and response to augmentation. Popliteal Vein: No evidence of thrombus. Normal compressibility, respiratory phasicity and response to augmentation. Calf Veins: No evidence of thrombus. Normal compressibility and flow on color Doppler imaging. Superficial Great Saphenous Vein: No evidence of thrombus. Normal compressibility and flow on color Doppler imaging. Other Findings:  None. LEFT LOWER EXTREMITY Common Femoral Vein: No evidence of thrombus. Normal compressibility, respiratory phasicity and response to augmentation. Saphenofemoral Junction: No evidence of thrombus. Normal compressibility and flow on color Doppler imaging. Profunda Femoral Vein: No evidence of thrombus. Normal compressibility and flow on color Doppler imaging. Femoral Vein: Proximal and mid femoral vein compressible with flow maintained. Incompletely compressible distal femoral vein with flow maintained. Incompressible vein extends into the popliteal vein. Popliteal Vein: Incompressible popliteal vein with flow maintained Calf Veins: No evidence of thrombus. Normal compressibility and flow on color Doppler imaging. Superficial Great Saphenous Vein: No evidence of thrombus. Normal compressibility and flow on color Doppler imaging. Other Findings:  None. IMPRESSION: Sonographic survey of the left lower extremity positive for nonocclusive DVT of the distal femoral vein and popliteal vein. Sonographic survey of the right lower extremity negative for DVT Electronically Signed   By: Gilmer Mor D.O.   On: 04/12/2021 11:46   US Venous Img Lower Unilateral Right  Result Date: 04/27/2021 CLINICAL DATA:  70 year old male with history of left lower extremity deep vein thrombosis. EXAM: RIGHT LOWER EXTREMITY VENOUS DOPPLER ULTRASOUND TECHNIQUE:  Gray-scale sonography with graded compression, as well as color Doppler and duplex ultrasound were performed to evaluate the right lower extremity deep venous systems from the level of the common femoral vein and including the common femoral, femoral, profunda femoral, popliteal and calf veins including the posterior tibial, peroneal and gastrocnemius veins when visible. Spectral Doppler was utilized to evaluate flow at rest and with distal augmentation maneuvers in the common femoral, femoral and popliteal veins. The contralateral common femoral vein was also evaluated for comparison. COMPARISON:  04/12/2021 FINDINGS: RIGHT LOWER EXTREMITY Common Femoral Vein: No evidence of thrombus. Normal compressibility, respiratory phasicity and response to augmentation. Central Greater Saphenous Vein: No evidence of thrombus. Normal compressibility and flow on color Doppler imaging. Central Profunda Femoral Vein: No evidence of thrombus. Normal compressibility and flow on color Doppler imaging. Femoral Vein: No evidence of thrombus. Normal compressibility, respiratory phasicity and response to augmentation. Popliteal Vein: No evidence of thrombus. Normal compressibility, respiratory phasicity and response to augmentation. Calf Veins: No evidence of thrombus. Normal compressibility and flow on color Doppler imaging. Venous Reflux:  None. Other Findings:  None. LEFT LOWER EXTREMITY Common Femoral Vein: No evidence of thrombus. Normal compressibility, respiratory phasicity and response to augmentation. IMPRESSION: No evidence of right lower extremity deep venous thrombosis. Marliss Coots, MD [facility] Radiology Electronically Signed   By: Marliss Coots MD   On: 04/27/2021 15:02   US THYROID  Result Date: 04/14/2021 CLINICAL DATA:  Incidental on CT. EXAM: THYROID ULTRASOUND TECHNIQUE: Ultrasound examination of the thyroid gland and adjacent soft tissues was performed.  COMPARISON:  Chest CT from 04/12/2021 FINDINGS: Parenchymal Echotexture: Mildly heterogenous Isthmus: 0.6 cm Right lobe: 5.2 x 2.0 x 1.9 cm Left lobe: 5.3 x 2.7 x 2.6 cm _________________________________________________________  Estimated total number of nodules >/= 1 cm: 5 Number of spongiform nodules >/=  2 cm not described below (TR1): 0 Number of mixed cystic and solid nodules >/= 1.5 cm not described below (TR2): 0 _________________________________________________________ Nodule # 1: Location: Isthmus; Mid Maximum size: 0.7 cm; Other 2 dimensions: 0.7 x 0.5 cm Composition: solid/almost completely solid (2) Echogenicity: hypoechoic (2) Shape: not taller-than-wide (0) Margins: smooth (0) Echogenic foci: none (0) ACR TI-RADS total points: 4. ACR TI-RADS risk category: TR4 (4-6 points). ACR TI-RADS recommendations: Given size (<0.9 cm) and appearance, this nodule does NOT meet TI-RADS criteria for biopsy or dedicated follow-up. _________________________________________________________ There is an additional adjacent subcentimeter solid cystic, benign-appearing nodule (labeled 2) in the isthmus. Nodule # 3: Location: Right; Mid Maximum size: 1.3 cm; Other 2 dimensions: 1.0 x 0.9 cm Composition: mixed cystic and solid (1) Echogenicity: isoechoic (1) Shape: not taller-than-wide (0) Margins: smooth (0) Echogenic foci: none (0) ACR TI-RADS total points: 2. ACR TI-RADS risk category: TR2 (2 points). ACR TI-RADS recommendations: This nodule does NOT meet TI-RADS criteria for biopsy or dedicated follow-up. _________________________________________________________ There is a subcentimeter benign-appearing spongiform nodule (labeled 4) in the right mid thyroid. Nodule # 5: Location: Left; Superior Maximum size: 1.3 cm; Other 2 dimensions: 1.3 x 0.7 cm Composition: mixed cystic and solid (1) Echogenicity: isoechoic (1) Shape: not taller-than-wide (0) Margins: smooth (0) Echogenic foci: none (0) ACR TI-RADS total points: 2. ACR  TI-RADS risk category: TR2 (2 points). ACR TI-RADS recommendations: This nodule does NOT meet TI-RADS criteria for biopsy or dedicated follow-up. _________________________________________________________ Nodule # 6: Location: Left; Mid Maximum size: 2.4 cm; Other 2 dimensions: 1.5 x 1.9 cm Composition: solid/almost completely solid (2) Echogenicity: isoechoic (1) Shape: not taller-than-wide (0) Margins: smooth (0) Echogenic foci: none (0) ACR TI-RADS total points: 3. ACR TI-RADS risk category: TR3 (3 points). ACR TI-RADS recommendations: *Given size (>/= 1.5 - 2.4 cm) and appearance, a follow-up ultrasound in 1 year should be considered based on TI-RADS criteria. _________________________________________________________ Nodule # 7: Location: Left; Inferior Maximum size: 2.0 cm; Other 2 dimensions: 1.8 x 1.0 cm Composition: solid/almost completely solid (2) Echogenicity: isoechoic (1) Shape: not taller-than-wide (0) Margins: smooth (0) Echogenic foci: none (0) ACR TI-RADS total points: 3. ACR TI-RADS risk category: TR3 (3 points). ACR TI-RADS recommendations: *Given size (>/= 1.5 - 2.4 cm) and appearance, a follow-up ultrasound in 1 year should be considered based on TI-RADS criteria. _________________________________________________________ Nodule # 8: Location: Left; Inferior Maximum size: 1.8 cm; Other 2 dimensions: 1.6 x 1.0 cm Composition: solid/almost completely solid (2) Echogenicity: hypoechoic (2) Shape: not taller-than-wide (0) Margins: smooth (0) Echogenic foci: none (0) ACR TI-RADS total points: 4. ACR TI-RADS risk category: TR4 (4-6 points). ACR TI-RADS recommendations: **Given size (>/= 1.5 cm) and appearance, fine needle aspiration of this moderately suspicious nodule should be considered based on TI-RADS criteria. _________________________________________________________ No cervical lymphadenopathy. IMPRESSION: 1. Multinodular goiter. 2. Solid nodule in the left inferior thyroid (labeled 8, 1.8 cm)  meets criteria (TI-RADS category 4) for tissue sampling. Recommend ultrasound-guided fine-needle aspiration. 3. Solid nodules in the left mid (labeled 6, 2.4 cm) and left inferior (labeled 7, 2.0 cm) meet criteria (TI-RADS category 3) for 1 year ultrasound follow-up. 4. The remaining visualized thyroid nodules appear benign and do not warrant additional ultrasound follow-up or tissue sampling. The above is in keeping with the ACR TI-RADS recommendations - J Am Coll Radiol 2017;14:587-595. Marliss Coots, MD [facility] Radiology Electronically Signed   By: Domingo Dimes  Suttle MD   On: 04/14/2021 09:29   ECHOCARDIOGRAM COMPLETE BUBBLE STUDY  Result Date: 04/27/2021    ECHOCARDIOGRAM REPORT   Patient Name:   MIKAEL SKODA Date of Exam: 04/27/2021 Medical Rec #:  [id number]         Height:       71.0 in Accession #:    [id number]        Weight:       167.0 lb Date of Birth:  29-May-1951         BSA:          1.953 m Patient Age:    69 years          BP:           145/96 mmHg Patient Gender: M                 HR:           81 bpm. Exam Location:  [facility] Procedure: 2D Echo, Color Doppler, Cardiac Doppler and Strain Analysis Indications:     Stroke 434.91 / I63.9  History:         Patient has no prior history of Echocardiogram examinations.                  Risk Factors:Hypertension. Tobacco abuse.  Sonographer:     Cristela Blue RDCS (AE) Referring Phys:  [id number] Malachi Carl STACK Diagnosing Phys: Debbe Odea MD  Sonographer Comments: Suboptimal parasternal window. Image acquisition challenging due to patient body habitus. Global longitudinal strain was attempted. IMPRESSIONS  1. Left ventricular ejection fraction, by estimation, is 55 to 60%. The left ventricle has normal function. The left ventricle has no regional wall motion abnormalities. There is mild left ventricular hypertrophy. Left ventricular diastolic parameters are consistent with Grade I diastolic dysfunction  (impaired relaxation). The average left ventricular global longitudinal strain is -17.2 %.  2. Right ventricular systolic function is normal. The right ventricular size is normal.  3. The mitral valve is normal in structure. No evidence of mitral valve regurgitation.  4. The aortic valve was not well visualized. Aortic valve regurgitation is not visualized.  5. Agitated saline contrast bubble study was negative, with no evidence of any interatrial shunt. FINDINGS  Left Ventricle: Left ventricular ejection fraction, by estimation, is 55 to 60%. The left ventricle has normal function. The left ventricle has no regional wall motion abnormalities. The average left ventricular global longitudinal strain is -17.2 %. 3D  left ventricular ejection fraction analysis performed but not reported based on interpreter judgement due to suboptimal quality. The left ventricular internal cavity size was normal in size. There is mild left ventricular hypertrophy. Left ventricular diastolic parameters are consistent with Grade I diastolic dysfunction (impaired relaxation). Right Ventricle: The right ventricular size is normal. No increase in right ventricular wall thickness. Right ventricular systolic function is normal. Left Atrium: Left atrial size was normal in size. Right Atrium: Right atrial size was normal in size. Pericardium: There is no evidence of pericardial effusion. Mitral Valve: The mitral valve is normal in structure. No evidence of mitral valve regurgitation. Tricuspid Valve: The tricuspid valve is normal in structure. Tricuspid valve regurgitation is not demonstrated. Aortic Valve: The aortic valve was not well visualized. Aortic valve regurgitation is not visualized. Aortic valve mean gradient measures 2.0 mmHg. Aortic valve peak gradient measures 4.2 mmHg. Aortic valve area, by VTI measures 2.97 cm. Pulmonic Valve: The pulmonic valve was not well visualized. Pulmonic valve regurgitation is not  visualized. Aorta: The  aortic root is normal in size and structure. Venous: The inferior vena cava was not well visualized. IAS/Shunts: No atrial level shunt detected by color flow Doppler. Agitated saline contrast was given intravenously to evaluate for intracardiac shunting. Agitated saline contrast bubble study was negative, with no evidence of any interatrial shunt.  LEFT VENTRICLE PLAX 2D LVIDd:         4.28 cm  Diastology LVIDs:         3.02 cm  LV e' medial:    0.04 cm/s LV PW:         0.96 cm  LV E/e' medial:  13.4 LV IVS:        0.88 cm  LV e' lateral:   0.09 cm/s LVOT diam:     2.00 cm  LV E/e' lateral: 6.6 LV SV:         50 LV SV Index:   26       2D Longitudinal Strain LVOT Area:     3.14 cm 2D Strain GLS Avg:     -17.2 %                          3D Volume EF:                         3D EF:        50 %                         LV EDV:       89 ml                         LV ESV:       45 ml                         LV SV:        44 ml RIGHT VENTRICLE RV Basal diam:  2.54 cm RV S prime:     12.60 cm/s TAPSE (M-mode): 3.1 cm LEFT ATRIUM           Index       RIGHT ATRIUM           Index LA diam:      2.80 cm 1.43 cm/m  RA Area:     12.50 cm LA Vol (A2C): 25.4 ml 13.01 ml/m RA Volume:   26.70 ml  13.67 ml/m LA Vol (A4C): 26.7 ml 13.67 ml/m  AORTIC VALVE AV Area (Vmax):    2.25 cm AV Area (Vmean):   2.41 cm AV Area (VTI):     2.97 cm AV Vmax:           102.00 cm/s AV Vmean:          73.200 cm/s AV VTI:            0.168 m AV Peak Grad:      4.2 mmHg AV Mean Grad:      2.0 mmHg LVOT Vmax:         73.10 cm/s LVOT Vmean:        56.200 cm/s LVOT VTI:          0.159 m LVOT/AV VTI ratio: 0.95  AORTA Ao Root diam: 2.90 cm MITRAL VALVE  TRICUSPID VALVE MV Area (PHT): 2.71 cm    TR Peak grad:   16.6 mmHg MV Decel Time: 280 msec    TR Vmax:        204.00 cm/s MV E velocity: 0.57 cm/s MV A velocity: 96.00 cm/s  SHUNTS MV E/A ratio:  0.01        Systemic VTI:  0.16 m                            Systemic Diam: 2.00 cm Debbe Odea MD Electronically signed by Debbe Odea MD Signature Date/Time: 04/27/2021/3:58:28 PM    Final    CT HEAD CODE STROKE WO CONTRAST  Result Date: 04/27/2021 CLINICAL DATA:  Code stroke. Neuro deficit, acute stroke suspected. Right-sided weakness/numbness. EXAM: CT HEAD WITHOUT CONTRAST TECHNIQUE: Contiguous axial images were obtained from the base of the skull through the vertex without intravenous contrast. COMPARISON:  None. FINDINGS: Brain: No evidence of acute large vascular territory infarction, acute hemorrhage, hydrocephalus, extra-axial collection or mass lesion/mass effect. Punctate hypodensity in the left thalamus. Vascular: No hyperdense vessel identified. Calcific intracranial atherosclerosis. Skull: No acute fracture. Sinuses/Orbits: Mild right maxillary sinus mucosal thickening. No acute orbital findings. Other: No mastoid effusions. ASPECTS [facility] Stroke Program Early CT Score) total score (0-10 with 10 being normal): 10. IMPRESSION: 1. No evidence of acute large vascular territory infarct or acute hemorrhage. ASPECTS is 10. 2. Punctate hypodensity in the left thalamus is remote appearing, but strictly age determinate in the absence of priors. An MRI could better evaluate for acute infarct if clinically indicated. Code stroke imaging results were communicated on 04/27/2021 at 10:38 am to provider Dr. Darnelle Catalan via telephone, who verbally acknowledged these results. Electronically Signed   By: Feliberto Harts MD   On: 04/27/2021 10:43    Labs: BNP (last 3 results) No results for input(s): BNP in the last 8760 hours. Basic Metabolic Panel: Recent Labs  Lab 04/27/21 1305 04/28/21 0523  NA 137 138  K 3.2* 3.5  CL 103 106  CO2 25 26  GLUCOSE 118* 117*  BUN 11 12  CREATININE 0.92 0.88  CALCIUM 9.4 8.9   Liver Function Tests: Recent Labs  Lab 04/27/21 1305  AST 26  ALT 70*  ALKPHOS 69  BILITOT 0.5  PROT 7.2  ALBUMIN 3.7    CBC: Recent Labs  Lab 04/27/21 1048   WBC 7.0  NEUTROABS 4.5  HGB 16.3  HCT 49.4  MCV 83.2  PLT 190    CBG: Recent Labs  Lab 04/27/21 1025  GLUCAP 163*    Hgb A1c Recent Labs    04/27/21 1048 04/28/21 0523  HGBA1C 6.4* 6.4*   Lipid Profile Recent Labs    04/27/21 1305 04/28/21 0523  CHOL  --  174  HDL  --  39*  LDLCALC  --  114*  TRIG  --  103  CHOLHDL  --  4.5  LDLDIRECT 114.0*  --     Microbiology Recent Results (from the past 240 hour(s))  Resp Panel by RT-PCR (Flu A&B, Covid) Nasopharyngeal Swab     Status: Abnormal   Collection Time: 04/28/21  1:37 AM   Specimen: Nasopharyngeal Swab; Nasopharyngeal(NP) swabs in vial transport medium  Result Value Ref Range Status   SARS Coronavirus 2 by RT PCR POSITIVE (A) NEGATIVE Final    Comment: RESULT CALLED TO, READ BACK BY AND VERIFIED WITH: TAMIKIA HAIRSTON@[id number]  04/28/21 RH (NOTE) SARS-CoV-2 target nucleic acids are  DETECTED.  The SARS-CoV-2 RNA is generally detectable in upper respiratory specimens during the acute phase of infection. Positive results are indicative of the presence of the identified virus, but do not rule out bacterial infection or co-infection with other pathogens not detected by the test. Clinical correlation with patient history and other diagnostic information is necessary to determine patient infection status. The expected result is Negative.  Fact Sheet for Patients: BloggerCourse.com  Fact Sheet for Healthcare Providers: SeriousBroker.it  This test is not yet approved or cleared by the [place] FDA and  has been authorized for detection and/or diagnosis of SARS-CoV-2 by FDA under an Emergency Use Authorization (EUA).  This EUA will remain in effect (meaning this test can be u sed) for the duration of  the COVID-19 declaration under Section 564(b)(1) of the Act, 21 U.S.C. section 360bbb-3(b)(1), unless the authorization is terminated or revoked sooner.      Influenza A by PCR NEGATIVE NEGATIVE Final   Influenza B by PCR NEGATIVE NEGATIVE Final    Comment: (NOTE) The Xpert Xpress SARS-CoV-2/FLU/RSV plus assay is intended as an aid in the diagnosis of influenza from Nasopharyngeal swab specimens and should not be used as a sole basis for treatment. Nasal washings and aspirates are unacceptable for Xpert Xpress SARS-CoV-2/FLU/RSV testing.  Fact Sheet for Patients: BloggerCourse.com  Fact Sheet for Healthcare Providers: SeriousBroker.it  This test is not yet approved or cleared by the [place] FDA and has been authorized for detection and/or diagnosis of SARS-CoV-2 by FDA under an Emergency Use Authorization (EUA). This EUA will remain in effect (meaning this test can be used) for the duration of the COVID-19 declaration under Section 564(b)(1) of the Act, 21 U.S.C. section 360bbb-3(b)(1), unless the authorization is terminated or revoked.  Performed at [facility], [address] [place], [place] [address]      Time coordinating discharge: > 30 minutes  SIGNED: Baldwin Jamaica, MD  [facility] 04/28/2021, 4:25 PM  If 7PM-7AM, please contact night-coverage [email or web address]

## 2021-04-28 NOTE — Progress Notes (Signed)
OT Screen Note  Patient Details Name: Douglas Smith MRN: [id number] DOB: 1951-05-11   Cancelled Treatment:    Reason Eval/Treat Not Completed: OT screened, no needs identified, will sign off. Consult received chart reviewed. Spoke with SLP and RN. Per RN, pt ambulating independently in the room, completing ADL independently. Deficits resolved. No skilled OT needs identified. Will sign off. Please re-consult if additional needs arise.   [name], MPH, MS, OTR/L ascom [phone] 04/28/21, 11:03 AM

## 2021-04-28 NOTE — Progress Notes (Signed)
SLP Cancellation Note  Patient Details Name: Douglas Smith MRN: [id number] DOB: 12/31/1950   Cancelled treatment:       Reason Eval/Treat Not Completed: SLP screened, no needs identified, will sign off (chart reviewed; consulted NSG then met w/ pt. Spoke w/ Dtr via phone.). Pt denied any difficulty swallowing/masticating po's and is currently on a regular diet; tolerates swallowing pills w/ water per NSG. Pt described foods at his breakfast meal he had consumed; the tray was mostly eaten upon observation. Pt conversed at conversational level w/out deficits noted; NSG, pt and Daughter via phone denied any speech-language deficits. Pt stated his speech was at his Baseline. The tingling in his R facial area was "gone now".   No further skilled ST services indicated as pt appears at his baseline. Pt agreed. NSG to reconsult if any change in status while admitted. Pt asked for his Nurse and his meds at end; [place] notified.      Orinda Kenner, MS, CCC-SLP Speech Language Pathologist Rehab Services [phone] [facility] 04/28/2021, 11:03 AM

## 2021-10-12 ENCOUNTER — Other Ambulatory Visit: Payer: Self-pay

## 2021-10-12 ENCOUNTER — Emergency Department: Payer: No Typology Code available for payment source

## 2021-10-12 ENCOUNTER — Emergency Department
Admission: EM | Admit: 2021-10-12 | Discharge: 2021-10-12 | Disposition: A | Discharge status: Home / Self Care | Payer: No Typology Code available for payment source | Attending: Emergency Medicine | Admitting: Emergency Medicine

## 2021-10-12 ENCOUNTER — Encounter: Payer: Self-pay | Admitting: Emergency Medicine

## 2021-10-12 DIAGNOSIS — Z20822 Contact with and (suspected) exposure to covid-19: Secondary | ICD-10-CM | POA: Diagnosis not present

## 2021-10-12 DIAGNOSIS — Z79899 Other long term (current) drug therapy: Secondary | ICD-10-CM | POA: Diagnosis not present

## 2021-10-12 DIAGNOSIS — J449 Chronic obstructive pulmonary disease, unspecified: Secondary | ICD-10-CM | POA: Diagnosis not present

## 2021-10-12 DIAGNOSIS — E876 Hypokalemia: Secondary | ICD-10-CM

## 2021-10-12 DIAGNOSIS — Z87891 Personal history of nicotine dependence: Secondary | ICD-10-CM | POA: Diagnosis not present

## 2021-10-12 DIAGNOSIS — R0602 Shortness of breath: Secondary | ICD-10-CM | POA: Diagnosis present

## 2021-10-12 DIAGNOSIS — I10 Essential (primary) hypertension: Secondary | ICD-10-CM | POA: Diagnosis not present

## 2021-10-12 DIAGNOSIS — Z7901 Long term (current) use of anticoagulants: Secondary | ICD-10-CM | POA: Diagnosis not present

## 2021-10-12 HISTORY — DX: Acute embolism and thrombosis of unspecified deep veins of unspecified lower extremity: I82.409

## 2021-10-12 HISTORY — DX: Other pulmonary embolism without acute cor pulmonale: I26.99

## 2021-10-12 LAB — COMPREHENSIVE METABOLIC PANEL
ALT: 55 U/L — ABNORMAL HIGH (ref 0–44)
AST: 28 U/L (ref 15–41)
Albumin: 3.7 g/dL (ref 3.5–5.0)
Alkaline Phosphatase: 85 U/L (ref 38–126)
Anion gap: 8 (ref 5–15)
BUN: 15 mg/dL (ref 8–23)
CO2: 26 mmol/L (ref 22–32)
Calcium: 9 mg/dL (ref 8.9–10.3)
Chloride: 107 mmol/L (ref 98–111)
Creatinine, Ser: 0.91 mg/dL (ref 0.61–1.24)
GFR, Estimated: >60 mL/min (ref >60)
Glucose, Bld: 171 mg/dL — ABNORMAL HIGH (ref 70–99)
Potassium: 3.3 mmol/L — ABNORMAL LOW (ref 3.5–5.1)
Sodium: 141 mmol/L (ref 135–145)
Total Bilirubin: 0.5 mg/dL (ref 0.3–1.2)
Total Protein: 7.4 g/dL (ref 6.5–8.1)

## 2021-10-12 LAB — CBC WITH DIFFERENTIAL/PLATELET
Abs Immature Granulocytes: 0.02 10*3/uL (ref 0.00–0.07)
Basophils Absolute: 0.1 10*3/uL (ref 0.0–0.1)
Basophils Relative: 1 %
Eosinophils Absolute: 0.2 10*3/uL (ref 0.0–0.5)
Eosinophils Relative: 3 %
HCT: 47.1 % (ref 39.0–52.0)
Hemoglobin: 15.4 g/dL (ref 13.0–17.0)
Immature Granulocytes: 0 %
Lymphocytes Relative: 27 %
Lymphs Abs: 1.8 10*3/uL (ref 0.7–4.0)
MCH: 27.9 pg (ref 26.0–34.0)
MCHC: 32.7 g/dL (ref 30.0–36.0)
MCV: 85.3 fL (ref 80.0–100.0)
Monocytes Absolute: 0.5 10*3/uL (ref 0.1–1.0)
Monocytes Relative: 8 %
Neutro Abs: 4.2 10*3/uL (ref 1.7–7.7)
Neutrophils Relative %: 61 %
Platelets: 224 10*3/uL (ref 150–400)
RBC: 5.52 MIL/uL (ref 4.22–5.81)
RDW: 14.9 % (ref 11.5–15.5)
WBC: 6.8 10*3/uL (ref 4.0–10.5)
nRBC: 0 % (ref 0.0–0.2)

## 2021-10-12 LAB — RESP PANEL BY RT-PCR (FLU A&B, COVID) ARPGX2
Influenza A by PCR: NEGATIVE
Influenza B by PCR: NEGATIVE
SARS Coronavirus 2 by RT PCR: NEGATIVE

## 2021-10-12 LAB — TROPONIN I (HIGH SENSITIVITY)
Troponin I (High Sensitivity): 21 ng/L — ABNORMAL HIGH (ref <18)
Troponin I (High Sensitivity): 17 ng/L (ref <18)

## 2021-10-12 LAB — BRAIN NATRIURETIC PEPTIDE: B Natriuretic Peptide: 15.3 pg/mL (ref 0.0–100.0)

## 2021-10-12 MED ORDER — IOHEXOL 350 MG/ML SOLN
100.0000 mL | Freq: Once | INTRAVENOUS | Status: AC | PRN
  Administered 2021-10-12: 05:00:00 100 mL via INTRAVENOUS

## 2021-10-12 MED ORDER — COMPRESSOR/NEBULIZER MISC: 1.0000 [IU] | 0 refills | Status: DC | PRN

## 2021-10-12 MED ORDER — ALBUTEROL SULFATE (2.5 MG/3ML) 0.083% IN NEBU: 2.5000 mg | INHALATION_SOLUTION | RESPIRATORY_TRACT | 0 refills | Status: DC | PRN

## 2021-10-12 MED ORDER — POTASSIUM CHLORIDE CRYS ER 20 MEQ PO TBCR
40.0000 meq | EXTENDED_RELEASE_TABLET | Freq: Once | ORAL | Status: AC
  Administered 2021-10-12: 07:00:00 40 meq via ORAL
  Filled 2021-10-12: qty 2

## 2021-10-12 MED ORDER — SODIUM CHLORIDE 0.9 % IV BOLUS
500.0000 mL | Freq: Once | INTRAVENOUS | Status: AC
  Administered 2021-10-12: 06:00:00 500 mL via INTRAVENOUS

## 2021-10-12 MED ORDER — ALBUTEROL SULFATE HFA 108 (90 BASE) MCG/ACT IN AERS: 2.0000 | INHALATION_SPRAY | RESPIRATORY_TRACT | 0 refills | Status: DC | PRN

## 2021-10-12 NOTE — ED Provider Notes (Signed)
8:01 AM Assumed care for off going team.   Blood pressure 133/89, pulse 71, temperature 98.2 F (36.8 C), temperature source Oral, resp. rate 19, height 5\' 8"  (1.727 m), SpO2 98 %.  See their HPI for full report but in brief pending repeat troponin amb sat.  Repeat troponin is downtrending and similar to prior.  O2 sat was 97 to 98% with ambulating.  Per plan from off going team will discharge patient with plan for albuterol at home  8:04 AM reevaluated patient.  Patient resting comfortably in bed with oxygen level 100%.  They were updated on the results and felt comfortable with discharge home  I discussed the provisional nature of ED diagnosis, the treatment so far, the ongoing plan of care, follow up appointments and return precautions with the patient and any family or support people present. They expressed understanding and agreed with the plan, discharged home.           , MD 10/12/21 [phone]

## 2021-10-12 NOTE — ED Provider Notes (Signed)
[facility] Emergency Department Provider Note   ____________________________________________   Event Date/Time   First MD Initiated Contact with Patient 10/12/21 0425     (approximate)  I have reviewed the triage vital signs and the nursing notes.   HISTORY  Chief Complaint Shortness of Breath    HPI Douglas Smith is a 70 y.o. male who presents to the ED from home with a chief complaint of shortness of breath.  Patient awoke at 3 AM with shortness of breath and sharp pain to his left lateral chest, nonradiating and not associated with diaphoresis, palpitations, nausea/vomiting or dizziness.  Patient was diagnosed with DVT and PE in June of this year and started on Eliquis.  Family member endorses dry cough over the past couple of days.  Patient denies fever, abdominal pain, dysuria or diarrhea.     Past Medical History:  Diagnosis Date   DVT (deep venous thrombosis) (HCC)    HTN (hypertension)    PE (pulmonary thromboembolism) (HCC)    Tobacco abuse     Patient Active Problem List   Diagnosis Date Noted   TIA (transient ischemic attack) 04/27/2021   Acute pulmonary embolism (HCC) 04/12/2021   Tobacco abuse 04/12/2021   DVT (deep venous thrombosis) (HCC) 04/12/2021   HTN (hypertension)    Lung nodule    Thyroid nodule    Leukocytosis    Hypokalemia    Elevated troponin     Past Surgical History:  Procedure Laterality Date   left shoulder surgery Left     Prior to Admission medications   Medication Sig Start Date End Date Taking? Authorizing Provider  albuterol (PROVENTIL) (2.5 MG/3ML) 0.083% nebulizer solution Take 3 mLs (2.5 mg total) by nebulization every 4 (four) hours as needed for wheezing or shortness of breath. 10/12/21  Yes Irean Hong, MD  albuterol (VENTOLIN HFA) 108 (90 Base) MCG/ACT inhaler Inhale 2 puffs into the lungs every 4 (four) hours as needed for wheezing or shortness of breath. 10/12/21  Yes Irean Hong, MD   Nebulizers (COMPRESSOR/NEBULIZER) MISC 1 Units by Does not apply route every 4 (four) hours as needed. 10/12/21  Yes Irean Hong, MD  apixaban (ELIQUIS) 5 MG TABS tablet Take 1 tablet (5 mg total) by mouth 2 (two) times daily. 04/28/21 05/28/21  Baldwin Jamaica, MD  atorvastatin (LIPITOR) 40 MG tablet Take 1 tablet (40 mg total) by mouth daily. 04/29/21 05/29/21  Baldwin Jamaica, MD  hydrochlorothiazide (HYDRODIURIL) 25 MG tablet Take 25 mg by mouth daily. 06/15/20   [provider]  nicotine (NICODERM CQ - DOSED IN MG/24 HOURS) 21 mg/24hr patch Place 1 patch (21 mg total) onto the skin daily. 04/15/21   Gillis Santa, MD    Allergies Patient has no known allergies.  Family History  Problem Relation Age of Onset   Stroke Brother     Social History Social History   Tobacco Use   Smoking status: Former    Types: Cigarettes   Smokeless tobacco: Never  Vaping Use   Vaping Use: Never used  Substance Use Topics   Alcohol use: Not Currently   Drug use: Never    Review of Systems  Constitutional: No fever/chills Eyes: No visual changes. ENT: No sore throat. Cardiovascular: Positive for chest pain. Respiratory: Positive for cough and shortness of breath. Gastrointestinal: No abdominal pain.  No nausea, no vomiting.  No diarrhea.  No constipation. Genitourinary: Negative for dysuria. Musculoskeletal: Negative for back pain. Skin: Negative for rash. Neurological:  Negative for headaches, focal weakness or numbness.   ____________________________________________   PHYSICAL EXAM:  VITAL SIGNS: ED Triage Vitals  Enc Vitals Group     BP 10/12/21 0346 123/79     Pulse Rate 10/12/21 0346 89     Resp 10/12/21 0346 18     Temp 10/12/21 0346 98.2 F (36.8 C)     Temp Source 10/12/21 0346 Oral     SpO2 10/12/21 0346 92 %     Weight --      Height 10/12/21 0412 5\' 8"  (1.727 m)     Head Circumference --      Peak Flow --      Pain Score 10/12/21 0411 5     Pain Loc --       Pain Edu? --      Excl. in [place]? --     Constitutional: Alert and oriented.  Elderly appearing and in no acute distress. Eyes: Conjunctivae are normal. PERRL. EOMI. Head: Atraumatic. Nose: No congestion/rhinnorhea. Mouth/Throat: Mucous membranes are moist.   Neck: No stridor.   Cardiovascular: Normal rate, regular rhythm. Grossly normal heart sounds.  Good peripheral circulation. Respiratory: Normal respiratory effort.  No retractions. Lungs mildly diminished bibasilarly, otherwise CTAB. Gastrointestinal: Soft and nontender to light or deep palpation. No distention. No abdominal bruits. No CVA tenderness. Musculoskeletal: No lower extremity tenderness nor edema.  No joint effusions. Neurologic:  Normal speech and language. No gross focal neurologic deficits are appreciated. No gait instability. Skin:  Skin is warm, dry and intact. No rash noted. Psychiatric: Mood and affect are normal. Speech and behavior are normal.  ____________________________________________   LABS (all labs ordered are listed, but only abnormal results are displayed)  Labs Reviewed  COMPREHENSIVE METABOLIC PANEL - Abnormal; Notable for the following components:      Result Value   Potassium 3.3 (*)    Glucose, Bld 171 (*)    ALT 55 (*)    All other components within normal limits  TROPONIN I (HIGH SENSITIVITY) - Abnormal; Notable for the following components:   Troponin I (High Sensitivity) 21 (*)    All other components within normal limits  RESP PANEL BY RT-PCR (FLU A&B, COVID) ARPGX2  CBC WITH DIFFERENTIAL/PLATELET  BRAIN NATRIURETIC PEPTIDE  TROPONIN I (HIGH SENSITIVITY)   ____________________________________________  EKG  ED ECG REPORT I, Xariah Silvernail J, the attending physician, personally viewed and interpreted this ECG.   Date: 10/12/2021  EKG Time: 0351  Rate: 92  Rhythm: normal sinus rhythm  Axis: Normal  Intervals:none  ST&T Change:  Nonspecific  ____________________________________________  RADIOLOGY I, Carine Nordgren J, personally viewed and evaluated these images (plain radiographs) as part of my medical decision making, as well as reviewing the written report by the radiologist.  ED MD interpretation: Chest x-ray unremarkable; CT chest negative for PE, pulmonary nodules, emphysema and chronic lung scar  Official radiology report(s): CT Angio Chest PE W/Cm &/Or Wo Cm  Result Date: 10/12/2021 CLINICAL DATA:  70 year old male with shortness of breath since 0300 hours. Pulmonary embolus in June on Eliquis. Cold symptoms for 2 weeks. EXAM: CT ANGIOGRAPHY CHEST WITH CONTRAST TECHNIQUE: Multidetector CT imaging of the chest was performed using the standard protocol during bolus administration of intravenous contrast. Multiplanar CT image reconstructions and MIPs were obtained to evaluate the vascular anatomy. CONTRAST:  172mL OMNIPAQUE IOHEXOL 350 MG/ML SOLN COMPARISON:  04/12/2021 CTA chest. FINDINGS: Cardiovascular: Excellent contrast bolus timing in the pulmonary arterial tree. Left upper lobe and lingula pulmonary embolus has resolved  since June. No focal filling defect identified in the pulmonary arteries to suggest acute pulmonary embolism. No cardiomegaly or pericardial effusion. Little contrast in the aorta today. No calcified coronary artery atherosclerosis is evident. Mediastinum/Nodes: Negative. No mediastinal mass or lymphadenopathy. Lungs/Pleura: Mildly lower lung volumes. Major airways are patent. Emphysema with mild scarring in the lung apices, and mild to moderate chronic scarring in the right lower lobe, stable. Several small left lung pulmonary nodules are stable since June, measuring up to 5 mm (series 6, image 56). Similar stable less numerous right lung nodules, including a 7 mm lower lobe subpleural nodule on series 6, image 54. No new pulmonary abnormality. No pleural effusion. Upper Abdomen: Geographic hepatic  steatosis suspected in the right lobe. Otherwise negative visible liver, gallbladder, spleen, pancreas, adrenal glands, left kidney and proximal bowel. Diverticulosis of the transverse colon and chronic right renal upper pole cyst again noted. Musculoskeletal: No acute osseous abnormality identified. Flowing endplate osteophytes in the lower thoracic spine. Review of the MIP images confirms the above findings. IMPRESSION: 1. Negative for acute pulmonary embolus. Resolved left upper lobe and lingula pulmonary thrombus seen in June. 2. Emphysema (ICD10-J43.9) and chronic lung scarring. Multiple small bilateral pulmonary nodules are stable x6 months since June, up to 7 mm. A repeat noncontrast CT at 12-18 months (from today's scan) is considered optional for low-risk patients, but is recommended for high-risk patients. This recommendation follows the consensus statement: Guidelines for Management of Incidental Pulmonary Nodules Detected on CT Images: From the Fleischner Society 2017; Radiology 2017; 284:228-243. 3. No new pulmonary abnormality. Electronically Signed   By: Genevie Ann M.D.   On: 10/12/2021 05:51   DG Chest Port 1 View  Result Date: 10/12/2021 CLINICAL DATA:  Shortness of breath. EXAM: PORTABLE CHEST 1 VIEW COMPARISON:  04/12/2021 FINDINGS: 0442 hours. Low volume film. Cardiopericardial silhouette is at upper limits of normal for size. Interstitial markings are diffusely coarsened with chronic features. The lungs are clear without focal pneumonia, edema, pneumothorax or pleural effusion. The visualized bony structures of the thorax show no acute abnormality. Telemetry leads overlie the chest. IMPRESSION: Mild chronic interstitial coarsening. No acute cardiopulmonary findings. Electronically Signed   By: Misty Stanley M.D.   On: 10/12/2021 05:12    ____________________________________________   PROCEDURES  Procedure(s) performed (including Critical Care):  .1-3 Lead EKG Interpretation Performed  by: Paulette Blanch, MD Authorized by: Paulette Blanch, MD     Interpretation: normal     ECG rate:  80   ECG rate assessment: normal     Rhythm: sinus rhythm     Ectopy: none     Conduction: normal   Comments:     Placed on cardiac monitor to evaluate for arrhythmias   ____________________________________________   INITIAL IMPRESSION / ASSESSMENT AND PLAN / ED COURSE  As part of my medical decision making, I reviewed the following data within the electronic MEDICAL RECORD NUMBER History obtained from family, Nursing notes reviewed and incorporated, Labs reviewed, EKG interpreted, Old chart reviewed, Radiograph reviewed, and Notes from prior ED visits     70 year old male presenting with shortness of breath. Differential includes, but is not limited to, viral syndrome, bronchitis including COPD exacerbation, pneumonia, reactive airway disease including asthma, CHF including exacerbation with or without pulmonary/interstitial edema, pneumothorax, ACS, thoracic trauma, and pulmonary embolism.   Obtain cardiac panel, chest x-ray.  Consider CTA chest to evaluate PE clot burden.  Patient currently with room air saturations 98%.  Will reassess.  Clinical  Course as of 10/12/21 0655  Wed Oct 12, 2021  0604 Updated patient and family member on CT chest result.  Will replete potassium, repeat troponin and ambulate patient on room air with pulse oximeter. [JS]  [id number] Care be transferred to the oncoming provider at change of shift pending repeat troponin and ambulation trial.  If unremarkable, anticipate patient may be discharged home with prescriptions for albuterol MDI and nebulizer. [JS]    Clinical Course User Index [JS] Paulette Blanch, MD     ____________________________________________   FINAL CLINICAL IMPRESSION(S) / ED DIAGNOSES  Final diagnoses:  Shortness of breath  Chronic obstructive pulmonary disease, unspecified COPD type ([place])  Hypokalemia     ED Discharge Orders           Ordered    albuterol (VENTOLIN HFA) 108 (90 Base) MCG/ACT inhaler  Every 4 hours PRN        10/12/21 0621    albuterol (PROVENTIL) (2.5 MG/3ML) 0.083% nebulizer solution  Every 4 hours PRN        10/12/21 0621    Nebulizers (COMPRESSOR/NEBULIZER) MISC  Every 4 hours PRN        10/12/21 [id number]             Note:  This document was prepared using Dragon voice recognition software and may include unintentional dictation errors.    Paulette Blanch, MD 10/12/21 [phone]

## 2021-10-12 NOTE — ED Notes (Signed)
Verbalized understanding discharge instructions, prescriptions, and follow-up. In no acute distress.   

## 2021-10-12 NOTE — ED Notes (Signed)
Presents to the ED this morning with c/o SOB. Woke this morning 0300 feeling SOB. Cold like s/s for the past two weeks. C/o chest pain/ discomfort on the L side. Treated for blood clots in June. Has had intermittent L sided chest pain since. Felt like he had chills a couple days ago. Previous smoker, stopped back in June. A&Ox4. Skin p/w/d. RR even and nonlabored.

## 2021-10-12 NOTE — Discharge Instructions (Addendum)
1.  You may use Albuterol inhaler 2 puffs every 4 hours as needed for difficulty breathing.  You may also use albuterol nebulizer every 4 hours as needed for difficulty breathing. 2.  Return to the ER for worsening symptoms, persistent vomiting, difficulty breathing or other concerns.

## 2021-10-12 NOTE — ED Notes (Signed)
Pt ambulated to restroom and back.  O2 sat remained between 97-98% on RA.  NAD noted.

## 2021-10-12 NOTE — ED Triage Notes (Signed)
Pt to triage via w/c with no distress noted; st awoke at 3am with [facility] and pain to left lateral chest, nonradiating; currently taking eliquist for hx DVT and PE; nonprod cough recently;

## 2022-05-08 ENCOUNTER — Encounter (HOSPITAL_COMMUNITY): Payer: Self-pay | Admitting: Radiology

## 2022-06-18 IMAGING — MR MR MRA NECK WO/W CM
3 of 4 series · 33 of 48 positions shown · IV contrast (gadavist)
Comparison: None
COMPARISON: None

Addendum:
CLINICAL DATA: Right-sided numbness

EXAM:
MRI HEAD WITHOUT CONTRAST
MRA HEAD WITHOUT CONTRAST
MRA NECK WITHOUT AND WITH CONTRAST
TECHNIQUE: Multiplanar, multi-echo pulse sequences of the brain and surrounding
structures were acquired without intravenous contrast. Angiographic
images of the Circle of Willis were acquired using MRA technique
without intravenous contrast. Angiographic images of the neck were
acquired using MRA technique without and with intravenous contrast.
Carotid stenosis measurements (when applicable) are obtained
utilizing NASCET criteria, using the distal internal carotid
diameter as the denominator.
CONTRAST:  7.5mL GADAVIST GADOBUTROL 1 MMOL/ML IV SOLN

[Series 11: angio_fl3d_cor_post_ttc=2.0s · coronal · B · 0.9mm · 0.85mm/px · 11 of 91 slices shown]
[im 1/91]
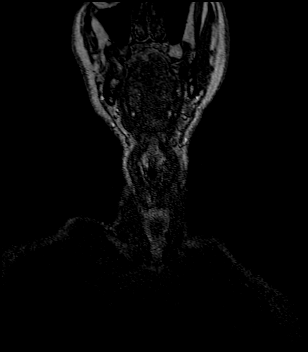
[im 10/91]
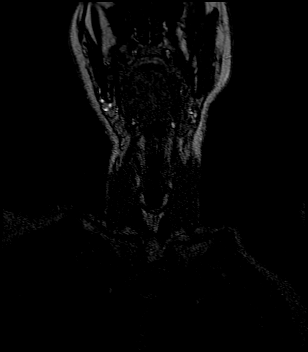
[im 19/91]
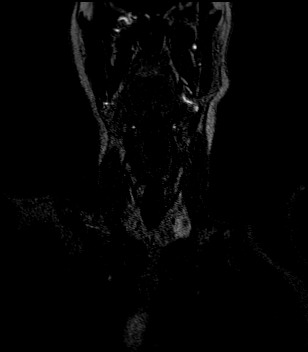
[im 28/91]
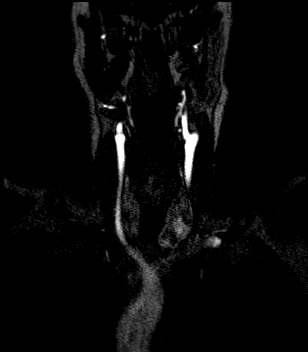
[im 37/91]
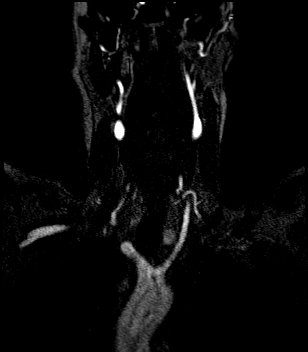
[im 46/91]
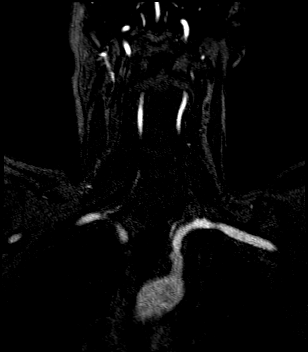
[im 55/91]
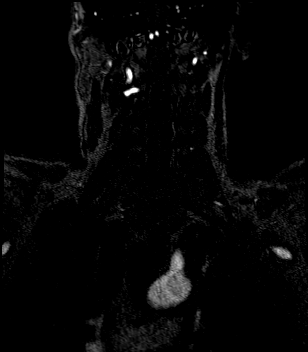
[im 64/91]
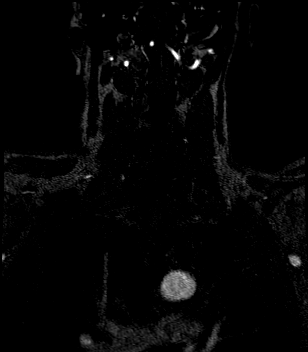
[im 73/91]
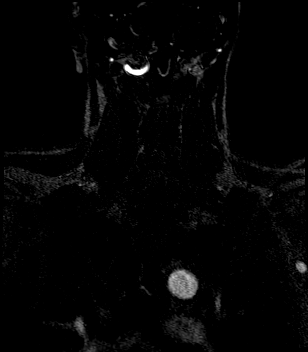
[im 82/91]
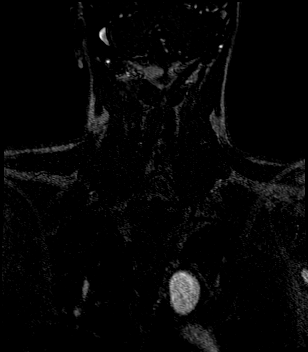
[im 91/91]
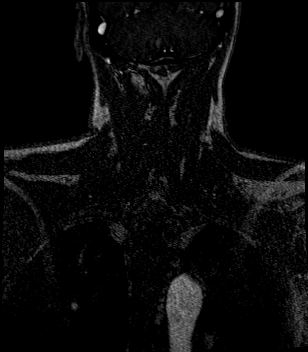

[Series 12: angio_fl3d_cor_post_ttc=2.0s_moco-adv · coronal · B · 0.9mm · 0.85mm/px · 11 of 90 slices shown]
[im 1/90]
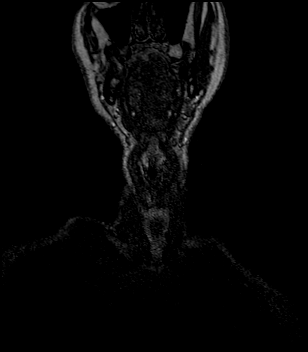
[im 9/90]
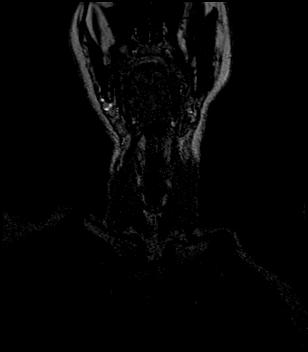
[im 18/90]
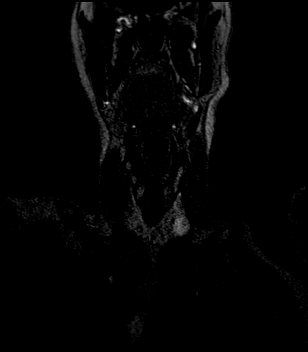
[im 27/90]
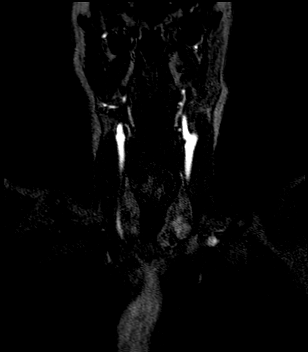
[im 36/90]
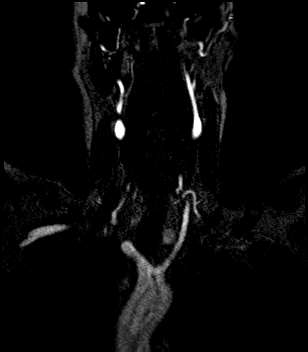
[im 45/90]
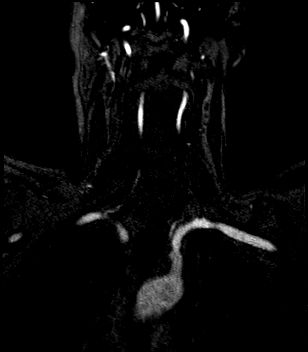
[im 54/90]
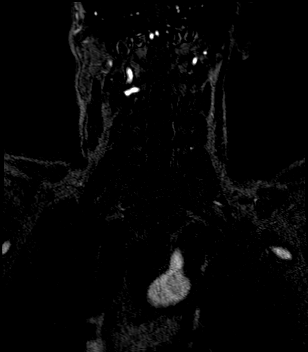
[im 63/90]
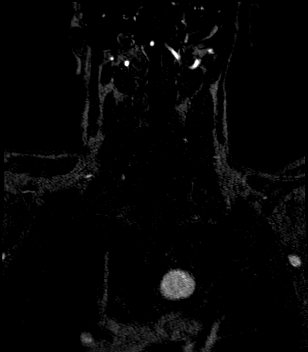
[im 72/90]
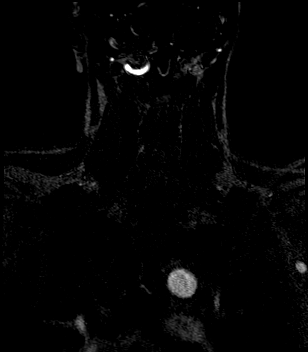
[im 81/90]
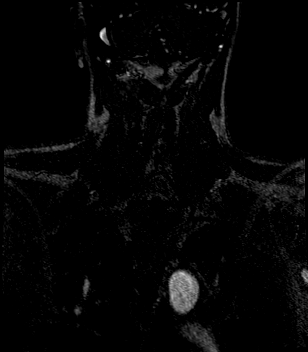
[im 90/90]
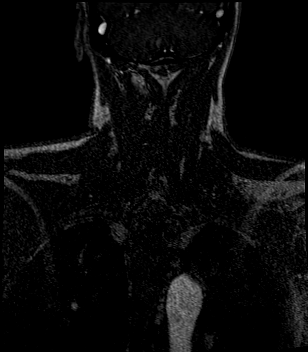

[Series 13: angio_fl3d_cor_post_ttc=2.0s_moco-adv_sub · coronal · B · 0.9mm · 0.85mm/px · 11 of 93 slices shown]
[im 1/93]
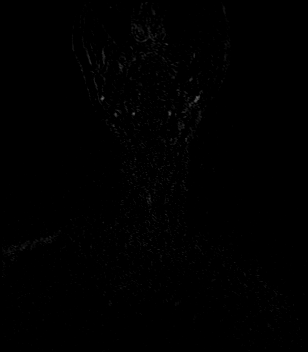
[im 10/93]
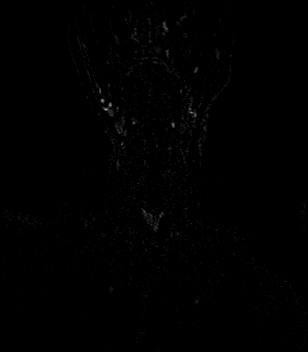
[im 19/93]
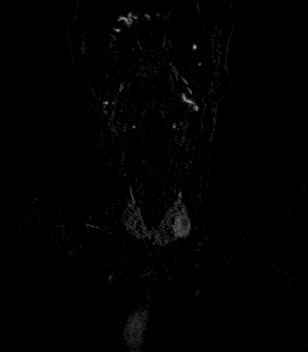
[im 28/93]
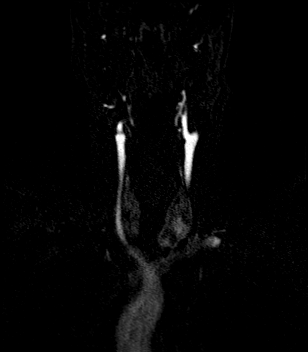
[im 37/93]
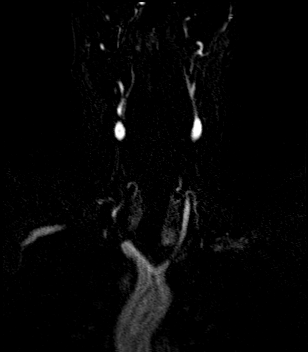
[im 47/93]
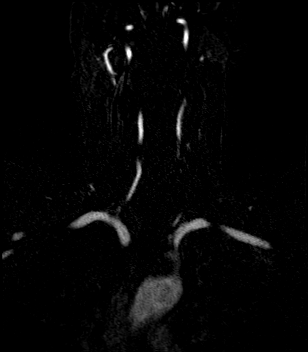
[im 56/93]
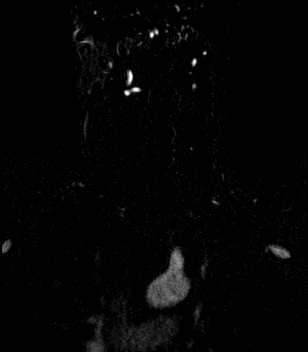
[im 65/93]
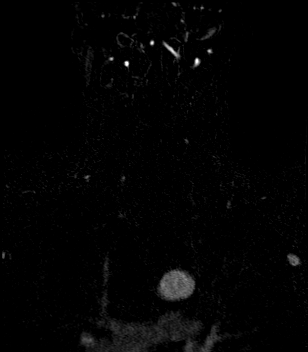
[im 74/93]
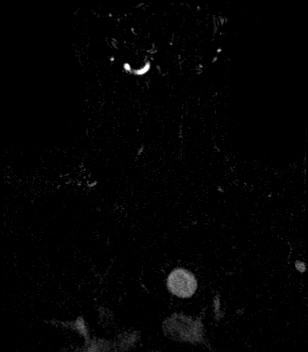
[im 83/93]
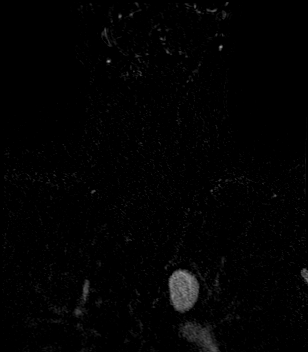
[im 93/93]
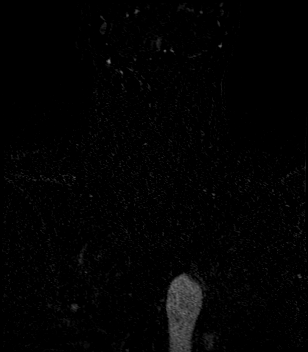

[33 of 48 positions shown; findings below may reference images not displayed]

FINDINGS: MRI HEAD

Brain: There is no acute infarction or intracranial hemorrhage.
There is no intracranial mass, mass effect, or edema. There is no
hydrocephalus or extra-axial fluid collection. Ventricles and sulci
are within normal limits in size and configuration. A few small foci
of T2 hyperintensity in the supratentorial white matter are
nonspecific but could reflect minor chronic microvascular ischemic
changes.

Vascular: Major vessel flow voids at the skull base are preserved.

Skull and upper cervical spine: Normal marrow signal is preserved.

Sinuses/Orbits: Paranasal sinuses are aerated. Orbits are
unremarkable.

Other: Sella is unremarkable.  Mastoid air cells are clear.

MRA HEAD

Intracranial internal carotid arteries are patent. Middle and
anterior cerebral arteries are patent. Intracranial vertebral
arteries, basilar artery, posterior cerebral arteries are patent.
There is no significant stenosis or aneurysm.

MRA NECK

Great vessel origins are patent. There is no proximal subclavian
stenosis. Common, internal, and external carotid arteries are
patent. Extracranial vertebral arteries are patent. No
hemodynamically significant stenosis or evidence of dissection.
IMPRESSION: No acute infarction, hemorrhage, or mass.

No large vessel occlusion, hemodynamically significant stenosis, or
evidence of dissection.

ADDENDUM:
There is indeterminate partially imaged decreased T1 marrow signal
at C3. Consider dedicated imaging of the cervical spine.

*** End of Addendum ***
FINDINGS: MRI HEAD

Brain: There is no acute infarction or intracranial hemorrhage.
There is no intracranial mass, mass effect, or edema. There is no
hydrocephalus or extra-axial fluid collection. Ventricles and sulci
are within normal limits in size and configuration. A few small foci
of T2 hyperintensity in the supratentorial white matter are
nonspecific but could reflect minor chronic microvascular ischemic
changes.

Vascular: Major vessel flow voids at the skull base are preserved.

Skull and upper cervical spine: Normal marrow signal is preserved.

Sinuses/Orbits: Paranasal sinuses are aerated. Orbits are
unremarkable.

Other: Sella is unremarkable.  Mastoid air cells are clear.

MRA HEAD

Intracranial internal carotid arteries are patent. Middle and
anterior cerebral arteries are patent. Intracranial vertebral
arteries, basilar artery, posterior cerebral arteries are patent.
There is no significant stenosis or aneurysm.

MRA NECK

Great vessel origins are patent. There is no proximal subclavian
stenosis. Common, internal, and external carotid arteries are
patent. Extracranial vertebral arteries are patent. No
hemodynamically significant stenosis or evidence of dissection.
IMPRESSION: No acute infarction, hemorrhage, or mass.

No large vessel occlusion, hemodynamically significant stenosis, or
evidence of dissection.

## 2022-10-18 ENCOUNTER — Emergency Department
Admission: EM | Admit: 2022-10-18 | Discharge: 2022-10-18 | Disposition: A | Discharge status: Home / Self Care | Payer: No Typology Code available for payment source | Attending: Emergency Medicine | Admitting: Emergency Medicine

## 2022-10-18 ENCOUNTER — Emergency Department: Payer: No Typology Code available for payment source

## 2022-10-18 ENCOUNTER — Other Ambulatory Visit: Payer: Self-pay

## 2022-10-18 DIAGNOSIS — Z1152 Encounter for screening for COVID-19: Secondary | ICD-10-CM | POA: Insufficient documentation

## 2022-10-18 DIAGNOSIS — J101 Influenza due to other identified influenza virus with other respiratory manifestations: Secondary | ICD-10-CM | POA: Diagnosis not present

## 2022-10-18 DIAGNOSIS — R0602 Shortness of breath: Secondary | ICD-10-CM | POA: Diagnosis present

## 2022-10-18 DIAGNOSIS — Z7901 Long term (current) use of anticoagulants: Secondary | ICD-10-CM | POA: Insufficient documentation

## 2022-10-18 DIAGNOSIS — Z8616 Personal history of COVID-19: Secondary | ICD-10-CM | POA: Diagnosis not present

## 2022-10-18 DIAGNOSIS — Z86711 Personal history of pulmonary embolism: Secondary | ICD-10-CM | POA: Diagnosis not present

## 2022-10-18 LAB — COMPREHENSIVE METABOLIC PANEL
ALT: 65 U/L — ABNORMAL HIGH (ref 0–44)
AST: 34 U/L (ref 15–41)
Albumin: 4.1 g/dL (ref 3.5–5.0)
Alkaline Phosphatase: 93 U/L (ref 38–126)
Anion gap: 7 (ref 5–15)
BUN: 9 mg/dL (ref 8–23)
CO2: 23 mmol/L (ref 22–32)
Calcium: 8.9 mg/dL (ref 8.9–10.3)
Chloride: 108 mmol/L (ref 98–111)
Creatinine, Ser: 0.85 mg/dL (ref 0.61–1.24)
GFR, Estimated: >60 mL/min (ref >60)
Glucose, Bld: 200 mg/dL — ABNORMAL HIGH (ref 70–99)
Potassium: 3.7 mmol/L (ref 3.5–5.1)
Sodium: 138 mmol/L (ref 135–145)
Total Bilirubin: 1 mg/dL (ref 0.3–1.2)
Total Protein: 8.1 g/dL (ref 6.5–8.1)

## 2022-10-18 LAB — CBC WITH DIFFERENTIAL/PLATELET
Abs Immature Granulocytes: 0.04 10*3/uL (ref 0.00–0.07)
Basophils Absolute: 0 10*3/uL (ref 0.0–0.1)
Basophils Relative: 1 %
Eosinophils Absolute: 0.1 10*3/uL (ref 0.0–0.5)
Eosinophils Relative: 1 %
HCT: 47.5 % (ref 39.0–52.0)
Hemoglobin: 15.1 g/dL (ref 13.0–17.0)
Immature Granulocytes: 1 %
Lymphocytes Relative: 7 %
Lymphs Abs: 0.6 10*3/uL — ABNORMAL LOW (ref 0.7–4.0)
MCH: 27.4 pg (ref 26.0–34.0)
MCHC: 31.8 g/dL (ref 30.0–36.0)
MCV: 86.1 fL (ref 80.0–100.0)
Monocytes Absolute: 0.4 10*3/uL (ref 0.1–1.0)
Monocytes Relative: 5 %
Neutro Abs: 7.6 10*3/uL (ref 1.7–7.7)
Neutrophils Relative %: 85 %
Platelets: 196 10*3/uL (ref 150–400)
RBC: 5.52 MIL/uL (ref 4.22–5.81)
RDW: 14.9 % (ref 11.5–15.5)
WBC: 8.7 10*3/uL (ref 4.0–10.5)
nRBC: 0 % (ref 0.0–0.2)

## 2022-10-18 LAB — RESP PANEL BY RT-PCR (RSV, FLU A&B, COVID)  RVPGX2
Influenza A by PCR: POSITIVE — AB
Influenza B by PCR: NEGATIVE
Resp Syncytial Virus by PCR: NEGATIVE
SARS Coronavirus 2 by RT PCR: NEGATIVE

## 2022-10-18 LAB — TROPONIN I (HIGH SENSITIVITY)
Troponin I (High Sensitivity): 19 ng/L — ABNORMAL HIGH (ref <18)
Troponin I (High Sensitivity): 14 ng/L (ref <18)

## 2022-10-18 LAB — BRAIN NATRIURETIC PEPTIDE: B Natriuretic Peptide: 61.5 pg/mL (ref 0.0–100.0)

## 2022-10-18 MED ORDER — ACETAMINOPHEN 325 MG PO TABS
650.0000 mg | ORAL_TABLET | Freq: Once | ORAL | Status: AC
Start: 2022-10-18 — End: 2022-10-18
  Administered 2022-10-18: 650 mg via ORAL
  Filled 2022-10-18: qty 2

## 2022-10-18 NOTE — ED Provider Triage Note (Signed)
Emergency Medicine Provider Triage Evaluation Note  Douglas Smith , a 71 y.o. male  was evaluated in triage.  Pt complains of back pain without injury. Cough, congestion, shortness of breath.  Worried about blood clot with history of same.  States this is not the flu.    Review of Systems  Positive: Cough, back pain.   Negative: Vomiting, diarrhea.    Physical Exam  BP 139/87 (BP Location: Right Arm)   Pulse (!) 101   Temp 98.8 F (37.1 C) (Oral)   Resp 18   Ht 6' (1.829 m)   Wt 90.7 kg   SpO2 95%   BMI 27.12 kg/m  Gen:   Awake, no distress   Resp:  Normal effort  MSK:   Moves extremities without difficulty.  Ambulates without injury.   Other:    Medical Decision Making  Medically screening exam initiated at 8:35 AM.  Appropriate orders placed.  Douglas Smith was informed that the remainder of the evaluation will be completed by another provider, this initial triage assessment does not replace that evaluation, and the importance of remaining in the ED until their evaluation is complete.     Tommi Rumps, PA-C 10/18/22 [phone]

## 2022-10-18 NOTE — ED Triage Notes (Addendum)
Pt brought in by EMS c/o SOB. PT also endorses cough, body aches, and chills that started yesterday. Pt in NAD at this time. Denies CP. Pt takes eliquis

## 2022-10-18 NOTE — ED Provider Triage Note (Signed)
Emergency Medicine Provider Triage Evaluation Note  Douglas Smith , a 71 y.o. male  was evaluated in triage.  Pt complains of cough, congestion, shortness of breath.  Review of Systems  Positive: Cough, shortness of breath Negative: Vomiting  Physical Exam  BP (!) 136/96 (BP Location: Left Arm)   Pulse (!) 101   Temp 99.9 F (37.7 C) (Oral)   Resp 20   Ht 6' (1.829 m)   Wt 90.7 kg   SpO2 96%   BMI 27.12 kg/m  Gen:   Awake, no distress   Resp:  Normal effort  MSK:   Moves extremities without difficulty  Other:  No rash  Medical Decision Making  Medically screening exam initiated at 3:53 AM.  Appropriate orders placed.  Kavan Dreyfuss was informed that the remainder of the evaluation will be completed by another provider, this initial triage assessment does not replace that evaluation, and the importance of remaining in the ED until their evaluation is complete.  71 year old male presenting with flulike symptoms.  Obtain cardiac and respiratory panel, chest x-ray while patient is awaiting treatment room.   Irean Hong, MD 10/18/22 [phone]

## 2022-10-18 NOTE — ED Notes (Signed)
AAOx3.  Skin warm and dry.  No SOB/ DOE.  NAD 

## 2022-10-18 NOTE — ED Triage Notes (Signed)
EMS brings pt in from home for c/o recent cough and chest discomfort

## 2022-10-18 NOTE — ED Provider Notes (Signed)
[facility] Provider Note    Event Date/Time   First MD Initiated Contact with Patient 10/18/22 1141     (approximate)   History   Shortness of Breath   HPI  Douglas Smith is a 71 y.o. male who presents for evaluation of 1 day of shortness of breath, cough, body aches, chills, and subjective fever.  The patient takes Eliquis for history of PE and has been compliant with his medication.  He said that his chest is aching particularly when he coughs but he is able to ambulate without any difficulty.  No one around him has been sick of which he is aware but he has spent some time with one of his grandchildren recently.  No nausea, vomiting, nor abdominal pain.  No dysuria.  No neck pain or stiffness.     Physical Exam   Triage Vital Signs: ED Triage Vitals  Enc Vitals Group     BP 10/18/22 0351 (!) 136/96     Pulse Rate 10/18/22 0351 (!) 101     Resp 10/18/22 0351 20     Temp 10/18/22 0351 99.9 F (37.7 C)     Temp Source 10/18/22 0351 Oral     SpO2 10/18/22 0344 99 %     Weight 10/18/22 0353 90.7 kg (200 lb)     Height 10/18/22 0353 1.829 m (6')     Head Circumference --      Peak Flow --      Pain Score 10/18/22 0352 0     Pain Loc --      Pain Edu? --      Excl. in GC? --     Most recent vital signs: Vitals:   10/18/22 0832 10/18/22 1141  BP: 139/87   Pulse: (!) 101 99  Resp: 18   Temp: 98.8 F (37.1 C)   SpO2: 95%      General: Awake, no distress.  Patient has been ambulating around the emergency department waiting room for hours awaiting to be evaluated. CV:  Good peripheral perfusion.  Borderline tachycardia, regular rhythm.  Normal heart sounds. Resp:  Normal effort.  Lungs are clear to auscultation.  Occasional dry cough, mild, but no accessory muscle usage nor intercostal retractions.  No wheezing. Abd:  No distention.  Other:  AO x 3, no focal neurological deficits, ambulatory without difficulty.   ED Results / Procedures  / Treatments   Labs (all labs ordered are listed, but only abnormal results are displayed) Labs Reviewed  RESP PANEL BY RT-PCR (RSV, FLU A&B, COVID)  RVPGX2 - Abnormal; Notable for the following components:      Result Value   Influenza A by PCR POSITIVE (*)    All other components within normal limits  CBC WITH DIFFERENTIAL/PLATELET - Abnormal; Notable for the following components:   Lymphs Abs 0.6 (*)    All other components within normal limits  COMPREHENSIVE METABOLIC PANEL - Abnormal; Notable for the following components:   Glucose, Bld 200 (*)    ALT 65 (*)    All other components within normal limits  TROPONIN I (HIGH SENSITIVITY) - Abnormal; Notable for the following components:   Troponin I (High Sensitivity) 19 (*)    All other components within normal limits  BRAIN NATRIURETIC PEPTIDE  TROPONIN I (HIGH SENSITIVITY)     EKG  ED ECG REPORT I, Loleta Rose, the attending physician, personally viewed and interpreted this ECG.  Date: 10/18/2022 EKG Time: 3:49 AM Rate: 93  Rhythm: normal sinus rhythm QRS Axis: normal Intervals: normal ST/T Wave abnormalities: normal Narrative Interpretation: no evidence of acute ischemia    RADIOLOGY I viewed and interpreted the patient's two-view chest x-ray.  There is no evidence of pneumonia or interstitial edema.  I also read the radiologist's report, which confirmed no acute findings.    PROCEDURES:  Critical Care performed: No  Procedures   MEDICATIONS ORDERED IN ED: Medications  acetaminophen (TYLENOL) tablet 650 mg (650 mg Oral Given 10/18/22 1027)     IMPRESSION / MDM / ASSESSMENT AND PLAN / ED COURSE  I reviewed the triage vital signs and the nursing notes.                              Differential diagnosis includes, but is not limited to, viral illness, community-acquired pneumonia, other infectious process such as UTI or bacteremia.  Patient's presentation is most consistent with acute presentation  with potential threat to life or bodily function.  Lab/studies ordered: Two-view chest x-ray, EKG, CMP, BNP, high-sensitivity troponin x 2, respiratory viral panel, CBC with differential.  Labs are all reassuring and essentially normal other than his respiratory viral panel which is positive for influenza A.  Chest x-ray is clear and there is no evidence of ischemia on EKG.  I talked with the patient about his results and that he does not require hospitalization or additional treatment.  I had my usual Tamiflu discussion with the patient and he does not want the medication which I think is very reasonable and appropriate.  He will manage his symptoms as an outpatient and I gave my usual and customary return precautions.        FINAL CLINICAL IMPRESSION(S) / ED DIAGNOSES   Final diagnoses:  Influenza A     Rx / DC Orders   ED Discharge Orders     None        Note:  This document was prepared using Dragon voice recognition software and may include unintentional dictation errors.   Loleta Rose, MD 10/18/22 [phone]

## 2022-10-18 NOTE — ED Notes (Signed)
Pt given a cup of water at this time per pt request.

## 2022-11-17 ENCOUNTER — Emergency Department
Admission: EM | Admit: 2022-11-17 | Discharge: 2022-11-17 | Disposition: A | Discharge status: Home / Self Care | Payer: No Typology Code available for payment source | Attending: Emergency Medicine | Admitting: Emergency Medicine

## 2022-11-17 ENCOUNTER — Other Ambulatory Visit: Payer: Self-pay

## 2022-11-17 ENCOUNTER — Emergency Department: Payer: No Typology Code available for payment source

## 2022-11-17 DIAGNOSIS — R079 Chest pain, unspecified: Secondary | ICD-10-CM | POA: Diagnosis present

## 2022-11-17 DIAGNOSIS — R1013 Epigastric pain: Secondary | ICD-10-CM | POA: Diagnosis not present

## 2022-11-17 DIAGNOSIS — R0789 Other chest pain: Secondary | ICD-10-CM | POA: Diagnosis not present

## 2022-11-17 DIAGNOSIS — I10 Essential (primary) hypertension: Secondary | ICD-10-CM | POA: Insufficient documentation

## 2022-11-17 LAB — COMPREHENSIVE METABOLIC PANEL
ALT: 31 U/L (ref 0–44)
AST: 20 U/L (ref 15–41)
Albumin: 3.8 g/dL (ref 3.5–5.0)
Alkaline Phosphatase: 86 U/L (ref 38–126)
Anion gap: 8 (ref 5–15)
BUN: 13 mg/dL (ref 8–23)
CO2: 23 mmol/L (ref 22–32)
Calcium: 8.7 mg/dL — ABNORMAL LOW (ref 8.9–10.3)
Chloride: 108 mmol/L (ref 98–111)
Creatinine, Ser: 0.88 mg/dL (ref 0.61–1.24)
GFR, Estimated: >60 mL/min (ref >60)
Glucose, Bld: 170 mg/dL — ABNORMAL HIGH (ref 70–99)
Potassium: 3.4 mmol/L — ABNORMAL LOW (ref 3.5–5.1)
Sodium: 139 mmol/L (ref 135–145)
Total Bilirubin: 0.6 mg/dL (ref 0.3–1.2)
Total Protein: 7.5 g/dL (ref 6.5–8.1)

## 2022-11-17 LAB — CBC WITH DIFFERENTIAL/PLATELET
Abs Immature Granulocytes: 0.01 10*3/uL (ref 0.00–0.07)
Basophils Absolute: 0.1 10*3/uL (ref 0.0–0.1)
Basophils Relative: 1 %
Eosinophils Absolute: 0.3 10*3/uL (ref 0.0–0.5)
Eosinophils Relative: 4 %
HCT: 46.9 % (ref 39.0–52.0)
Hemoglobin: 15.1 g/dL (ref 13.0–17.0)
Immature Granulocytes: 0 %
Lymphocytes Relative: 32 %
Lymphs Abs: 2 10*3/uL (ref 0.7–4.0)
MCH: 27.5 pg (ref 26.0–34.0)
MCHC: 32.2 g/dL (ref 30.0–36.0)
MCV: 85.4 fL (ref 80.0–100.0)
Monocytes Absolute: 0.6 10*3/uL (ref 0.1–1.0)
Monocytes Relative: 9 %
Neutro Abs: 3.4 10*3/uL (ref 1.7–7.7)
Neutrophils Relative %: 54 %
Platelets: 174 10*3/uL (ref 150–400)
RBC: 5.49 MIL/uL (ref 4.22–5.81)
RDW: 15.2 % (ref 11.5–15.5)
WBC: 6.3 10*3/uL (ref 4.0–10.5)
nRBC: 0 % (ref 0.0–0.2)

## 2022-11-17 LAB — D-DIMER, QUANTITATIVE: D-Dimer, Quant: 0.41 ug/mL-FEU (ref 0.00–0.50)

## 2022-11-17 LAB — TROPONIN I (HIGH SENSITIVITY): Troponin I (High Sensitivity): 19 ng/L — ABNORMAL HIGH (ref <18)

## 2022-11-17 MED ORDER — OMEPRAZOLE MAGNESIUM 20 MG PO TBEC: 20.0000 mg | DELAYED_RELEASE_TABLET | Freq: Every day | ORAL | 1 refills | Status: DC

## 2022-11-17 NOTE — ED Provider Notes (Signed)
[facility] Provider Note    Event Date/Time   First MD Initiated Contact with Patient 11/17/22 1102     (approximate)   History   Chest Pain   HPI  Douglas Smith is a 72 y.o. male history of DVT, hypertension, PE who presents with complaints of epigastric discomfort which started around 8 PM last night while he was watching TV after eating dinner.  He reports he still had some discomfort this morning but now it is resolved.  No shortness of breath, no pleurisy.  No calf pain or swelling.     Physical Exam   Triage Vital Signs: ED Triage Vitals  Enc Vitals Group     BP 11/17/22 0617 (!) 147/109     Pulse Rate 11/17/22 0617 84     Resp 11/17/22 0617 20     Temp 11/17/22 0617 98 F (36.7 C)     Temp Source 11/17/22 0617 Oral     SpO2 11/17/22 0617 98 %     Weight 11/17/22 0618 88.5 kg (195 lb)     Height 11/17/22 0618 1.803 m (5\' 11" )     Head Circumference --      Peak Flow --      Pain Score 11/17/22 0618 8     Pain Loc --      Pain Edu? --      Excl. in [place]? --     Most recent vital signs: Vitals:   11/17/22 1134 11/17/22 1135  BP: (!) 148/98 (!) 148/98  Pulse: 78 78  Resp: 18 20  Temp:  98.3 F (36.8 C)  SpO2: 100% 100%     General: Awake, no distress.  CV:  Good peripheral perfusion.  Regular rate and rhythm Resp:  Normal effort.  Clear to auscultation bilaterally Abd:  No distention.  Other:  No calf pain or swelling   ED Results / Procedures / Treatments   Labs (all labs ordered are listed, but only abnormal results are displayed) Labs Reviewed  COMPREHENSIVE METABOLIC PANEL - Abnormal; Notable for the following components:      Result Value   Potassium 3.4 (*)    Glucose, Bld 170 (*)    Calcium 8.7 (*)    All other components within normal limits  TROPONIN I (HIGH SENSITIVITY) - Abnormal; Notable for the following components:   Troponin I (High Sensitivity) 19 (*)    All other components within normal limits   CBC WITH DIFFERENTIAL/PLATELET  D-DIMER, QUANTITATIVE     EKG  ED ECG REPORT I, Lavonia Drafts, the attending physician, personally viewed and interpreted this ECG.  Date: 11/17/2022  Rhythm: normal sinus rhythm QRS Axis: normal Intervals: normal ST/T Wave abnormalities: normal Narrative Interpretation: no evidence of acute ischemia    RADIOLOGY Chest x-ray viewed and interpreted by me, no pneumonia    PROCEDURES:  Critical Care performed:   Procedures   MEDICATIONS ORDERED IN ED: Medications - No data to display   IMPRESSION / MDM / [place] / ED COURSE  I reviewed the triage vital signs and the nursing notes. Patient's presentation is most consistent with acute presentation with potential threat to life or bodily function.  Patient presents with chest discomfort/epigastric discomfort as detailed above.  Differential includes ACS, angina, GERD, esophagitis  Overall well-appearing and asymptomatic here in the emergency department, EKG high sensitive troponin are unremarkable.  Chest x-ray is without evidence of pneumonia or other abnormality.  HPI is most suspicious for  GERD however I would like the patient to follow-up closely with the [facility] for further evaluation  He knows to return to the emergency department if any change in his symptoms.  Will start the patient on Prilosec        FINAL CLINICAL IMPRESSION(S) / ED DIAGNOSES   Final diagnoses:  Nonspecific chest pain     Rx / DC Orders   ED Discharge Orders          Ordered    omeprazole (PRILOSEC OTC) 20 MG tablet  Daily        11/17/22 1115             Note:  This document was prepared using Dragon voice recognition software and may include unintentional dictation errors.   Lavonia Drafts, MD 11/17/22 [phone]

## 2022-11-17 NOTE — ED Notes (Signed)
Pt ambulated independently to restroom

## 2022-11-17 NOTE — ED Notes (Signed)
Pt verbalizes understanding of discharge instructions. Opportunity for questioning and answers were provided. Pt discharged from ED to home.   ? ?

## 2022-11-17 NOTE — ED Triage Notes (Addendum)
Pt to ED via POV with c/o central chest pain that started last night around 8pm. Pt reports pain was worse when he woke up.  Pt denies dizziness, lightheadedness, SOB, N/V. Hx of COPD and PE. Pt takes eliquis. Ambulatory to triage

## 2023-03-06 ENCOUNTER — Other Ambulatory Visit: Payer: Self-pay

## 2023-03-06 ENCOUNTER — Emergency Department
Admission: EM | Admit: 2023-03-06 | Discharge: 2023-03-06 | Disposition: A | Discharge status: Home / Self Care | Payer: No Typology Code available for payment source | Attending: Emergency Medicine | Admitting: Emergency Medicine

## 2023-03-06 ENCOUNTER — Encounter: Payer: Self-pay | Admitting: Emergency Medicine

## 2023-03-06 DIAGNOSIS — R1013 Epigastric pain: Secondary | ICD-10-CM | POA: Diagnosis present

## 2023-03-06 DIAGNOSIS — I10 Essential (primary) hypertension: Secondary | ICD-10-CM | POA: Insufficient documentation

## 2023-03-06 DIAGNOSIS — F17200 Nicotine dependence, unspecified, uncomplicated: Secondary | ICD-10-CM | POA: Diagnosis not present

## 2023-03-06 DIAGNOSIS — K219 Gastro-esophageal reflux disease without esophagitis: Secondary | ICD-10-CM | POA: Insufficient documentation

## 2023-03-06 LAB — URINALYSIS, ROUTINE W REFLEX MICROSCOPIC
Bilirubin Urine: NEGATIVE
Glucose, UA: >1000 mg/dL — AB
Ketones, ur: NEGATIVE mg/dL
Leukocytes,Ua: NEGATIVE
Nitrite: NEGATIVE
Specific Gravity, Urine: 1.02 (ref 1.005–1.030)
pH: 6 (ref 5.0–8.0)

## 2023-03-06 LAB — COMPREHENSIVE METABOLIC PANEL
ALT: 38 U/L (ref 0–44)
AST: 22 U/L (ref 15–41)
Albumin: 3.9 g/dL (ref 3.5–5.0)
Alkaline Phosphatase: 107 U/L (ref 38–126)
Anion gap: 6 (ref 5–15)
BUN: 11 mg/dL (ref 8–23)
CO2: 23 mmol/L (ref 22–32)
Calcium: 8.7 mg/dL — ABNORMAL LOW (ref 8.9–10.3)
Chloride: 109 mmol/L (ref 98–111)
Creatinine, Ser: 0.93 mg/dL (ref 0.61–1.24)
GFR, Estimated: >60 mL/min (ref >60)
Glucose, Bld: 234 mg/dL — ABNORMAL HIGH (ref 70–99)
Potassium: 3.6 mmol/L (ref 3.5–5.1)
Sodium: 138 mmol/L (ref 135–145)
Total Bilirubin: 0.8 mg/dL (ref 0.3–1.2)
Total Protein: 7.5 g/dL (ref 6.5–8.1)

## 2023-03-06 LAB — CBC WITH DIFFERENTIAL/PLATELET
Abs Immature Granulocytes: 0.03 10*3/uL (ref 0.00–0.07)
Basophils Absolute: 0.1 10*3/uL (ref 0.0–0.1)
Basophils Relative: 1 %
Eosinophils Absolute: 0.1 10*3/uL (ref 0.0–0.5)
Eosinophils Relative: 1 %
HCT: 48.1 % (ref 39.0–52.0)
Hemoglobin: 15.3 g/dL (ref 13.0–17.0)
Immature Granulocytes: 0 %
Lymphocytes Relative: 14 %
Lymphs Abs: 1.2 10*3/uL (ref 0.7–4.0)
MCH: 27.5 pg (ref 26.0–34.0)
MCHC: 31.8 g/dL (ref 30.0–36.0)
MCV: 86.5 fL (ref 80.0–100.0)
Monocytes Absolute: 0.4 10*3/uL (ref 0.1–1.0)
Monocytes Relative: 5 %
Neutro Abs: 6.6 10*3/uL (ref 1.7–7.7)
Neutrophils Relative %: 79 %
Platelets: 199 10*3/uL (ref 150–400)
RBC: 5.56 MIL/uL (ref 4.22–5.81)
RDW: 15.1 % (ref 11.5–15.5)
WBC: 8.4 10*3/uL (ref 4.0–10.5)
nRBC: 0 % (ref 0.0–0.2)

## 2023-03-06 LAB — LIPASE, BLOOD: Lipase: 39 U/L (ref 11–51)

## 2023-03-06 MED ORDER — OMEPRAZOLE MAGNESIUM 20 MG PO TBEC: 20.0000 mg | DELAYED_RELEASE_TABLET | Freq: Every day | ORAL | 1 refills | Status: DC

## 2023-03-06 NOTE — ED Triage Notes (Signed)
Patient ambulatory to triage with steady gait, without difficulty or distress noted; pt reports mid abd pain accomp by nausea and vomited x 2l st hx of same and was rx omeprazole in past

## 2023-03-06 NOTE — ED Provider Notes (Signed)
[facility] Provider Note    Event Date/Time   First MD Initiated Contact with Patient 03/06/23 [phone]     (approximate)   History   Abdominal Pain   HPI  Douglas Smith is a 72 y.o. male with history of hypertension, DVT, PE, tobacco abuse who presents with complaints of epigastric discomfort which started overnight.  Patient attributes that to acid reflux which he has had in the past.  He took an omeprazole but reports he has not been using it regularly.  He is feeling fine now, has no complaints     Physical Exam   Triage Vital Signs: ED Triage Vitals  Enc Vitals Group     BP 03/06/23 0554 (!) 151/100     Pulse Rate 03/06/23 0554 83     Resp 03/06/23 0554 18     Temp 03/06/23 0554 98.1 F (36.7 C)     Temp Source 03/06/23 0554 Oral     SpO2 03/06/23 0554 94 %     Weight 03/06/23 0553 86.2 kg (190 lb)     Height 03/06/23 0553 1.803 m (5\' 11" )     Head Circumference --      Peak Flow --      Pain Score 03/06/23 0553 9     Pain Loc --      Pain Edu? --      Excl. in GC? --     Most recent vital signs: Vitals:   03/06/23 0554  BP: (!) 151/100  Pulse: 83  Resp: 18  Temp: 98.1 F (36.7 C)  SpO2: 94%     General: Awake, no distress.  CV:  Good peripheral perfusion.  Resp:  Normal effort.  Auscultation bilaterally Abd:  No distention.  Soft, nontender Other:     ED Results / Procedures / Treatments   Labs (all labs ordered are listed, but only abnormal results are displayed) Labs Reviewed  COMPREHENSIVE METABOLIC PANEL - Abnormal; Notable for the following components:      Result Value   Glucose, Bld 234 (*)    Calcium 8.7 (*)    All other components within normal limits  CBC WITH DIFFERENTIAL/PLATELET  LIPASE, BLOOD  URINALYSIS, ROUTINE W REFLEX MICROSCOPIC     EKG  ED ECG REPORT I, Jene Every, the attending physician, personally viewed and interpreted this ECG.  Date: 03/06/2023  Rhythm: normal sinus  rhythm QRS Axis: normal Intervals: normal ST/T Wave abnormalities: normal Narrative Interpretation: no evidence of acute ischemia    RADIOLOGY     PROCEDURES:  Critical Care performed:   Procedures   MEDICATIONS ORDERED IN ED: Medications - No data to display   IMPRESSION / MDM / ASSESSMENT AND PLAN / ED COURSE  I reviewed the triage vital signs and the nursing notes. Patient's presentation is most consistent with acute illness / injury with system symptoms.   Patient presents with epigastric discomfort as above, now resolved.  Started in the middle of the night, he reports it feels similar to prior episodes of acid reflux, he took his omeprazole and is now feeling improved but at the time it did not seem to help.  He does not take it regularly.  No chest pain, no shortness of breath reported   Lab work reviewed and is quite reassuring, EKG nonischemic.  Given patient is feeling improved, asymptomatic, recommend taking Prilosec daily for 2 weeks, return precautions discussed, patient agrees this plan.       FINAL CLINICAL IMPRESSION(S) /  ED DIAGNOSES   Final diagnoses:  Gastroesophageal reflux disease, unspecified whether esophagitis present     Rx / DC Orders   ED Discharge Orders          Ordered    omeprazole (PRILOSEC OTC) 20 MG tablet  Daily        03/06/23 0719             Note:  This document was prepared using Dragon voice recognition software and may include unintentional dictation errors.   Jene Every, MD 03/06/23 [phone]

## 2023-03-06 NOTE — ED Notes (Signed)
Rainbow sent to the lab at this time.  

## 2023-09-03 ENCOUNTER — Ambulatory Visit
Admission: EM | Admit: 2023-09-03 | Discharge: 2023-09-03 | Disposition: A | Discharge status: Home / Self Care | Payer: No Typology Code available for payment source | Attending: Family Medicine | Admitting: Family Medicine

## 2023-09-03 DIAGNOSIS — R3129 Other microscopic hematuria: Secondary | ICD-10-CM | POA: Insufficient documentation

## 2023-09-03 DIAGNOSIS — R350 Frequency of micturition: Secondary | ICD-10-CM | POA: Insufficient documentation

## 2023-09-03 DIAGNOSIS — Z7901 Long term (current) use of anticoagulants: Secondary | ICD-10-CM | POA: Diagnosis not present

## 2023-09-03 DIAGNOSIS — E1169 Type 2 diabetes mellitus with other specified complication: Secondary | ICD-10-CM

## 2023-09-03 LAB — URINALYSIS, W/ REFLEX TO CULTURE (INFECTION SUSPECTED)
Bilirubin Urine: NEGATIVE
Glucose, UA: >=500 mg/dL — AB
Ketones, ur: NEGATIVE mg/dL
Leukocytes,Ua: NEGATIVE
Nitrite: NEGATIVE
Protein, ur: NEGATIVE mg/dL
Specific Gravity, Urine: 1.025 (ref 1.005–1.030)
pH: 5.5 (ref 5.0–8.0)

## 2023-09-03 NOTE — ED Triage Notes (Signed)
Urinary frequency x 2 weeks. Only happening at night.

## 2023-09-03 NOTE — ED Provider Notes (Signed)
[facility]    CSN: [id number] Arrival date & time: 09/03/23  1628      History   Chief Complaint Chief Complaint  Patient presents with   Urinary Frequency     HPI HPI Douglas Smith is a 72 y.o. male.   History provided by patient and his daughter Douglas Smith presents for urinary frequency for 3-4 weeks. Symptoms get worse at night .  Recently started a new diabetes medication around the same time. He drinks fluids up until he goes to bed. Has not had any antibiotics in last 30 days.  He has not had his PSA checked.  Denies fever, nausea, new back pain, abdominal pain, flank pain and hematuria.         Past Medical History:  Diagnosis Date   DVT (deep venous thrombosis) (HCC)    HTN (hypertension)    PE (pulmonary thromboembolism) (HCC)    Tobacco abuse     Patient Active Problem List   Diagnosis Date Noted   TIA (transient ischemic attack) 04/27/2021   Acute pulmonary embolism (HCC) 04/12/2021   Tobacco abuse 04/12/2021   DVT (deep venous thrombosis) (HCC) 04/12/2021   HTN (hypertension)    Lung nodule    Thyroid nodule    Leukocytosis    Hypokalemia    Elevated troponin     Past Surgical History:  Procedure Laterality Date   left shoulder surgery Left        Home Medications    Prior to Admission medications   Medication Sig Start Date End Date Taking? Authorizing Provider  atorvastatin (LIPITOR) 40 MG tablet Take 1 tablet (40 mg total) by mouth daily. 04/29/21 09/03/23 Yes Masoud, Dahlia Client, MD  empagliflozin (JARDIANCE) 25 MG TABS tablet Take by mouth. 08/15/23  Yes [provider]  glipiZIDE (GLUCOTROL) 10 MG tablet Take by mouth. 08/15/23  Yes [provider]  losartan (COZAAR) 50 MG tablet Take by mouth. 08/15/23  Yes [provider]  omeprazole (PRILOSEC OTC) 20 MG tablet Take 1 tablet (20 mg total) by mouth daily. 03/06/23 03/05/24 Yes Jene Every, MD  albuterol (PROVENTIL) (2.5 MG/3ML) 0.083%  nebulizer solution Take 3 mLs (2.5 mg total) by nebulization every 4 (four) hours as needed for wheezing or shortness of breath. 10/12/21   Irean Hong, MD  albuterol (VENTOLIN HFA) 108 (90 Base) MCG/ACT inhaler Inhale 2 puffs into the lungs every 4 (four) hours as needed for wheezing or shortness of breath. 10/12/21   Irean Hong, MD  apixaban (ELIQUIS) 5 MG TABS tablet Take 1 tablet (5 mg total) by mouth 2 (two) times daily. 04/28/21 05/28/21  Baldwin Jamaica, MD  hydrochlorothiazide (HYDRODIURIL) 25 MG tablet Take 25 mg by mouth daily. 06/15/20   [provider]  Nebulizers (COMPRESSOR/NEBULIZER) MISC 1 Units by Does not apply route every 4 (four) hours as needed. 10/12/21   Irean Hong, MD  nicotine (NICODERM CQ - DOSED IN MG/24 HOURS) 21 mg/24hr patch Place 1 patch (21 mg total) onto the skin daily. 04/15/21   Gillis Santa, MD    Family History Family History  Problem Relation Age of Onset   Stroke Brother     Social History Social History   Tobacco Use   Smoking status: Former    Types: Cigarettes   Smokeless tobacco: Never  Vaping Use   Vaping status: Never Used  Substance Use Topics   Alcohol use: Not Currently   Drug use: Never     Allergies  Patient has no known allergies.   Review of Systems Review of Systems: :negative unless otherwise stated in HPI.      Physical Exam Triage Vital Signs ED Triage Vitals  Encounter Vitals Group     BP 09/03/23 1754 (!) 127/90     Systolic BP Percentile --      Diastolic BP Percentile --      Pulse Rate 09/03/23 1754 79     Resp 09/03/23 1754 18     Temp 09/03/23 1754 98.3 F (36.8 C)     Temp Source 09/03/23 1754 Oral     SpO2 09/03/23 1754 95 %     Weight --      Height --      Head Circumference --      Peak Flow --      Pain Score 09/03/23 1751 0     Pain Loc --      Pain Education --      Exclude from Growth Chart --    No data found.  Updated Vital Signs BP (!) 127/90 (BP Location: Right Arm)    Pulse 79   Temp 98.3 F (36.8 C) (Oral)   Resp 18   SpO2 95%   Visual Acuity Right Eye Distance:   Left Eye Distance:   Bilateral Distance:    Right Eye Near:   Left Eye Near:    Bilateral Near:     Physical Exam GEN: well appearing male in no acute distress  CVS: well perfused, regular rate and rhythm RESP: speaking in full sentences without pause, clear bilaterally  ABD: soft, non-tender, non-distended, no palpable masses   SKIN: warm, dry    UC Treatments / Results  Labs (all labs ordered are listed, but only abnormal results are displayed) Labs Reviewed  URINALYSIS, W/ REFLEX TO CULTURE (INFECTION SUSPECTED) - Abnormal; Notable for the following components:      Result Value   Glucose, UA >=500 (*)    Hgb urine dipstick SMALL (*)    Bacteria, UA FEW (*)    All other components within normal limits    EKG   Radiology No results found.  Procedures Procedures (including critical care time)  Medications Ordered in UC Medications - No data to display  Initial Impression / Assessment and Plan / UC Course  I have reviewed the triage vital signs and the nursing notes.  Pertinent labs & imaging results that were available during my care of the patient were reviewed by me and considered in my medical decision making (see chart for details).       Patient is a 72 y.o. male  who presents for 4 weeks of urinary frequency that gets worse at night.   Overall patient is well-appearing and afebrile.  Vital signs stable.  UA not consistent with acute cystitis.  Antibiotics deferred.  Hematuria supported on microscopy.  Takes Eliquis for history of PE/DVT.  He is a former smoker.  I suspect that his hematuria is related to the Eliquis use but recommended he follow-up with a urologist to be evaluated for possible bladder cancer.  On chart review, he has had hematuria since repeatedly in the past.  Type 2 diabetes: He has glucose urea and his last A1c was elevated per  daughter.  He is taking glipizide and Jardiance.  Explained that [place] Pepper is not necessarily recommended after the age of 94 as it may cause hypoglycemia.  Speak with his primary care doctor about alternatives for this medication.  States he is no longer taking the losartan.  His blood pressure today is 127/90.  The [place] Pepper is providing some renal protection here.  Returning to his original complaint of urinary frequency.  Explained that his elevated blood sugars could be causing urinary frequency.  Additionally, if he is drinking up until bed he may be waking up more at nighttime. Given that he is also taking Jardiance this is another cause of urinary frequency.  Follow-up with his primary care doctor about this.   Return precautions including abdominal pain, fever, chills, nausea, or vomiting given. Follow-up,  if symptoms not improving or getting worse. Discussed MDM, treatment plan and plan for follow-up with patient and his daughter who agree with plan.        Final Clinical Impressions(s) / UC Diagnoses   Final diagnoses:  Urinary frequency  Microscopic hematuria  Chronic anticoagulation     Discharge Instructions      Follow up with your primary care provider or a urologist to discuss a PSA (prostate-specific antigen) test.  Avoid drinking any fluids 2 hours before you lay down to prevent any increased urination related to oral intake.  One of your medicines, [place] Pepper may be causing some of your symptoms.   There is a small amount of blood in your urine which is likely related to your Eliquis use as this is a common side effect.   Your urine sample did not show any evidence of a urinary tract infection.  Additionally, glipizide is not recommended for those over the age of 23 due to risk of hypoglycemia.  Speak with your primary care doctor about alternatives for this medication.      ED Prescriptions   None    PDMP not reviewed this encounter.   Katha Cabal,  DO 09/08/23 [phone]

## 2023-09-03 NOTE — Discharge Instructions (Addendum)
Follow up with your primary care provider or a urologist to discuss a PSA (prostate-specific antigen) test.  Avoid drinking any fluids 2 hours before you lay down to prevent any increased urination related to oral intake.  One of your medicines, [place] Pepper may be causing some of your symptoms.   There is a small amount of blood in your urine which is likely related to your Eliquis use as this is a common side effect.   Your urine sample did not show any evidence of a urinary tract infection.  Additionally, glipizide is not recommended for those over the age of 35 due to risk of hypoglycemia.  Speak with your primary care doctor about alternatives for this medication.

## 2024-02-02 ENCOUNTER — Other Ambulatory Visit: Payer: Self-pay

## 2024-02-02 ENCOUNTER — Emergency Department
Admission: EM | Admit: 2024-02-02 | Discharge: 2024-02-02 | Disposition: A | Discharge status: Home / Self Care | Attending: Emergency Medicine | Admitting: Emergency Medicine

## 2024-02-02 ENCOUNTER — Encounter: Payer: Self-pay | Admitting: Intensive Care

## 2024-02-02 ENCOUNTER — Emergency Department

## 2024-02-02 DIAGNOSIS — I10 Essential (primary) hypertension: Secondary | ICD-10-CM | POA: Diagnosis not present

## 2024-02-02 DIAGNOSIS — R1012 Left upper quadrant pain: Secondary | ICD-10-CM | POA: Diagnosis not present

## 2024-02-02 DIAGNOSIS — R1013 Epigastric pain: Secondary | ICD-10-CM | POA: Insufficient documentation

## 2024-02-02 DIAGNOSIS — E119 Type 2 diabetes mellitus without complications: Secondary | ICD-10-CM | POA: Diagnosis not present

## 2024-02-02 DIAGNOSIS — R109 Unspecified abdominal pain: Secondary | ICD-10-CM

## 2024-02-02 HISTORY — DX: Type 2 diabetes mellitus without complications: E11.9

## 2024-02-02 LAB — URINALYSIS, ROUTINE W REFLEX MICROSCOPIC
Bacteria, UA: NONE SEEN
Bilirubin Urine: NEGATIVE
Glucose, UA: >=500 mg/dL — AB
Ketones, ur: NEGATIVE mg/dL
Leukocytes,Ua: NEGATIVE
Nitrite: NEGATIVE
Protein, ur: NEGATIVE mg/dL
Specific Gravity, Urine: 1.015 (ref 1.005–1.030)
pH: 8 (ref 5.0–8.0)

## 2024-02-02 LAB — COMPREHENSIVE METABOLIC PANEL WITH GFR
ALT: 29 U/L (ref 0–44)
AST: 22 U/L (ref 15–41)
Albumin: 4 g/dL (ref 3.5–5.0)
Alkaline Phosphatase: 84 U/L (ref 38–126)
Anion gap: 9 (ref 5–15)
BUN: 11 mg/dL (ref 8–23)
CO2: 23 mmol/L (ref 22–32)
Calcium: 9.2 mg/dL (ref 8.9–10.3)
Chloride: 108 mmol/L (ref 98–111)
Creatinine, Ser: 1.07 mg/dL (ref 0.61–1.24)
GFR, Estimated: >60 mL/min (ref >60)
Glucose, Bld: 142 mg/dL — ABNORMAL HIGH (ref 70–99)
Potassium: 3.4 mmol/L — ABNORMAL LOW (ref 3.5–5.1)
Sodium: 140 mmol/L (ref 135–145)
Total Bilirubin: 0.8 mg/dL (ref 0.0–1.2)
Total Protein: 7.7 g/dL (ref 6.5–8.1)

## 2024-02-02 LAB — CBC
HCT: 49.4 % (ref 39.0–52.0)
Hemoglobin: 16 g/dL (ref 13.0–17.0)
MCH: 26.5 pg (ref 26.0–34.0)
MCHC: 32.4 g/dL (ref 30.0–36.0)
MCV: 81.8 fL (ref 80.0–100.0)
Platelets: 220 10*3/uL (ref 150–400)
RBC: 6.04 MIL/uL — ABNORMAL HIGH (ref 4.22–5.81)
RDW: 17.4 % — ABNORMAL HIGH (ref 11.5–15.5)
WBC: 7.6 10*3/uL (ref 4.0–10.5)
nRBC: 0 % (ref 0.0–0.2)

## 2024-02-02 LAB — TROPONIN I (HIGH SENSITIVITY): Troponin I (High Sensitivity): 16 ng/L (ref <18)

## 2024-02-02 LAB — LIPASE, BLOOD: Lipase: 32 U/L (ref 11–51)

## 2024-02-02 MED ORDER — ALUM & MAG HYDROXIDE-SIMETH 200-200-20 MG/5ML PO SUSP
30.0000 mL | Freq: Once | ORAL | Status: AC
  Administered 2024-02-02: 30 mL via ORAL
  Filled 2024-02-02: qty 30

## 2024-02-02 MED ORDER — LIDOCAINE VISCOUS HCL 2 % MT SOLN
15.0000 mL | Freq: Once | OROMUCOSAL | Status: AC
  Administered 2024-02-02: 15 mL via ORAL
  Filled 2024-02-02: qty 15

## 2024-02-02 MED ORDER — SODIUM CHLORIDE 0.9 % IV BOLUS
1000.0000 mL | Freq: Once | INTRAVENOUS | Status: AC
  Administered 2024-02-02: 1000 mL via INTRAVENOUS

## 2024-02-02 MED ORDER — FENTANYL CITRATE PF 50 MCG/ML IJ SOSY
50.0000 ug | PREFILLED_SYRINGE | Freq: Once | INTRAMUSCULAR | Status: AC
  Administered 2024-02-02: 50 ug via INTRAVENOUS
  Filled 2024-02-02: qty 1

## 2024-02-02 MED ORDER — IOHEXOL 300 MG/ML  SOLN
100.0000 mL | Freq: Once | INTRAMUSCULAR | Status: AC | PRN
  Administered 2024-02-02: 100 mL via INTRAVENOUS

## 2024-02-02 NOTE — ED Provider Notes (Signed)
 [facility] Provider Note    Event Date/Time   First MD Initiated Contact with Patient 02/02/24 1733     (approximate)   History   Abdominal Pain   HPI  Douglas Smith is a 73 y.o. male who presents to the emergency department today because of concerns.  Patient states that it started today.  Located in the epigastric region. The pain is severe. Has been constant. Tried taking omeprazole without any relief. Patient denies any diarrhea, black or tarry stools. States he has had similar symptoms in the past. Denies any fevers or chills.      Physical Exam   Triage Vital Signs: ED Triage Vitals [02/02/24 1644]  Encounter Vitals Group     BP (!) 174/106     Systolic BP Percentile      Diastolic BP Percentile      Pulse Rate 77     Resp (!) 22     Temp 97.6 F (36.4 C)     Temp Source Oral     SpO2 97 %     Weight 185 lb (83.9 kg)     Height 6' (1.829 m)     Head Circumference      Peak Flow      Pain Score 10     Pain Loc      Pain Education      Exclude from Growth Chart     Most recent vital signs: Vitals:   02/02/24 1644  BP: (!) 174/106  Pulse: 77  Resp: (!) 22  Temp: 97.6 F (36.4 C)  SpO2: 97%   General: Awake, alert, oriented. CV:  Good peripheral perfusion. Regular rate and rhythm. Resp:  Normal effort. Lungs clear. Abd:  No distention. Tender to palpation in the epigastric and left upper quadrant.  ED Results / Procedures / Treatments   Labs (all labs ordered are listed, but only abnormal results are displayed) Labs Reviewed  CBC - Abnormal; Notable for the following components:      Result Value   RBC 6.04 (*)    RDW 17.4 (*)    All other components within normal limits  URINALYSIS, ROUTINE W REFLEX MICROSCOPIC - Abnormal; Notable for the following components:   Color, Urine STRAW (*)    APPearance CLEAR (*)    Glucose, UA >=500 (*)    Hgb urine dipstick SMALL (*)    All other components within normal limits   COMPREHENSIVE METABOLIC PANEL WITH GFR  LIPASE, BLOOD  TROPONIN I (HIGH SENSITIVITY)     EKG  I, Marylynn Soho, attending physician, personally viewed and interpreted this EKG  EKG Time: 1649 Rate: 85 Rhythm: normal sinus rhythm Axis: normal Intervals: qtc 490 QRS: narrow ST changes: no st elevation Impression: abnormal ekg   RADIOLOGY I independently interpreted and visualized the CT abd/pel. My interpretation: No free air. Radiology interpretation:  IMPRESSION:  1. Cholelithiasis.  2. Renal cysts.  3. Diverticulosis.  4. Prostatomegaly.  5. Aortic atherosclerosis.      PROCEDURES:  Critical Care performed: No    MEDICATIONS ORDERED IN ED: Medications - No data to display   IMPRESSION / MDM / ASSESSMENT AND PLAN / ED COURSE  I reviewed the triage vital signs and the nursing notes.                              Differential diagnosis includes, but is not limited to, pancreatitis, gastritis,  gastroenteritis  Patient's presentation is most consistent with acute presentation with potential threat to life or bodily function.   Patient presented to the emergency department today because of concerns for epigastric abdominal pain.  On exam patient is tender in the epigastric region.  Will check blood work.  Will give GI cocktail.  Patient without any sniffing relief after GI cocktail.  I then ordered a CT scan.  It does show gallstones but no acute concerning findings.  At this time I have a low concern for cholecystitis given lack of right upper quadrant tenderness, leukocytosis.  He did feel better after IV fluids and narcotics.  At this time I think it is reasonable for patient be discharged home.   FINAL CLINICAL IMPRESSION(S) / ED DIAGNOSES   Final diagnoses:  Abdominal pain, unspecified abdominal location     Note:  This document was prepared using Dragon voice recognition software and may include unintentional dictation errors.    Marylynn Soho, MD 02/02/24 2009

## 2024-02-02 NOTE — ED Triage Notes (Signed)
 Arrives via [facility] from home c/o Epigastric pain and vomiting. Hx DM, Blood clots, HTN.  VS wnl.  158/105  12 lead WNL 96% RA 23 CO  NKA

## 2024-02-02 NOTE — ED Triage Notes (Signed)
 Arrived by [facility]. Patient presents with epigastric pain with N/V that started around 2 hours ago.   History same about a year ago

## 2024-02-20 DIAGNOSIS — Z8673 Personal history of transient ischemic attack (TIA), and cerebral infarction without residual deficits: Secondary | ICD-10-CM

## 2024-02-20 DIAGNOSIS — R911 Solitary pulmonary nodule: Secondary | ICD-10-CM | POA: Diagnosis present

## 2024-02-20 DIAGNOSIS — Z87891 Personal history of nicotine dependence: Secondary | ICD-10-CM

## 2024-02-20 DIAGNOSIS — A419 Sepsis, unspecified organism: Principal | ICD-10-CM | POA: Diagnosis present

## 2024-02-20 DIAGNOSIS — Z7901 Long term (current) use of anticoagulants: Secondary | ICD-10-CM

## 2024-02-20 DIAGNOSIS — Z7984 Long term (current) use of oral hypoglycemic drugs: Secondary | ICD-10-CM

## 2024-02-20 DIAGNOSIS — E119 Type 2 diabetes mellitus without complications: Secondary | ICD-10-CM | POA: Diagnosis present

## 2024-02-20 DIAGNOSIS — K8012 Calculus of gallbladder with acute and chronic cholecystitis without obstruction: Secondary | ICD-10-CM | POA: Diagnosis present

## 2024-02-20 DIAGNOSIS — Z823 Family history of stroke: Secondary | ICD-10-CM

## 2024-02-20 DIAGNOSIS — J449 Chronic obstructive pulmonary disease, unspecified: Secondary | ICD-10-CM | POA: Diagnosis present

## 2024-02-20 DIAGNOSIS — I2510 Atherosclerotic heart disease of native coronary artery without angina pectoris: Secondary | ICD-10-CM | POA: Diagnosis present

## 2024-02-20 DIAGNOSIS — R7401 Elevation of levels of liver transaminase levels: Secondary | ICD-10-CM | POA: Diagnosis present

## 2024-02-20 DIAGNOSIS — Z79899 Other long term (current) drug therapy: Secondary | ICD-10-CM

## 2024-02-20 DIAGNOSIS — E8729 Other acidosis: Secondary | ICD-10-CM | POA: Diagnosis present

## 2024-02-20 DIAGNOSIS — E878 Other disorders of electrolyte and fluid balance, not elsewhere classified: Secondary | ICD-10-CM | POA: Diagnosis present

## 2024-02-20 DIAGNOSIS — E785 Hyperlipidemia, unspecified: Secondary | ICD-10-CM | POA: Diagnosis present

## 2024-02-20 DIAGNOSIS — B9620 Unspecified Escherichia coli [E. coli] as the cause of diseases classified elsewhere: Secondary | ICD-10-CM | POA: Diagnosis present

## 2024-02-20 DIAGNOSIS — Z86718 Personal history of other venous thrombosis and embolism: Secondary | ICD-10-CM

## 2024-02-20 DIAGNOSIS — E876 Hypokalemia: Secondary | ICD-10-CM | POA: Diagnosis present

## 2024-02-20 DIAGNOSIS — I10 Essential (primary) hypertension: Secondary | ICD-10-CM | POA: Diagnosis present

## 2024-02-20 DIAGNOSIS — K810 Acute cholecystitis: Secondary | ICD-10-CM | POA: Diagnosis not present

## 2024-02-20 DIAGNOSIS — N390 Urinary tract infection, site not specified: Secondary | ICD-10-CM | POA: Diagnosis present

## 2024-02-20 DIAGNOSIS — Z86711 Personal history of pulmonary embolism: Secondary | ICD-10-CM

## 2024-02-20 LAB — COMPREHENSIVE METABOLIC PANEL WITH GFR
ALT: 134 U/L — ABNORMAL HIGH (ref 0–44)
AST: 214 U/L — ABNORMAL HIGH (ref 15–41)
Albumin: 3 g/dL — ABNORMAL LOW (ref 3.5–5.0)
Alkaline Phosphatase: 262 U/L — ABNORMAL HIGH (ref 38–126)
Anion gap: 8 (ref 5–15)
BUN: 15 mg/dL (ref 8–23)
CO2: 22 mmol/L (ref 22–32)
Calcium: 8.5 mg/dL — ABNORMAL LOW (ref 8.9–10.3)
Chloride: 110 mmol/L (ref 98–111)
Creatinine, Ser: 0.85 mg/dL (ref 0.61–1.24)
GFR, Estimated: >60 mL/min (ref >60)
Glucose, Bld: 118 mg/dL — ABNORMAL HIGH (ref 70–99)
Potassium: 3.2 mmol/L — ABNORMAL LOW (ref 3.5–5.1)
Sodium: 140 mmol/L (ref 135–145)
Total Bilirubin: 1.2 mg/dL (ref 0.0–1.2)
Total Protein: 7.3 g/dL (ref 6.5–8.1)

## 2024-02-20 LAB — CBC
HCT: 43.5 % (ref 39.0–52.0)
Hemoglobin: 13.8 g/dL (ref 13.0–17.0)
MCH: 26.3 pg (ref 26.0–34.0)
MCHC: 31.7 g/dL (ref 30.0–36.0)
MCV: 83 fL (ref 80.0–100.0)
Platelets: 423 10*3/uL — ABNORMAL HIGH (ref 150–400)
RBC: 5.24 MIL/uL (ref 4.22–5.81)
RDW: 16.5 % — ABNORMAL HIGH (ref 11.5–15.5)
WBC: 16.8 10*3/uL — ABNORMAL HIGH (ref 4.0–10.5)
nRBC: 0 % (ref 0.0–0.2)

## 2024-02-20 LAB — LIPASE, BLOOD: Lipase: 44 U/L (ref 11–51)

## 2024-02-20 LAB — TROPONIN I (HIGH SENSITIVITY)
Troponin I (High Sensitivity): 18 ng/L — ABNORMAL HIGH (ref <18)
Troponin I (High Sensitivity): 21 ng/L — ABNORMAL HIGH (ref <18)

## 2024-02-20 MED ORDER — ONDANSETRON 4 MG PO TBDP
4.0000 mg | ORAL_TABLET | Freq: Once | ORAL | Status: AC | PRN
  Administered 2024-02-20: 4 mg via ORAL
  Filled 2024-02-20: qty 1

## 2024-02-20 NOTE — ED Triage Notes (Signed)
 Pt presents to the ED via POV from home with family. Pt reports epigastic abdominal pain that he has had since 02/02/24 when he was evaluated here. Pt reports N/V.   EKG obtained and 4mg  ODT zofran  given with Mumma, MD approval.

## 2024-02-21 ENCOUNTER — Emergency Department

## 2024-02-21 ENCOUNTER — Inpatient Hospital Stay
Admission: EM | Admit: 2024-02-21 | Discharge: 2024-02-25 | DRG: 872 | Disposition: A | Discharge status: Home Health | Attending: Internal Medicine | Admitting: Internal Medicine

## 2024-02-21 ENCOUNTER — Other Ambulatory Visit: Payer: Self-pay

## 2024-02-21 ENCOUNTER — Inpatient Hospital Stay

## 2024-02-21 DIAGNOSIS — Z86718 Personal history of other venous thrombosis and embolism: Secondary | ICD-10-CM | POA: Diagnosis not present

## 2024-02-21 DIAGNOSIS — K810 Acute cholecystitis: Principal | ICD-10-CM | POA: Diagnosis present

## 2024-02-21 DIAGNOSIS — K8012 Calculus of gallbladder with acute and chronic cholecystitis without obstruction: Secondary | ICD-10-CM | POA: Diagnosis present

## 2024-02-21 DIAGNOSIS — B9620 Unspecified Escherichia coli [E. coli] as the cause of diseases classified elsewhere: Secondary | ICD-10-CM | POA: Diagnosis present

## 2024-02-21 DIAGNOSIS — J449 Chronic obstructive pulmonary disease, unspecified: Secondary | ICD-10-CM | POA: Diagnosis present

## 2024-02-21 DIAGNOSIS — E878 Other disorders of electrolyte and fluid balance, not elsewhere classified: Secondary | ICD-10-CM | POA: Diagnosis present

## 2024-02-21 DIAGNOSIS — E876 Hypokalemia: Secondary | ICD-10-CM | POA: Diagnosis present

## 2024-02-21 DIAGNOSIS — Z7901 Long term (current) use of anticoagulants: Secondary | ICD-10-CM | POA: Diagnosis not present

## 2024-02-21 DIAGNOSIS — E785 Hyperlipidemia, unspecified: Secondary | ICD-10-CM | POA: Diagnosis present

## 2024-02-21 DIAGNOSIS — I10 Essential (primary) hypertension: Secondary | ICD-10-CM | POA: Diagnosis present

## 2024-02-21 DIAGNOSIS — R911 Solitary pulmonary nodule: Secondary | ICD-10-CM | POA: Diagnosis present

## 2024-02-21 DIAGNOSIS — A419 Sepsis, unspecified organism: Secondary | ICD-10-CM

## 2024-02-21 DIAGNOSIS — Z79899 Other long term (current) drug therapy: Secondary | ICD-10-CM | POA: Diagnosis not present

## 2024-02-21 DIAGNOSIS — Z87891 Personal history of nicotine dependence: Secondary | ICD-10-CM | POA: Diagnosis not present

## 2024-02-21 DIAGNOSIS — N390 Urinary tract infection, site not specified: Secondary | ICD-10-CM | POA: Diagnosis present

## 2024-02-21 DIAGNOSIS — I2510 Atherosclerotic heart disease of native coronary artery without angina pectoris: Secondary | ICD-10-CM | POA: Diagnosis present

## 2024-02-21 DIAGNOSIS — Z86711 Personal history of pulmonary embolism: Secondary | ICD-10-CM | POA: Diagnosis not present

## 2024-02-21 DIAGNOSIS — Z8673 Personal history of transient ischemic attack (TIA), and cerebral infarction without residual deficits: Secondary | ICD-10-CM | POA: Diagnosis not present

## 2024-02-21 DIAGNOSIS — E8729 Other acidosis: Secondary | ICD-10-CM | POA: Diagnosis present

## 2024-02-21 DIAGNOSIS — E119 Type 2 diabetes mellitus without complications: Secondary | ICD-10-CM | POA: Diagnosis present

## 2024-02-21 DIAGNOSIS — Z7984 Long term (current) use of oral hypoglycemic drugs: Secondary | ICD-10-CM | POA: Diagnosis not present

## 2024-02-21 DIAGNOSIS — R7401 Elevation of levels of liver transaminase levels: Secondary | ICD-10-CM | POA: Diagnosis present

## 2024-02-21 DIAGNOSIS — Z823 Family history of stroke: Secondary | ICD-10-CM | POA: Diagnosis not present

## 2024-02-21 HISTORY — DX: Acute cholecystitis: K81.0

## 2024-02-21 HISTORY — DX: Sepsis, unspecified organism: A41.9

## 2024-02-21 LAB — CORTISOL-AM, BLOOD: Cortisol - AM: 14.6 ug/dL (ref 6.7–22.6)

## 2024-02-21 LAB — URINALYSIS, ROUTINE W REFLEX MICROSCOPIC
Bilirubin Urine: NEGATIVE
Glucose, UA: >=500 mg/dL — AB
Ketones, ur: NEGATIVE mg/dL
Leukocytes,Ua: NEGATIVE
Nitrite: POSITIVE — AB
Protein, ur: NEGATIVE mg/dL
Specific Gravity, Urine: 1.028 (ref 1.005–1.030)
pH: 5 (ref 5.0–8.0)

## 2024-02-21 LAB — CBG MONITORING, ED: Glucose-Capillary: 108 mg/dL — ABNORMAL HIGH (ref 70–99)

## 2024-02-21 LAB — APTT
aPTT: 35 s (ref 24–36)
aPTT: 104 s — ABNORMAL HIGH (ref 24–36)

## 2024-02-21 LAB — HEPARIN LEVEL (UNFRACTIONATED)
Heparin Unfractionated: 0.14 [IU]/mL — ABNORMAL LOW (ref 0.30–0.70)
Heparin Unfractionated: 0.5 [IU]/mL (ref 0.30–0.70)

## 2024-02-21 LAB — COMPREHENSIVE METABOLIC PANEL WITH GFR
ALT: 338 U/L — ABNORMAL HIGH (ref 0–44)
AST: 234 U/L — ABNORMAL HIGH (ref 15–41)
Albumin: 2.5 g/dL — ABNORMAL LOW (ref 3.5–5.0)
Alkaline Phosphatase: 254 U/L — ABNORMAL HIGH (ref 38–126)
Anion gap: 8 (ref 5–15)
BUN: 17 mg/dL (ref 8–23)
CO2: 20 mmol/L — ABNORMAL LOW (ref 22–32)
Calcium: 8 mg/dL — ABNORMAL LOW (ref 8.9–10.3)
Chloride: 114 mmol/L — ABNORMAL HIGH (ref 98–111)
Creatinine, Ser: 0.81 mg/dL (ref 0.61–1.24)
GFR, Estimated: >60 mL/min
Glucose, Bld: 104 mg/dL — ABNORMAL HIGH (ref 70–99)
Potassium: 3.4 mmol/L — ABNORMAL LOW (ref 3.5–5.1)
Sodium: 142 mmol/L (ref 135–145)
Total Bilirubin: 3.6 mg/dL — ABNORMAL HIGH (ref 0.0–1.2)
Total Protein: 6.1 g/dL — ABNORMAL LOW (ref 6.5–8.1)

## 2024-02-21 LAB — CBC
HCT: 35.5 % — ABNORMAL LOW (ref 39.0–52.0)
Hemoglobin: 11.8 g/dL — ABNORMAL LOW (ref 13.0–17.0)
MCH: 26.7 pg (ref 26.0–34.0)
MCHC: 33.2 g/dL (ref 30.0–36.0)
MCV: 80.3 fL (ref 80.0–100.0)
Platelets: 343 10*3/uL (ref 150–400)
RBC: 4.42 MIL/uL (ref 4.22–5.81)
RDW: 16.1 % — ABNORMAL HIGH (ref 11.5–15.5)
WBC: 12 10*3/uL — ABNORMAL HIGH (ref 4.0–10.5)
nRBC: 0 % (ref 0.0–0.2)

## 2024-02-21 LAB — GLUCOSE, CAPILLARY
Glucose-Capillary: 114 mg/dL — ABNORMAL HIGH (ref 70–99)
Glucose-Capillary: 123 mg/dL — ABNORMAL HIGH (ref 70–99)

## 2024-02-21 LAB — LACTIC ACID, PLASMA
Lactic Acid, Venous: 1.3 mmol/L (ref 0.5–1.9)
Lactic Acid, Venous: 0.9 mmol/L (ref 0.5–1.9)

## 2024-02-21 LAB — PROTIME-INR
INR: 1.4 — ABNORMAL HIGH (ref 0.8–1.2)
Prothrombin Time: 16.9 s — ABNORMAL HIGH (ref 11.4–15.2)

## 2024-02-21 LAB — HEMOGLOBIN A1C
Hgb A1c MFr Bld: 7.4 % — ABNORMAL HIGH (ref 4.8–5.6)
Mean Plasma Glucose: 165.68 mg/dL

## 2024-02-21 MED ORDER — EMPAGLIFLOZIN 25 MG PO TABS: 25.0000 mg | ORAL_TABLET | Freq: Every day | ORAL | Status: DC

## 2024-02-21 MED ORDER — HEPARIN BOLUS VIA INFUSION
5500.0000 [IU] | Freq: Once | INTRAVENOUS | Status: AC
  Administered 2024-02-21: 5500 [IU] via INTRAVENOUS
  Filled 2024-02-21: qty 5500

## 2024-02-21 MED ORDER — PANTOPRAZOLE SODIUM 40 MG PO TBEC
40.0000 mg | DELAYED_RELEASE_TABLET | Freq: Every day | ORAL | Status: DC
  Administered 2024-02-21 – 2024-02-25 (×5): 40 mg via ORAL
  Filled 2024-02-21 (×5): qty 1

## 2024-02-21 MED ORDER — ACETAMINOPHEN 650 MG RE SUPP: 650.0000 mg | Freq: Four times a day (QID) | RECTAL | Status: DC | PRN

## 2024-02-21 MED ORDER — FENTANYL CITRATE PF 50 MCG/ML IJ SOSY
50.0000 ug | PREFILLED_SYRINGE | Freq: Once | INTRAMUSCULAR | Status: AC
  Administered 2024-02-21: 50 ug via INTRAVENOUS
  Filled 2024-02-21: qty 1

## 2024-02-21 MED ORDER — ONDANSETRON HCL 4 MG PO TABS: 4.0000 mg | ORAL_TABLET | Freq: Four times a day (QID) | ORAL | Status: DC | PRN

## 2024-02-21 MED ORDER — OMEPRAZOLE MAGNESIUM 20 MG PO TBEC: 20.0000 mg | DELAYED_RELEASE_TABLET | Freq: Every day | ORAL | Status: DC

## 2024-02-21 MED ORDER — LACTATED RINGERS IV SOLN
150.0000 mL/h | INTRAVENOUS | Status: DC
  Administered 2024-02-21: 150 mL/h via INTRAVENOUS

## 2024-02-21 MED ORDER — LOSARTAN POTASSIUM 50 MG PO TABS
50.0000 mg | ORAL_TABLET | Freq: Every day | ORAL | Status: DC
  Administered 2024-02-21 – 2024-02-25 (×5): 50 mg via ORAL
  Filled 2024-02-21 (×5): qty 1

## 2024-02-21 MED ORDER — ONDANSETRON HCL 4 MG/2ML IJ SOLN: 4.0000 mg | Freq: Four times a day (QID) | INTRAMUSCULAR | Status: DC | PRN

## 2024-02-21 MED ORDER — INSULIN ASPART 100 UNIT/ML IJ SOLN
0.0000 [IU] | Freq: Three times a day (TID) | INTRAMUSCULAR | Status: DC
  Administered 2024-02-22: 3 [IU] via SUBCUTANEOUS
  Administered 2024-02-23 – 2024-02-24 (×5): 2 [IU] via SUBCUTANEOUS
  Administered 2024-02-24: 3 [IU] via SUBCUTANEOUS
  Administered 2024-02-25: 2 [IU] via SUBCUTANEOUS
  Filled 2024-02-21 (×6): qty 1

## 2024-02-21 MED ORDER — MAGNESIUM HYDROXIDE 400 MG/5ML PO SUSP: 30.0000 mL | Freq: Every day | ORAL | Status: DC | PRN

## 2024-02-21 MED ORDER — ATORVASTATIN CALCIUM 20 MG PO TABS: 40.0000 mg | ORAL_TABLET | Freq: Every day | ORAL | Status: DC

## 2024-02-21 MED ORDER — SODIUM CHLORIDE 0.9 % IV SOLN
2.0000 g | Freq: Three times a day (TID) | INTRAVENOUS | Status: DC
  Administered 2024-02-21 – 2024-02-25 (×13): 2 g via INTRAVENOUS
  Filled 2024-02-21 (×15): qty 12.5

## 2024-02-21 MED ORDER — SODIUM CHLORIDE 0.9 % IV BOLUS (SEPSIS)
1000.0000 mL | Freq: Once | INTRAVENOUS | Status: AC
  Administered 2024-02-21: 1000 mL via INTRAVENOUS

## 2024-02-21 MED ORDER — METRONIDAZOLE 500 MG/100ML IV SOLN
500.0000 mg | Freq: Two times a day (BID) | INTRAVENOUS | Status: DC
  Administered 2024-02-21 – 2024-02-25 (×9): 500 mg via INTRAVENOUS
  Filled 2024-02-21 (×10): qty 100

## 2024-02-21 MED ORDER — METRONIDAZOLE 500 MG/100ML IV SOLN: 500.0000 mg | Freq: Once | INTRAVENOUS | Status: DC

## 2024-02-21 MED ORDER — TRAZODONE HCL 50 MG PO TABS
25.0000 mg | ORAL_TABLET | Freq: Every evening | ORAL | Status: DC | PRN
  Administered 2024-02-22: 25 mg via ORAL
  Filled 2024-02-21: qty 1

## 2024-02-21 MED ORDER — HEPARIN (PORCINE) 25000 UT/250ML-% IV SOLN
1400.0000 [IU]/h | INTRAVENOUS | Status: AC
  Administered 2024-02-21: 1400 [IU]/h via INTRAVENOUS
  Filled 2024-02-21: qty 250

## 2024-02-21 MED ORDER — IOHEXOL 300 MG/ML  SOLN
100.0000 mL | Freq: Once | INTRAMUSCULAR | Status: AC | PRN
  Administered 2024-02-21: 100 mL via INTRAVENOUS

## 2024-02-21 MED ORDER — SODIUM CHLORIDE 0.9 % IV SOLN: INTRAVENOUS | Status: DC

## 2024-02-21 MED ORDER — IPRATROPIUM-ALBUTEROL 0.5-2.5 (3) MG/3ML IN SOLN: 3.0000 mL | RESPIRATORY_TRACT | Status: DC | PRN

## 2024-02-21 MED ORDER — POTASSIUM CHLORIDE CRYS ER 20 MEQ PO TBCR
40.0000 meq | EXTENDED_RELEASE_TABLET | Freq: Once | ORAL | Status: AC
  Administered 2024-02-21: 40 meq via ORAL
  Filled 2024-02-21: qty 2

## 2024-02-21 MED ORDER — SODIUM CHLORIDE 0.9 % IV SOLN: 2.0000 g | Freq: Once | INTRAVENOUS | Status: DC

## 2024-02-21 MED ORDER — ONDANSETRON HCL 4 MG/2ML IJ SOLN
4.0000 mg | Freq: Once | INTRAMUSCULAR | Status: AC
  Administered 2024-02-21: 4 mg via INTRAVENOUS
  Filled 2024-02-21: qty 2

## 2024-02-21 MED ORDER — ACETAMINOPHEN 325 MG PO TABS
650.0000 mg | ORAL_TABLET | Freq: Four times a day (QID) | ORAL | Status: DC | PRN
  Administered 2024-02-22: 650 mg via ORAL
  Filled 2024-02-21: qty 2

## 2024-02-21 MED ORDER — SODIUM CHLORIDE 0.9 % IV SOLN
2.0000 g | Freq: Once | INTRAVENOUS | Status: AC
  Administered 2024-02-21: 2 g via INTRAVENOUS
  Filled 2024-02-21: qty 20

## 2024-02-21 MED ORDER — HYDROCHLOROTHIAZIDE 25 MG PO TABS: 25.0000 mg | ORAL_TABLET | Freq: Every day | ORAL | Status: DC

## 2024-02-21 MED ORDER — MORPHINE SULFATE (PF) 2 MG/ML IV SOLN
2.0000 mg | INTRAVENOUS | Status: DC | PRN
  Administered 2024-02-22 – 2024-02-23 (×2): 2 mg via INTRAVENOUS
  Filled 2024-02-21 (×2): qty 1

## 2024-02-21 MED ORDER — NICOTINE 21 MG/24HR TD PT24
21.0000 mg | MEDICATED_PATCH | Freq: Every day | TRANSDERMAL | Status: DC
  Filled 2024-02-21 (×2): qty 1

## 2024-02-21 MED ORDER — GLIPIZIDE 10 MG PO TABS: 10.0000 mg | ORAL_TABLET | Freq: Every day | ORAL | Status: DC

## 2024-02-21 MED ORDER — ENOXAPARIN SODIUM 40 MG/0.4ML IJ SOSY: 40.0000 mg | PREFILLED_SYRINGE | INTRAMUSCULAR | Status: DC

## 2024-02-21 NOTE — Consult Note (Addendum)
 [facility] SURGICAL CONSULTATION NOTE (initial) - cpt: 99254   HISTORY OF PRESENT ILLNESS (HPI):  73 y.o. male presented to [facility] ED today for evaluation of abdominal pain. Patient reports a substernal pain more than a month ago, without diagnosis made definitively.  His pain recurred significantly worse yesterday, without vomiting, diarrhea, fevers or chills.  Surgery is consulted by hospitalist physician Dr. Achilles Holes in this context for evaluation and management of acute cholecystitis with calculus.  ED workup includes low-grade temperature, leukocytosis, elevated transaminases.  Right upper quadrant quadrant ultrasound noted, and appreciate follow-up CT scan examination by radiology.    PAST MEDICAL HISTORY (PMH):  Past Medical History:  Diagnosis Date   Diabetes mellitus without complication (HCC)    DVT (deep venous thrombosis) (HCC)    HTN (hypertension)    PE (pulmonary thromboembolism) (HCC)    Tobacco abuse      PAST SURGICAL HISTORY (PSH):  Past Surgical History:  Procedure Laterality Date   left shoulder surgery Left      MEDICATIONS:  Prior to Admission medications   Medication Sig Start Date End Date Taking? Authorizing Provider  albuterol  (PROVENTIL ) (2.5 MG/3ML) 0.083% nebulizer solution Take 3 mLs (2.5 mg total) by nebulization every 4 (four) hours as needed for wheezing or shortness of breath. 10/12/21   Sung, Jade J, MD  albuterol  (VENTOLIN  HFA) [phone] Base) MCG/ACT inhaler Inhale 2 puffs into the lungs every 4 (four) hours as needed for wheezing or shortness of breath. 10/12/21   Sung, Jade J, MD  apixaban  (ELIQUIS ) 5 MG TABS tablet Take 1 tablet (5 mg total) by mouth 2 (two) times daily. 04/28/21 05/28/21  Leonora Ramus, MD  atorvastatin  (LIPITOR) 40 MG tablet Take 1 tablet (40 mg total) by mouth daily. 04/29/21 09/03/23  Leonora Ramus, MD  empagliflozin  (JARDIANCE ) 25 MG TABS tablet Take by mouth. 08/15/23   [provider]  glipiZIDE   (GLUCOTROL ) 10 MG tablet Take by mouth. 08/15/23   [provider]  hydrochlorothiazide  (HYDRODIURIL ) 25 MG tablet Take 25 mg by mouth daily. 06/15/20   [provider]  losartan  (COZAAR ) 50 MG tablet Take by mouth. 08/15/23   [provider]  Nebulizers (COMPRESSOR/NEBULIZER) MISC 1 Units by Does not apply route every 4 (four) hours as needed. 10/12/21   Sung, Jade J, MD  nicotine  (NICODERM CQ  - DOSED IN MG/24 HOURS) 21 mg/24hr patch Place 1 patch (21 mg total) onto the skin daily. 04/15/21   Althia Atlas, MD  omeprazole  (PRILOSEC  OTC) 20 MG tablet Take 1 tablet (20 mg total) by mouth daily. 03/06/23 03/05/24  Bryson Carbine, MD     ALLERGIES:  No Known Allergies   SOCIAL HISTORY:  Social History   Socioeconomic History   Marital status: Single    Spouse name: Not on file   Number of children: Not on file   Years of education: Not on file   Highest education level: Not on file  Occupational History   Not on file  Tobacco Use   Smoking status: Former    Types: Cigarettes   Smokeless tobacco: Never  Vaping Use   Vaping status: Never Used  Substance and Sexual Activity   Alcohol use: Not Currently   Drug use: Never   Sexual activity: Not on file  Other Topics Concern   Not on file  Social History Narrative   Not on file   Social Drivers of Health   Financial Resource Strain: Not on file  Food Insecurity: Not on  file  Transportation Needs: Not on file  Physical Activity: Not on file  Stress: Not on file  Social Connections: Not on file  Intimate Partner Violence: Not on file     FAMILY HISTORY:  Family History  Problem Relation Age of Onset   Stroke Brother       REVIEW OF SYSTEMS:  Review of Systems  All other systems reviewed and are negative.   VITAL SIGNS:  Temp:  [98.4 F (36.9 C)-99 F (37.2 C)] 98.6 F (37 C) (05/01 0639) Pulse Rate:  [71-104] 71 (05/01 0900) Resp:  [15-33] 21 (05/01 0900) BP: (107-140)/(71-91) 140/91  (05/01 0900) SpO2:  [93 %-99 %] 99 % (05/01 0900) Weight:  [83.9 kg] 83.9 kg (04/30 1811)     Height: 6' (182.9 cm) Weight: 83.9 kg BMI (Calculated): 25.08   INTAKE/OUTPUT:  04/30 0701 - 05/01 0700 In: 100 [IV Piggyback:100] Out: -   PHYSICAL EXAM:  Physical Exam Blood pressure (!) 140/91, pulse 71, temperature 98.6 F (37 C), temperature source Oral, resp. rate (!) 21, height 6' (1.829 m), weight 83.9 kg, SpO2 99%. Last Weight  Most recent update: 02/20/2024  6:11 PM    Weight  83.9 kg (185 lb)             CONSTITUTIONAL: Well developed, and nourished, appropriately responsive and aware without distress.   EYES: Sclera non-icteric.   EARS, NOSE, MOUTH AND THROAT:  The oropharynx is clear. Oral mucosa is pink and moist.  Dentition: In terrible condition.   Hearing is intact to voice.  NECK: Trachea is midline, and there is no jugular venous distension.  LYMPH NODES:  Lymph nodes in the neck are not enlarged. RESPIRATORY:  Lungs are clear, and breath sounds are equal bilaterally.   Normal respiratory effort without pathologic use of accessory muscles. CARDIOVASCULAR: Heart is regular in rate and rhythm.   Well perfused.  GI: The abdomen is tender in the epigastrium and right upper quadrant.  Otherwise soft, nontender, and nondistended. There were no palpable masses. I did not appreciate hepatosplenomegaly. There were normal bowel sounds. MUSCULOSKELETAL:  Symmetrical muscle tone appreciated in all four extremities. Warm without edema.  SKIN: Skin turgor is normal. No pathologic skin lesions appreciated.  NEUROLOGIC:  Motor and sensation appear grossly normal.  Cranial nerves are grossly without defect. PSYCH:  Alert and oriented to person, place and time. Affect is appropriate for situation.  Data Reviewed I have personally reviewed what is currently available of the patient's imaging, recent labs and medical records.    Labs:     Latest Ref Rng & Units 02/21/2024    7:57 AM  02/20/2024    6:12 PM 02/02/2024    4:48 PM  CBC  WBC 4.0 - 10.5 K/uL 12.0  16.8  7.6   Hemoglobin 13.0 - 17.0 g/dL 62.9  52.8  41.3   Hematocrit 39.0 - 52.0 % 35.5  43.5  49.4   Platelets 150 - 400 K/uL 343  423  220       Latest Ref Rng & Units 02/21/2024    7:57 AM 02/20/2024    6:12 PM 02/02/2024    5:29 PM  CMP  Glucose 70 - 99 mg/dL 244  010  272   BUN 8 - 23 mg/dL 17  15  11    Creatinine 0.61 - 1.24 mg/dL 5.36  6.44  0.34   Sodium 135 - 145 mmol/L 142  140  140   Potassium 3.5 - 5.1 mmol/L  3.4  3.2  3.4   Chloride 98 - 111 mmol/L 114  110  108   CO2 22 - 32 mmol/L 20  22  23    Calcium  8.9 - 10.3 mg/dL 8.0  8.5  9.2   Total Protein 6.5 - 8.1 g/dL 6.1  7.3  7.7   Total Bilirubin 0.0 - 1.2 mg/dL 3.6  1.2  0.8   Alkaline Phos 38 - 126 U/L 254  262  84   AST 15 - 41 U/L 234  214  22   ALT 0 - 44 U/L 338  134  29      Imaging studies:   Last 24 hrs: CT ABDOMEN PELVIS W CONTRAST Result Date: 02/21/2024 CLINICAL DATA:  Gallstones with gallbladder wall thickening on ultrasound. Clinical concern for acute cholecystitis. EXAM: CT ABDOMEN AND PELVIS WITH CONTRAST TECHNIQUE: Multidetector CT imaging of the abdomen and pelvis was performed using the standard protocol following bolus administration of intravenous contrast. RADIATION DOSE REDUCTION: This exam was performed according to the departmental dose-optimization program which includes automated exposure control, adjustment of the mA and/or kV according to patient size and/or use of iterative reconstruction technique. CONTRAST:  OMNIPAQUE  IOHEXOL  300 MG/ML  SOLN COMPARISON:  Ultrasound exam earlier same day.  CT scan 02/02/2024. FINDINGS: Lower chest: Right lower lobe collapse/consolidation with some probable minimal atelectasis in the left base. 4 mm left upper lobe nodule on 12/04. 5 mm left lower lobe nodule on 17/4. Both of these appear new in the interval. Several additional scattered tiny nodules are seen in the right middle and  left lower lobe. No pleural effusion. Hepatobiliary: Heterogeneous liver perfusion with apparent hyperemia around the gallbladder fossa. Gallbladder wall is thickened irregular. Coronal imaging suggests the presence of a mucosal defect (36/5) with dissection into the perinephric space of the gallbladder fossa (coronal 40/5). Possible mural defect towards the fundus cranially on 30/5 with potential tiny gas bubble on the same image also seen on 34/6 of the sagittal series. High density material in the lumen of the gallbladder may be sludge or blood. Calcified gallstone seen towards the neck of the gallbladder on coronal 49/5. No intrahepatic or extrahepatic biliary dilation. Pancreas: No focal mass lesion. No dilatation of the main duct. No intraparenchymal cyst. No peripancreatic edema. Spleen: No splenomegaly. No suspicious focal mass lesion. Adrenals/Urinary Tract: No adrenal nodule or mass. 3.7 cm simple cyst noted upper pole right kidney. Tiny well-defined homogeneous low-density lesions in both kidneys are too small to characterize but are statistically most likely benign and probably cysts. No followup imaging is recommended. No evidence for hydroureter. The urinary bladder appears normal for the degree of distention. Stomach/Bowel: Stomach is unremarkable. No gastric wall thickening. No evidence of outlet obstruction. Mild wall thickening proximal descending duodenum with adjacent Peri duodenal edema/fluid tracking down from the region of the gallbladder fossa. No small bowel wall thickening. No small bowel dilatation. The terminal ileum is normal. The appendix is normal. No gross colonic mass. No colonic wall thickening. Diverticular changes are noted in the left colon without evidence of diverticulitis. Vascular/Lymphatic: There is mild atherosclerotic calcification of the abdominal aorta without aneurysm. There is no gastrohepatic or hepatoduodenal ligament lymphadenopathy. No retroperitoneal or mesenteric  lymphadenopathy. No pelvic sidewall lymphadenopathy. Reproductive: Prostate gland is mildly enlarged. Other: No intraperitoneal free fluid. Musculoskeletal: No worrisome lytic or sclerotic osseous abnormality. IMPRESSION: 1. Gallbladder wall is thickened and irregular with high density material in the lumen of the gallbladder which may be sludge  or blood. Calcified gallstone towards the neck of the gallbladder. Coronal imaging suggests the presence of a mucosal defect with dissection into the perinephric space of the gallbladder fossa, findings concerning for mural perforation and evolving pericholecystic abscess. Possible second mural defect towards the fundus cranially with potential tiny gas bubble. Imaging features are compatible with acute cholecystitis with gallbladder perforation. 2. Heterogeneous liver perfusion with apparent hyperemia around the gallbladder fossa. This is likely reactive related to the gallbladder process. 3. Mild wall thickening proximal descending duodenum with adjacent periduodenal edema/fluid tracking down from the region of the gallbladder fossa. These changes are felt to be secondary. 4. Right lower lobe collapse/consolidation with some probable minimal atelectasis in the left base. 5. Multiple pulmonary nodules. Most severe: 5 mm left solid pulmonary nodule. No follow-up needed if patient is low-risk (and has no known or suspected primary neoplasm). Non-contrast chest CT can be considered in 12 months if patient is high-risk. This recommendation follows the consensus statement: Guidelines for Management of Incidental Pulmonary Nodules Detected on CT Images: From the Fleischner Society 2017; Radiology 2017; 284:228-243. 6.  Aortic Atherosclerosis (ICD10-I70.0). Electronically Signed   By: Donnal Fusi M.D.   On: 02/21/2024 10:05   US  ABDOMEN LIMITED RUQ (LIVER/GB) Result Date: 02/21/2024 EXAM: Right Upper Quadrant Abdominal Ultrasound 02/21/2024 01:11:00 AM TECHNIQUE: Real-time  ultrasonography of the right upper quadrant of the abdomen was performed. COMPARISON: CT abdomen/pelvis dated 02/02/2024. CLINICAL HISTORY: Upper abdominal pain. FINDINGS: LIVER: Hyperechoic hepatic parenchyma, suggesting hepatic steatosis. No evidence of intrahepatic biliary ductal dilatation. No evidence of mass. BILIARY SYSTEM: Mild irregular gallbladder wall thickening and positive sonographic Murphy's sign. Abnormal soft tissue in the gallbladder lumen, not clearly evident on prior CT, possibly reflecting tumefactive sludge. Known tiny layering gallstones, better visualized on CT. Common bile duct is within normal limits measuring . RIGHT KIDNEY: The right kidney is grossly unremarkable without evidence of hydronephrosis. PANCREAS: Visualized portions of the pancreas are unremarkable. OTHER: No evidence of right upper quadrant ascites. IMPRESSION: 1. Cholelithiasis with suspected tumefactive sludge. Associated irregular gallbladder wall thickening and positive sonographic Murphy's sign, equivocal but raising concern for acute cholecystitis. 2. Hepatic steatosis. Electronically signed by: Zadie Herter MD 02/21/2024 01:29 AM EDT RP Workstation: [id number]   DG Chest Portable 1 View Result Date: 02/21/2024 CLINICAL DATA:  Sepsis EXAM: PORTABLE CHEST 1 VIEW COMPARISON:  11/17/2022 FINDINGS: Mild interval decrease in pulmonary insufflation. Ovoid density at the right lung base appears new from prior CT abdomen pelvis of 02/02/2024 and likely represents an area discoid atelectasis. Lungs are otherwise clear. No pneumothorax or pleural effusion. Cardiac size within normal limits. No acute bone abnormality. IMPRESSION: No active disease. Electronically Signed   By: Worthy Heads M.D.   On: 02/21/2024 01:25     Assessment/Plan:  73 y.o. 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 Julietta Ogren regarding proceeding with minimally invasive approach to stabilize this infectious and inflammatory process.  - I believe putting off definitive cholecystectomy, is in this gentlemen's best  interest.  - Concur with broad-spectrum antibiotics as ordered and initiated.  Will continue to follow with you.  All of the above findings and recommendations were discussed with the patient, and all of patient's questions were answered to their expressed satisfaction. Face-to-face time spent with the patient and accompanying care providers(if present) was 60 minutes, spent counseling, educating, and coordinating care of the patient.   Thank you for the opportunity to participate in this patient's care.   -- Flynn Hylan, M.D., FACS 02/21/2024, 11:10 AM

## 2024-02-21 NOTE — H&P (Addendum)
 History and Physical    Douglas Smith GEX:[id number] DOB: [birth year] DOA: 02/21/2024  PCP: [facility] (Confirm with patient/family/NH records and if not entered, this has to be entered at [facility] point of entry) Patient coming from: Home  I have personally briefly reviewed patient's old medical records in [facility]  Chief Complaint: Belly hurts  HPI: Douglas Smith is a 73 y.o. male with medical history significant of PE/DVT on Eliquis , HTN, COPD, IIDM, presented with recurrent epigastric pain.  Patient started to have intermittent epigastric pain about 3 to 4 weeks ago, came to ED 1 time 2 weeks ago, CT scan did not show significant findings other than cholelithiasis, and patient was given GI cocktail and symptoms improved and patient was reassured and sent home.  Last night, patient started to have same symptoms, with cramping-like epigastric pain, 10/10, associated with nausea but no vomiting no diarrhea no fever or chills.  Denies any cough no chest pains.  ED Course: Temperature 99.0, not tachycardia nonhypotensive not hypoxic.  Lab work showed WBC 16.8 K3.2 AST 214 ALT 134, bilirubin 1.2 lipase within normal limits.  RUQ ultrasound showed cholelithiasis with suspected tumefactive sludge associated with irregular gallbladder wall thickening and positive sonographic Murphy sign concerning about acute cholecystitis.  Patient was started on cefepime  and Flagyl   Review of Systems: As per HPI otherwise 14 point review of systems negative.    Past Medical History:  Diagnosis Date   Diabetes mellitus without complication (HCC)    DVT (deep venous thrombosis) (HCC)    HTN (hypertension)    PE (pulmonary thromboembolism) (HCC)    Tobacco abuse     Past Surgical History:  Procedure Laterality Date   left shoulder surgery Left      reports that he has quit smoking. His smoking use included cigarettes. He has never used smokeless tobacco. He reports that he does not  currently use alcohol. He reports that he does not use drugs.  No Known Allergies  Family History  Problem Relation Age of Onset   Stroke Brother      Prior to Admission medications   Medication Sig Start Date End Date Taking? Authorizing Provider  albuterol  (PROVENTIL ) (2.5 MG/3ML) 0.083% nebulizer solution Take 3 mLs (2.5 mg total) by nebulization every 4 (four) hours as needed for wheezing or shortness of breath. 10/12/21   Sung, Jade J, MD  albuterol  (VENTOLIN  HFA) [phone] Base) MCG/ACT inhaler Inhale 2 puffs into the lungs every 4 (four) hours as needed for wheezing or shortness of breath. 10/12/21   Sung, Jade J, MD  apixaban  (ELIQUIS ) 5 MG TABS tablet Take 1 tablet (5 mg total) by mouth 2 (two) times daily. 04/28/21 05/28/21  Leonora Ramus, MD  atorvastatin  (LIPITOR) 40 MG tablet Take 1 tablet (40 mg total) by mouth daily. 04/29/21 09/03/23  Leonora Ramus, MD  empagliflozin  (JARDIANCE ) 25 MG TABS tablet Take by mouth. 08/15/23   [provider]  glipiZIDE  (GLUCOTROL ) 10 MG tablet Take by mouth. 08/15/23   [provider]  hydrochlorothiazide  (HYDRODIURIL ) 25 MG tablet Take 25 mg by mouth daily. 06/15/20   [provider]  losartan  (COZAAR ) 50 MG tablet Take by mouth. 08/15/23   [provider]  Nebulizers (COMPRESSOR/NEBULIZER) MISC 1 Units by Does not apply route every 4 (four) hours as needed. 10/12/21   Sung, Jade J, MD  nicotine  (NICODERM CQ  - DOSED IN MG/24 HOURS) 21 mg/24hr patch Place 1 patch (21 mg total) onto the skin daily. 04/15/21  Althia Atlas, MD  omeprazole  (PRILOSEC  OTC) 20 MG tablet Take 1 tablet (20 mg total) by mouth daily. 03/06/23 03/05/24  Bryson Carbine, MD    Physical Exam: Vitals:   02/21/24 0530 02/21/24 0600 02/21/24 0639 02/21/24 0700  BP: 110/73 134/80  134/86  Pulse: 74 76  78  Resp: 16 18  (!) 21  Temp:   98.6 F (37 C)   TempSrc:   Oral   SpO2: 96% 96%  98%  Weight:      Height:        Constitutional: NAD,  calm, comfortable Vitals:   02/21/24 0530 02/21/24 0600 02/21/24 0639 02/21/24 0700  BP: 110/73 134/80  134/86  Pulse: 74 76  78  Resp: 16 18  (!) 21  Temp:   98.6 F (37 C)   TempSrc:   Oral   SpO2: 96% 96%  98%  Weight:      Height:       Eyes: PERRL, lids and conjunctivae normal ENMT: Mucous membranes are moist. Posterior pharynx clear of any exudate or lesions.Normal dentition.  Neck: normal, supple, no masses, no thyromegaly Respiratory: clear to auscultation bilaterally, no wheezing, no crackles. Normal respiratory effort. No accessory muscle use.  Cardiovascular: Regular rate and rhythm, no murmurs / rubs / gallops. No extremity edema. 2+ pedal pulses. No carotid bruits.  Abdomen: RUQ tenderness no rebound no guarding, no masses palpated. No hepatosplenomegaly. Bowel sounds positive.  Musculoskeletal: no clubbing / cyanosis. No joint deformity upper and lower extremities. Good ROM, no contractures. Normal muscle tone.  Skin: no rashes, lesions, ulcers. No induration Neurologic: CN 2-12 grossly intact. Sensation intact, DTR normal. Strength 5/5 in all 4.  Psychiatric: Normal judgment and insight. Alert and oriented x 3. Normal mood.     Labs on Admission: I have personally reviewed following labs and imaging studies  CBC: Recent Labs  Lab 02/20/24 1812 02/21/24 0757  WBC 16.8* 12.0*  HGB 13.8 11.8*  HCT 43.5 35.5*  MCV 83.0 80.3  PLT 423* 343   Basic Metabolic Panel: Recent Labs  Lab 02/20/24 1812 02/21/24 0757  NA 140 142  K 3.2* 3.4*  CL 110 114*  CO2 22 20*  GLUCOSE 118* 104*  BUN 15 17  CREATININE 0.85 0.81  CALCIUM  8.5* 8.0*   GFR: Estimated Creatinine Clearance: 89.1 mL/min (by C-G formula based on SCr of 0.81 mg/dL). Liver Function Tests: Recent Labs  Lab 02/20/24 1812 02/21/24 0757  AST 214* 234*  ALT 134* 338*  ALKPHOS 262* 254*  BILITOT 1.2 3.6*  PROT 7.3 6.1*  ALBUMIN 3.0* 2.5*   Recent Labs  Lab 02/20/24 1812  LIPASE 44   No  results for input(s): "AMMONIA" in the last 168 hours. Coagulation Profile: Recent Labs  Lab 02/21/24 0757  INR 1.4*   Cardiac Enzymes: No results for input(s): "CKTOTAL", "CKMB", "CKMBINDEX", "TROPONINI" in the last 168 hours. BNP (last 3 results) No results for input(s): "PROBNP" in the last 8760 hours. HbA1C: No results for input(s): "HGBA1C" in the last 72 hours. CBG: No results for input(s): "GLUCAP" in the last 168 hours. Lipid Profile: No results for input(s): "CHOL", "HDL", "LDLCALC", "TRIG", "CHOLHDL", "LDLDIRECT" in the last 72 hours. Thyroid Function Tests: No results for input(s): "TSH", "T4TOTAL", "FREET4", "T3FREE", "THYROIDAB" in the last 72 hours. Anemia Panel: No results for input(s): "VITAMINB12", "FOLATE", "FERRITIN", "TIBC", "IRON", "RETICCTPCT" in the last 72 hours. Urine analysis:    Component Value Date/Time   COLORURINE AMBER (A) 02/20/2024 2223  APPEARANCEUR CLEAR (A) 02/20/2024 2223   LABSPEC 1.028 02/20/2024 2223   PHURINE 5.0 02/20/2024 2223   GLUCOSEU >=500 (A) 02/20/2024 2223   HGBUR SMALL (A) 02/20/2024 2223   BILIRUBINUR NEGATIVE 02/20/2024 2223   KETONESUR NEGATIVE 02/20/2024 2223   PROTEINUR NEGATIVE 02/20/2024 2223   NITRITE POSITIVE (A) 02/20/2024 2223   LEUKOCYTESUR NEGATIVE 02/20/2024 2223    Radiological Exams on Admission: US  ABDOMEN LIMITED RUQ (LIVER/GB) Result Date: 02/21/2024 EXAM: Right Upper Quadrant Abdominal Ultrasound 02/21/2024 01:11:00 AM TECHNIQUE: Real-time ultrasonography of the right upper quadrant of the abdomen was performed. COMPARISON: CT abdomen/pelvis dated 02/02/2024. CLINICAL HISTORY: Upper abdominal pain. FINDINGS: LIVER: Hyperechoic hepatic parenchyma, suggesting hepatic steatosis. No evidence of intrahepatic biliary ductal dilatation. No evidence of mass. BILIARY SYSTEM: Mild irregular gallbladder wall thickening and positive sonographic Murphy's sign. Abnormal soft tissue in the gallbladder lumen, not clearly  evident on prior CT, possibly reflecting tumefactive sludge. Known tiny layering gallstones, better visualized on CT. Common bile duct is within normal limits measuring . RIGHT KIDNEY: The right kidney is grossly unremarkable without evidence of hydronephrosis. PANCREAS: Visualized portions of the pancreas are unremarkable. OTHER: No evidence of right upper quadrant ascites. IMPRESSION: 1. Cholelithiasis with suspected tumefactive sludge. Associated irregular gallbladder wall thickening and positive sonographic Murphy's sign, equivocal but raising concern for acute cholecystitis. 2. Hepatic steatosis. Electronically signed by: Zadie Herter MD 02/21/2024 01:29 AM EDT RP Workstation: [id number]   DG Chest Portable 1 View Result Date: 02/21/2024 CLINICAL DATA:  Sepsis EXAM: PORTABLE CHEST 1 VIEW COMPARISON:  11/17/2022 FINDINGS: Mild interval decrease in pulmonary insufflation. Ovoid density at the right lung base appears new from prior CT abdomen pelvis of 02/02/2024 and likely represents an area discoid atelectasis. Lungs are otherwise clear. No pneumothorax or pleural effusion. Cardiac size within normal limits. No acute bone abnormality. IMPRESSION: No active disease. Electronically Signed   By: Worthy Heads M.D.   On: 02/21/2024 01:25    EKG: Independently reviewed.  Sinus rhythm, no acute ST changes.  Assessment/Plan Principal Problem:   Acute cholecystitis  (please populate well all problems here in Problem List. (For example, if patient is on BP meds at home and you resume or decide to hold them, it is a problem that needs to be her. Same for CAD, COPD, HLD and so on)  Acute cholecystitis - Continue antibiotics cefepime  and Flagyl  - Discussed with surgeon bedside, given the history of PE on Eliquis , surgeon recommended conservative management, IV antibiotics and trending LFTs.  If no significant improvement, surgeon will likely consult IR for cholecystostomy, and likely postpone  cholecystectomy to outpatient.  Patient agreed with the plan.  Acute transaminitis - Probably related to acute cholecystitis, CT abdomen pelvis pending, lipase level normal and bilirubin level normal - Hold off statin  UTI -ABX as above  DVT and PE - Heparin  drip for now  HTN - Stable, continue HCTZ and losartan   HLD - Hold off statin  DVT prophylaxis: Heparin  drip Code Status: Full code Family Communication: None at bedside Disposition Plan: Patient is sick with acute cholecystitis requiring IV antibiotics and inpatient surgical consultation, expect more than 2 midnight hospital stay Consults called: General Surgery Admission status: Telemetry admission   [place] MD [facility] telemetry Pager [phone]  02/21/2024, 9:56 AM

## 2024-02-21 NOTE — Consult Note (Addendum)
 Pharmacy Consult Note - Anticoagulation  Pharmacy Consult for heparin  Indication:  hx VTE  PATIENT MEASUREMENTS: Height: 6' (182.9 cm) Weight: 83.9 kg (185 lb) IBW/kg (Calculated) : 77.6 HEPARIN  DW (KG): 83.9  VITAL SIGNS: Temp: 98 F (36.7 C) (05/01 1127) Temp Source: Oral (05/01 1127) BP: 134/80 (05/01 1100) Pulse Rate: 73 (05/01 1100)  Recent Labs    02/20/24 2223 02/21/24 0757  HGB  --  11.8*  HCT  --  35.5*  PLT  --  343  APTT  --  35  LABPROT  --  16.9*  INR  --  1.4*  HEPARINUNFRC  --  0.14*  CREATININE  --  0.81  TROPONINIHS 21*  --     Estimated Creatinine Clearance: 89.1 mL/min (by C-G formula based on SCr of 0.81 mg/dL).  PAST MEDICAL HISTORY: Past Medical History:  Diagnosis Date   Diabetes mellitus without complication (HCC)    DVT (deep venous thrombosis) (HCC)    HTN (hypertension)    PE (pulmonary thromboembolism) (HCC)    Tobacco abuse     ASSESSMENT: 73 y.o. male with PMH including multiple VTE in 2022 (takes Eliquis ), HTN, COPD, DM is presenting with epigastric pain concerning for acute cholecystitis. Patient is on chronic anticoagulation with Eliquis  for hx VTE in 2022 per chart review. Last dose per patient was 4/30 in the morning. Surgery to see patient for possible procedure related to his cholecystitis. Pharmacy has been consulted to initiate and manage heparin  intravenous infusion.  Pertinent medications: Eliquis  5mg  PO twice daily  Goal(s) of therapy: Heparin  level 0.3 - 0.7 units/mL aPTT 66 - 102 seconds Monitor platelets by anticoagulation protocol: Yes   Baseline anticoagulation labs: Recent Labs    02/20/24 1812 02/21/24 0757  APTT  --  35  INR  --  1.4*  HGB 13.8 11.8*  PLT 423* 343    Date Time aPTT/HL Rate/Comment      PLAN: Give full bolus of 5500 units; then start heparin  infusion at 1400 units/hour. Anti-Xa and aPTT already correlating, will titrate heparin  per anti-Xa level Check heparin  level in 8 hours,  then daily once at least two levels are consecutively therapeutic. Monitor CBC daily while on heparin  infusion.   Will M. Alva Jewels, PharmD Clinical Pharmacist 02/21/2024 11:46 AM

## 2024-02-21 NOTE — Consult Note (Cosign Needed Addendum)
 Chief Complaint: Acute cholecystitis - IR consulted for percutaneous cholecystostomy drain placement   Referring Provider(s): Flynn Hylan, MD   Supervising Physician: Elene Griffes  Patient Status: [facility] - ED  History of Present Illness: Douglas Smith is a 73 y.o. male with pmhx PE/DVT on Eliquis , HTN, COPD, DM2. Has been having recurrent epigastric abdominal pain for around 1 year. Was in the ED about 2 weeks ago and had unremarkable CT scan at that time and improved with GI cocktail. Now returned to the ED yesterday with recurrent of epigastric pain. Had RUQ US  showing irregular gallbladder wall thickening and positive sonographic Murphy's sign, concerning for acute cholecystitis. Repeat CT abd/pelvis with contrast today shows thickened and irregular gallbladder with high density material in the lumen, possible sludge or blood. There is also concern for early perforation and pericholecystic abscess formation.   Case was discussed with Dr. Julietta Ogren from IR and Dr. Ofilia Benton from general surgery. Plan is for percutaneous cholecystostomy drainage tomorrow, 5/2, given pt's last dose of eliquis  was yesterday morning.  Today WBC 12.0, lactic 0.9. Pt otherwise stable now and receiving IV abx. Continuing hold of eliquis  through tomorrow for planned procedure.   Patient is Full Code  Past Medical History:  Diagnosis Date   Diabetes mellitus without complication (HCC)    DVT (deep venous thrombosis) (HCC)    HTN (hypertension)    PE (pulmonary thromboembolism) (HCC)    Tobacco abuse     Past Surgical History:  Procedure Laterality Date   left shoulder surgery Left     Allergies: Patient has no known allergies.  Medications: Prior to Admission medications   Medication Sig Start Date End Date Taking? Authorizing Provider  apixaban  (ELIQUIS ) 5 MG TABS tablet Take 1 tablet (5 mg total) by mouth 2 (two) times daily. 04/28/21 02/21/24 Yes Masoud, Alisa App, MD  atorvastatin  (LIPITOR) 20 MG  tablet Take 20 mg by mouth daily. 01/10/24  Yes [provider]  empagliflozin  (JARDIANCE ) 25 MG TABS tablet Take 12.5 mg by mouth daily. 08/15/23  Yes [provider]  losartan  (COZAAR ) 50 MG tablet Take 25 mg by mouth daily. 08/15/23  Yes [provider]  nicotine  (NICODERM CQ  - DOSED IN MG/24 HOURS) 21 mg/24hr patch Place 1 patch (21 mg total) onto the skin daily. 04/15/21  Yes Althia Atlas, MD  albuterol  (PROVENTIL ) (2.5 MG/3ML) 0.083% nebulizer solution Take 3 mLs (2.5 mg total) by nebulization every 4 (four) hours as needed for wheezing or shortness of breath. 10/12/21  Yes Norlene Beavers, MD  albuterol  (VENTOLIN  HFA) 108 (90 Base) MCG/ACT inhaler Inhale 2 puffs into the lungs every 4 (four) hours as needed for wheezing or shortness of breath. 10/12/21  Yes Sung, Jade J, MD  Nebulizers (COMPRESSOR/NEBULIZER) MISC 1 Units by Does not apply route every 4 (four) hours as needed. 10/12/21   Norlene Beavers, MD     Family History  Problem Relation Age of Onset   Stroke Brother     Social History   Socioeconomic History   Marital status: Single    Spouse name: Not on file   Number of children: Not on file   Years of education: Not on file   Highest education level: Not on file  Occupational History   Not on file  Tobacco Use   Smoking status: Former    Types: Cigarettes   Smokeless tobacco: Never  Vaping Use   Vaping status: Never Used  Substance and Sexual Activity   Alcohol use:  Not Currently   Drug use: Never   Sexual activity: Not on file  Other Topics Concern   Not on file  Social History Narrative   Not on file   Social Drivers of Health   Financial Resource Strain: Not on file  Food Insecurity: Not on file  Transportation Needs: Not on file  Physical Activity: Not on file  Stress: Not on file  Social Connections: Not on file     Review of Systems: A 12 point ROS discussed and pertinent positives are indicated in the HPI above.  All other  systems are negative.  Review of Systems  Gastrointestinal:  Positive for abdominal pain.    Vital Signs: BP 134/80   Pulse 73   Temp 98 F (36.7 C) (Oral)   Resp (!) 21   Ht 6' (1.829 m)   Wt 185 lb (83.9 kg)   SpO2 99%   BMI 25.09 kg/m   Advance Care Plan: No documents on file  Physical Exam Vitals and nursing note reviewed.  HENT:     Mouth/Throat:     Mouth: Mucous membranes are moist.     Pharynx: Oropharynx is clear.  Cardiovascular:     Rate and Rhythm: Normal rate and regular rhythm.  Pulmonary:     Effort: Pulmonary effort is normal.     Breath sounds: Normal breath sounds.  Abdominal:     Palpations: Abdomen is soft.     Tenderness: There is abdominal tenderness in the right upper quadrant.  Musculoskeletal:     Right lower leg: No edema.     Left lower leg: No edema.  Skin:    General: Skin is warm and dry.  Neurological:     Mental Status: He is alert and oriented to person, place, and time. Mental status is at baseline.     Imaging: CT ABDOMEN PELVIS W CONTRAST Result Date: 02/21/2024 CLINICAL DATA:  Gallstones with gallbladder wall thickening on ultrasound. Clinical concern for acute cholecystitis. EXAM: CT ABDOMEN AND PELVIS WITH CONTRAST TECHNIQUE: Multidetector CT imaging of the abdomen and pelvis was performed using the standard protocol following bolus administration of intravenous contrast. RADIATION DOSE REDUCTION: This exam was performed according to the departmental dose-optimization program which includes automated exposure control, adjustment of the mA and/or kV according to patient size and/or use of iterative reconstruction technique. CONTRAST:  OMNIPAQUE  IOHEXOL  300 MG/ML  SOLN COMPARISON:  Ultrasound exam earlier same day.  CT scan 02/02/2024. FINDINGS: Lower chest: Right lower lobe collapse/consolidation with some probable minimal atelectasis in the left base. 4 mm left upper lobe nodule on 12/04. 5 mm left lower lobe nodule on 17/4.  Both of these appear new in the interval. Several additional scattered tiny nodules are seen in the right middle and left lower lobe. No pleural effusion. Hepatobiliary: Heterogeneous liver perfusion with apparent hyperemia around the gallbladder fossa. Gallbladder wall is thickened irregular. Coronal imaging suggests the presence of a mucosal defect (36/5) with dissection into the perinephric space of the gallbladder fossa (coronal 40/5). Possible mural defect towards the fundus cranially on 30/5 with potential tiny gas bubble on the same image also seen on 34/6 of the sagittal series. High density material in the lumen of the gallbladder may be sludge or blood. Calcified gallstone seen towards the neck of the gallbladder on coronal 49/5. No intrahepatic or extrahepatic biliary dilation. Pancreas: No focal mass lesion. No dilatation of the main duct. No intraparenchymal cyst. No peripancreatic edema. Spleen: No splenomegaly. No suspicious  focal mass lesion. Adrenals/Urinary Tract: No adrenal nodule or mass. 3.7 cm simple cyst noted upper pole right kidney. Tiny well-defined homogeneous low-density lesions in both kidneys are too small to characterize but are statistically most likely benign and probably cysts. No followup imaging is recommended. No evidence for hydroureter. The urinary bladder appears normal for the degree of distention. Stomach/Bowel: Stomach is unremarkable. No gastric wall thickening. No evidence of outlet obstruction. Mild wall thickening proximal descending duodenum with adjacent Peri duodenal edema/fluid tracking down from the region of the gallbladder fossa. No small bowel wall thickening. No small bowel dilatation. The terminal ileum is normal. The appendix is normal. No gross colonic mass. No colonic wall thickening. Diverticular changes are noted in the left colon without evidence of diverticulitis. Vascular/Lymphatic: There is mild atherosclerotic calcification of the abdominal aorta  without aneurysm. There is no gastrohepatic or hepatoduodenal ligament lymphadenopathy. No retroperitoneal or mesenteric lymphadenopathy. No pelvic sidewall lymphadenopathy. Reproductive: Prostate gland is mildly enlarged. Other: No intraperitoneal free fluid. Musculoskeletal: No worrisome lytic or sclerotic osseous abnormality. IMPRESSION: 1. Gallbladder wall is thickened and irregular with high density material in the lumen of the gallbladder which may be sludge or blood. Calcified gallstone towards the neck of the gallbladder. Coronal imaging suggests the presence of a mucosal defect with dissection into the perinephric space of the gallbladder fossa, findings concerning for mural perforation and evolving pericholecystic abscess. Possible second mural defect towards the fundus cranially with potential tiny gas bubble. Imaging features are compatible with acute cholecystitis with gallbladder perforation. 2. Heterogeneous liver perfusion with apparent hyperemia around the gallbladder fossa. This is likely reactive related to the gallbladder process. 3. Mild wall thickening proximal descending duodenum with adjacent periduodenal edema/fluid tracking down from the region of the gallbladder fossa. These changes are felt to be secondary. 4. Right lower lobe collapse/consolidation with some probable minimal atelectasis in the left base. 5. Multiple pulmonary nodules. Most severe: 5 mm left solid pulmonary nodule. No follow-up needed if patient is low-risk (and has no known or suspected primary neoplasm). Non-contrast chest CT can be considered in 12 months if patient is high-risk. This recommendation follows the consensus statement: Guidelines for Management of Incidental Pulmonary Nodules Detected on CT Images: From the Fleischner Society 2017; Radiology 2017; 284:228-243. 6.  Aortic Atherosclerosis (ICD10-I70.0). Electronically Signed   By: Donnal Fusi M.D.   On: 02/21/2024 10:05   US  ABDOMEN LIMITED RUQ  (LIVER/GB) Result Date: 02/21/2024 EXAM: Right Upper Quadrant Abdominal Ultrasound 02/21/2024 01:11:00 AM TECHNIQUE: Real-time ultrasonography of the right upper quadrant of the abdomen was performed. COMPARISON: CT abdomen/pelvis dated 02/02/2024. CLINICAL HISTORY: Upper abdominal pain. FINDINGS: LIVER: Hyperechoic hepatic parenchyma, suggesting hepatic steatosis. No evidence of intrahepatic biliary ductal dilatation. No evidence of mass. BILIARY SYSTEM: Mild irregular gallbladder wall thickening and positive sonographic Murphy's sign. Abnormal soft tissue in the gallbladder lumen, not clearly evident on prior CT, possibly reflecting tumefactive sludge. Known tiny layering gallstones, better visualized on CT. Common bile duct is within normal limits measuring . RIGHT KIDNEY: The right kidney is grossly unremarkable without evidence of hydronephrosis. PANCREAS: Visualized portions of the pancreas are unremarkable. OTHER: No evidence of right upper quadrant ascites. IMPRESSION: 1. Cholelithiasis with suspected tumefactive sludge. Associated irregular gallbladder wall thickening and positive sonographic Murphy's sign, equivocal but raising concern for acute cholecystitis. 2. Hepatic steatosis. Electronically signed by: Zadie Herter MD 02/21/2024 01:29 AM EDT RP Workstation: [id number]   DG Chest Portable 1 View Result Date: 02/21/2024 CLINICAL DATA:  Sepsis EXAM: PORTABLE  CHEST 1 VIEW COMPARISON:  11/17/2022 FINDINGS: Mild interval decrease in pulmonary insufflation. Ovoid density at the right lung base appears new from prior CT abdomen pelvis of 02/02/2024 and likely represents an area discoid atelectasis. Lungs are otherwise clear. No pneumothorax or pleural effusion. Cardiac size within normal limits. No acute bone abnormality. IMPRESSION: No active disease. Electronically Signed   By: Worthy Heads M.D.   On: 02/21/2024 01:25   CT ABDOMEN PELVIS W CONTRAST Result Date: 02/02/2024 CLINICAL DATA:   Epigastric pain EXAM: CT ABDOMEN AND PELVIS WITH CONTRAST TECHNIQUE: Multidetector CT imaging of the abdomen and pelvis was performed using the standard protocol following bolus administration of intravenous contrast. RADIATION DOSE REDUCTION: This exam was performed according to the departmental dose-optimization program which includes automated exposure control, adjustment of the mA and/or kV according to patient size and/or use of iterative reconstruction technique. CONTRAST:  OMNIPAQUE  IOHEXOL  300 MG/ML  SOLN COMPARISON:  None Available. FINDINGS: Lower chest: Dependent bibasilar subsegmental atelectasis or scarring. No pleural or pericardial effusions. Hepatobiliary: No biliary ductal dilatation. No hepatic parenchymal abnormalities. Tiny cysts noncalcified gallstones. No pericholecystic inflammatory changes. Pancreas: Unremarkable. No pancreatic ductal dilatation or surrounding inflammatory changes. Spleen: Normal in size without focal abnormality. Adrenals/Urinary Tract: Adrenal glands are unremarkable. Bilateral renal cysts identified the largest 3.7 cm on the right. No nephrolithiasis or hydronephrosis. Stomach/Bowel: Stomach is within normal limits. Appendix appears normal. No evidence of bowel wall thickening, distention, or inflammatory changes. Diverticulosis descending and sigmoid. Vascular/Lymphatic: Aortic atherosclerosis. No enlarged abdominal or pelvic lymph nodes. Reproductive: Prominent prostate, 5.6 x 4.4 x 4.4 cm. Other: No abdominal wall hernia or abnormality. No abdominopelvic ascites. Musculoskeletal: No acute or significant osseous findings. IMPRESSION: 1. Cholelithiasis. 2. Renal cysts. 3. Diverticulosis. 4. Prostatomegaly. 5. Aortic atherosclerosis. Electronically Signed   By: Sydell Eva M.D.   On: 02/02/2024 19:34    Labs:  CBC: Recent Labs    03/06/23 0604 02/02/24 1648 02/20/24 1812 02/21/24 0757  WBC 8.4 7.6 16.8* 12.0*  HGB 15.3 16.0 13.8 11.8*  HCT 48.1  49.4 43.5 35.5*  PLT 199 220 423* 343    COAGS: Recent Labs    02/21/24 0757  INR 1.4*  APTT 35    BMP: Recent Labs    03/06/23 0604 02/02/24 1729 02/20/24 1812 02/21/24 0757  NA 138 140 140 142  K 3.6 3.4* 3.2* 3.4*  CL 109 108 110 114*  CO2 23 23 22  20*  GLUCOSE 234* 142* 118* 104*  BUN 11 11 15 17   CALCIUM  8.7* 9.2 8.5* 8.0*  CREATININE 0.93 1.07 0.85 0.81  GFRNONAA >60 >60 >60 >60    LIVER FUNCTION TESTS: Recent Labs    03/06/23 0604 02/02/24 1729 02/20/24 1812 02/21/24 0757  BILITOT 0.8 0.8 1.2 3.6*  AST 22 22 214* 234*  ALT 38 29 134* 338*  ALKPHOS 107 84 262* 254*  PROT 7.5 7.7 7.3 6.1*  ALBUMIN 3.9 4.0 3.0* 2.5*    TUMOR MARKERS: No results for input(s): "AFPTM", "CEA", "CA199", "CHROMGRNA" in the last 8760 hours.  Assessment and Plan:  Douglas Smith is a 73 y.o. male with pmhx PE/DVT on Eliquis , HTN, COPD, DM2. Has been having recurrent epigastric abdominal pain for around 1 year. Was in the ED about 2 weeks ago and had unremarkable CT scan at that time and improved with GI cocktail. Now returned to the ED yesterday with recurrent of epigastric pain. Had RUQ US  showing irregular gallbladder wall thickening and positive sonographic Murphy's sign, concerning  for acute cholecystitis. Repeat ?CT abd/pelvis w contrast today shows thickened and irregular gallbladder with high density materail in the lumen, possible sludge or blood. There is also potential early perforation and pericholecystic abscess.   Case was discussed with Dr. Julietta Ogren from IR and Dr. Ofilia Benton from general surgery. Plan is for percutaneous cholecystostomy drainage tomorrow, 5/2 given pt's last dose of eliquis  was yesterday morning.  Today WBC 12.0, lactic 0.9. Pt otherwise stable now and receiving IV abx. Continuing hold of eliquis  through tomorrow for planned procedure.  Risks and benefits were discussed with the patient including, but not limited to, bleeding, infection, gallbladder  perforation, bile leak, sepsis or even death. All of the patient's questions were answered, patient is agreeable to proceed. Consent signed and in chart.   Thank you for allowing our service to participate in Douglas Smith 's care.  Electronically Signed: Nicolasa Barrett, PA-C   02/21/2024, 11:37 AM      I spent a total of 40 Minutes    in face to face in clinical consultation, greater than 50% of which was counseling/coordinating care for percutaneous cholecystostomy drain.

## 2024-02-21 NOTE — ED Provider Notes (Signed)
 [facility] Provider Note    None    (approximate)   History   Abdominal Pain   HPI  Douglas Smith is a 73 y.o. male with history of hypertension, diabetes, PE and DVT on Eliquis  who presents to the emergency department complaints of upper abdominal pain, nausea and vomiting, chills.  Was seen in the emergency department 2 weeks ago for similar symptoms and had negative CT scan.  Does report having shortness of breath when the abdominal pain gets bad but no chest pain or shortness of breath currently.  No fevers.  Also has had some dysuria.  Denies prior abdominal surgery.   Last eliquis  8am 02/20/2024.  History provided by patient, daughter by phone.    Past Medical History:  Diagnosis Date   Diabetes mellitus without complication (HCC)    DVT (deep venous thrombosis) (HCC)    HTN (hypertension)    PE (pulmonary thromboembolism) (HCC)    Tobacco abuse     Past Surgical History:  Procedure Laterality Date   left shoulder surgery Left     MEDICATIONS:  Prior to Admission medications   Medication Sig Start Date End Date Taking? Authorizing Provider  albuterol  (PROVENTIL ) (2.5 MG/3ML) 0.083% nebulizer solution Take 3 mLs (2.5 mg total) by nebulization every 4 (four) hours as needed for wheezing or shortness of breath. 10/12/21   Sung, Jade J, MD  albuterol  (VENTOLIN  HFA) 108 [phone] Base) MCG/ACT inhaler Inhale 2 puffs into the lungs every 4 (four) hours as needed for wheezing or shortness of breath. 10/12/21   Sung, Jade J, MD  apixaban  (ELIQUIS ) 5 MG TABS tablet Take 1 tablet (5 mg total) by mouth 2 (two) times daily. 04/28/21 05/28/21  Leonora Ramus, MD  atorvastatin  (LIPITOR) 40 MG tablet Take 1 tablet (40 mg total) by mouth daily. 04/29/21 09/03/23  Leonora Ramus, MD  empagliflozin  (JARDIANCE ) 25 MG TABS tablet Take by mouth. 08/15/23   [provider]  glipiZIDE  (GLUCOTROL ) 10 MG tablet Take by mouth. 08/15/23   [provider]   hydrochlorothiazide  (HYDRODIURIL ) 25 MG tablet Take 25 mg by mouth daily. 06/15/20   [provider]  losartan  (COZAAR ) 50 MG tablet Take by mouth. 08/15/23   [provider]  Nebulizers (COMPRESSOR/NEBULIZER) MISC 1 Units by Does not apply route every 4 (four) hours as needed. 10/12/21   Sung, Jade J, MD  nicotine  (NICODERM CQ  - DOSED IN MG/24 HOURS) 21 mg/24hr patch Place 1 patch (21 mg total) onto the skin daily. 04/15/21   Althia Atlas, MD  omeprazole  (PRILOSEC  OTC) 20 MG tablet Take 1 tablet (20 mg total) by mouth daily. 03/06/23 03/05/24  Bryson Carbine, MD    Physical Exam   Triage Vital Signs: ED Triage Vitals  Encounter Vitals Group     BP 02/20/24 1810 122/76     Systolic BP Percentile --      Diastolic BP Percentile --      Pulse Rate 02/20/24 1810 98     Resp 02/20/24 1810 20     Temp 02/20/24 1810 98.4 F (36.9 C)     Temp Source 02/20/24 1810 Oral     SpO2 02/20/24 1810 94 %     Weight 02/20/24 1811 185 lb (83.9 kg)     Height 02/20/24 1811 6' (1.829 m)     Head Circumference --      Peak Flow --      Pain Score 02/20/24 1818 8     Pain Loc --  Pain Education --      Exclude from Growth Chart --     Most recent vital signs: Vitals:   02/21/24 0140 02/21/24 0144  BP: 109/76   Pulse: 86   Resp: (!) 23   Temp:  98.8 F (37.1 C)  SpO2: 96%     CONSTITUTIONAL: Alert, responds appropriately to questions. Well-appearing; well-nourished HEAD: Normocephalic, atraumatic EYES: Conjunctivae clear, pupils appear equal, sclera nonicteric ENT: normal nose; moist mucous membranes NECK: Supple, normal ROM CARD: RRR; S1 and S2 appreciated RESP: Normal chest excursion without splinting or tachypnea; breath sounds clear and equal bilaterally; no wheezes, no rhonchi, no rales, no hypoxia or respiratory distress, speaking full sentences ABD/GI: Non-distended; soft, tender throughout the upper abdomen, no tenderness at McBurney's point BACK: The back  appears normal EXT: Normal ROM in all joints; no deformity noted, no edema SKIN: Normal color for age and race; warm; no rash on exposed skin NEURO: Moves all extremities equally, normal speech PSYCH: The patient's mood and manner are appropriate.   ED Results / Procedures / Treatments   LABS: (all labs ordered are listed, but only abnormal results are displayed) Labs Reviewed  COMPREHENSIVE METABOLIC PANEL WITH GFR - Abnormal; Notable for the following components:      Result Value   Potassium 3.2 (*)    Glucose, Bld 118 (*)    Calcium  8.5 (*)    Albumin 3.0 (*)    AST 214 (*)    ALT 134 (*)    Alkaline Phosphatase 262 (*)    All other components within normal limits  CBC - Abnormal; Notable for the following components:   WBC 16.8 (*)    RDW 16.5 (*)    Platelets 423 (*)    All other components within normal limits  URINALYSIS, ROUTINE W REFLEX MICROSCOPIC - Abnormal; Notable for the following components:   Color, Urine AMBER (*)    APPearance CLEAR (*)    Glucose, UA >=500 (*)    Hgb urine dipstick SMALL (*)    Nitrite POSITIVE (*)    Bacteria, UA RARE (*)    All other components within normal limits  TROPONIN I (HIGH SENSITIVITY) - Abnormal; Notable for the following components:   Troponin I (High Sensitivity) 18 (*)    All other components within normal limits  TROPONIN I (HIGH SENSITIVITY) - Abnormal; Notable for the following components:   Troponin I (High Sensitivity) 21 (*)    All other components within normal limits  URINE CULTURE  CULTURE, BLOOD (ROUTINE X 2)  CULTURE, BLOOD (ROUTINE X 2)  LIPASE, BLOOD  LACTIC ACID, PLASMA  LACTIC ACID, PLASMA     EKG:  EKG Interpretation Date/Time:  Wednesday February 20 2024 18:18:23 EDT Ventricular Rate:  102 PR Interval:  150 QRS Duration:  100 QT Interval:  368 QTC Calculation: 479 R Axis:   -46  Text Interpretation: Sinus tachycardia Incomplete right bundle branch block Left anterior fascicular block  Moderate voltage criteria for LVH, may be normal variant ( R in aVL , Cornell product ) Septal infarct , age undetermined Abnormal ECG When compared with ECG of 02-Feb-2024 16:49, Left anterior fascicular block is now Present Incomplete right bundle branch block is now Present Septal infarct is now Present Confirmed by Verneda Golder [phone]) on 02/21/2024 12:21:59 AM         RADIOLOGY: My personal review and interpretation of imaging: Cholecystitis.  No pneumonia.  I have personally reviewed all radiology reports.   US  ABDOMEN  LIMITED RUQ (LIVER/GB) Result Date: 02/21/2024 EXAM: Right Upper Quadrant Abdominal Ultrasound 02/21/2024 01:11:00 AM TECHNIQUE: Real-time ultrasonography of the right upper quadrant of the abdomen was performed. COMPARISON: CT abdomen/pelvis dated 02/02/2024. CLINICAL HISTORY: Upper abdominal pain. FINDINGS: LIVER: Hyperechoic hepatic parenchyma, suggesting hepatic steatosis. No evidence of intrahepatic biliary ductal dilatation. No evidence of mass. BILIARY SYSTEM: Mild irregular gallbladder wall thickening and positive sonographic Murphy's sign. Abnormal soft tissue in the gallbladder lumen, not clearly evident on prior CT, possibly reflecting tumefactive sludge. Known tiny layering gallstones, better visualized on CT. Common bile duct is within normal limits measuring . RIGHT KIDNEY: The right kidney is grossly unremarkable without evidence of hydronephrosis. PANCREAS: Visualized portions of the pancreas are unremarkable. OTHER: No evidence of right upper quadrant ascites. IMPRESSION: 1. Cholelithiasis with suspected tumefactive sludge. Associated irregular gallbladder wall thickening and positive sonographic Murphy's sign, equivocal but raising concern for acute cholecystitis. 2. Hepatic steatosis. Electronically signed by: Zadie Herter MD 02/21/2024 01:29 AM EDT RP Workstation: [id number]   DG Chest Portable 1 View Result Date: 02/21/2024 CLINICAL DATA:  Sepsis EXAM:  PORTABLE CHEST 1 VIEW COMPARISON:  11/17/2022 FINDINGS: Mild interval decrease in pulmonary insufflation. Ovoid density at the right lung base appears new from prior CT abdomen pelvis of 02/02/2024 and likely represents an area discoid atelectasis. Lungs are otherwise clear. No pneumothorax or pleural effusion. Cardiac size within normal limits. No acute bone abnormality. IMPRESSION: No active disease. Electronically Signed   By: Worthy Heads M.D.   On: 02/21/2024 01:25     PROCEDURES:  Critical Care performed: Yes, see critical care procedure note(s)   CRITICAL CARE Performed by: Starling Eck Lucina Betty   Total critical care time: 30 minutes  Critical care time was exclusive of separately billable procedures and treating other patients.  Critical care was necessary to treat or prevent imminent or life-threatening deterioration.  Critical care was time spent personally by me on the following activities: development of treatment plan with patient and/or surrogate as well as nursing, discussions with consultants, evaluation of patient's response to treatment, examination of patient, obtaining history from patient or surrogate, ordering and performing treatments and interventions, ordering and review of laboratory studies, ordering and review of radiographic studies, pulse oximetry and re-evaluation of patient's condition.   [name]1-3 Lead EKG Interpretation  Performed by: Noriah Osgood, Clover Dao, DO Authorized by: Evalee Gerard, Clover Dao, DO     Interpretation: abnormal     ECG rate:  104   ECG rate assessment: tachycardic     Rhythm: sinus tachycardia     Ectopy: none     Conduction: normal       IMPRESSION / MDM / ASSESSMENT AND PLAN / ED COURSE  I reviewed the triage vital signs and the nursing notes.    Patient here with upper abdominal pain, vomiting, chills.  The patient is on the cardiac monitor to evaluate for evidence of arrhythmia and/or significant heart rate changes.   DIFFERENTIAL  DIAGNOSIS (includes but not limited to):   Cholelithiasis, cholecystitis, cholangitis, choledocholithiasis, pancreatitis, gastritis, GERD, viral illness, pneumonia, ACS, PE  Patient's presentation is most consistent with acute presentation with potential threat to life or bodily function.   PLAN: Workup initiated from triage.  Patient has a leukocytosis of 16,000.  AST, ALT, alkaline phosphatase elevated but normal total bilirubin.  Normal lipase.  Does have a nitrite positive UTI.  Will add on culture.  Patient does have a low-grade temperature of 99 and has been tachycardic.  Given this with his leukocytosis  I am concerned for sepsis.  Will obtain blood cultures, lactic.  Will give Rocephin , IV fluids.  Will obtain right upper quadrant abdominal ultrasound, chest x-ray.   MEDICATIONS GIVEN IN ED: Medications  cefTRIAXone  (ROCEPHIN ) 2 g in sodium chloride  0.9 % 100 mL IVPB (2 g Intravenous New Bag/Given 02/21/24 0143)  sodium chloride  0.9 % bolus 1,000 mL (1,000 mLs Intravenous New Bag/Given 02/21/24 0128)  0.9 %  sodium chloride  infusion (has no administration in time range)  metroNIDAZOLE  (FLAGYL ) IVPB 500 mg (has no administration in time range)  ondansetron  (ZOFRAN -ODT) disintegrating tablet 4 mg (4 mg Oral Given 02/20/24 1823)  fentaNYL  (SUBLIMAZE ) injection 50 mcg (50 mcg Intravenous Given 02/21/24 0129)  ondansetron  (ZOFRAN ) injection 4 mg (4 mg Intravenous Given 02/21/24 0129)     ED COURSE: Patient's imaging reviewed and interpreted by myself and the radiologist.  Chest x-ray clear.  Right upper quadrant ultrasound shows gallstones, sludge, gallbladder wall thickening, positive Murphy sign concerning for acute cholecystitis.  He is getting Rocephin  and Flagyl .  Will discuss with general surgery.   Discussed with Dr. Ofilia Benton with general surgery.  He will see in consultation.  Given patient's multiple medical problems, he may be a better candidate for cholecystostomy drain.  Will discuss  with hospitalist for admission.  CONSULTS:  Consulted and discussed patient's case with hospitalist, Dr. Achilles Holes.  I have recommended admission and consulting physician agrees and will place admission orders.  Patient (and family if present) agree with this plan.   I reviewed all nursing notes, vitals, pertinent previous records.  All labs, EKGs, imaging ordered have been independently reviewed and interpreted by myself.    OUTSIDE RECORDS REVIEWED: Reviewed recent [facility].       FINAL CLINICAL IMPRESSION(S) / ED DIAGNOSES   Final diagnoses:  Acute UTI  Acute cholecystitis  Acute sepsis (HCC)     Rx / DC Orders   ED Discharge Orders     None        Note:  This document was prepared using Dragon voice recognition software and may include unintentional dictation errors.   Promiss Labarbera, Clover Dao, DO 02/21/24 [phone]

## 2024-02-22 ENCOUNTER — Inpatient Hospital Stay: Admitting: Radiology

## 2024-02-22 DIAGNOSIS — K810 Acute cholecystitis: Secondary | ICD-10-CM | POA: Diagnosis not present

## 2024-02-22 HISTORY — PX: IR PERC CHOLECYSTOSTOMY: IMG2326

## 2024-02-22 LAB — COMPREHENSIVE METABOLIC PANEL WITH GFR
ALT: 224 U/L — ABNORMAL HIGH (ref 0–44)
AST: 90 U/L — ABNORMAL HIGH (ref 15–41)
Albumin: 2.2 g/dL — ABNORMAL LOW (ref 3.5–5.0)
Alkaline Phosphatase: 218 U/L — ABNORMAL HIGH (ref 38–126)
Anion gap: 6 (ref 5–15)
BUN: 9 mg/dL (ref 8–23)
CO2: 21 mmol/L — ABNORMAL LOW (ref 22–32)
Calcium: 7.9 mg/dL — ABNORMAL LOW (ref 8.9–10.3)
Chloride: 114 mmol/L — ABNORMAL HIGH (ref 98–111)
Creatinine, Ser: 0.86 mg/dL (ref 0.61–1.24)
GFR, Estimated: >60 mL/min (ref >60)
Glucose, Bld: 107 mg/dL — ABNORMAL HIGH (ref 70–99)
Potassium: 3.7 mmol/L (ref 3.5–5.1)
Sodium: 141 mmol/L (ref 135–145)
Total Bilirubin: 1.4 mg/dL — ABNORMAL HIGH (ref 0.0–1.2)
Total Protein: 5.7 g/dL — ABNORMAL LOW (ref 6.5–8.1)

## 2024-02-22 LAB — GLUCOSE, CAPILLARY
Glucose-Capillary: 97 mg/dL (ref 70–99)
Glucose-Capillary: 84 mg/dL (ref 70–99)
Glucose-Capillary: 176 mg/dL — ABNORMAL HIGH (ref 70–99)
Glucose-Capillary: 158 mg/dL — ABNORMAL HIGH (ref 70–99)

## 2024-02-22 LAB — CBC
HCT: 37.1 % — ABNORMAL LOW (ref 39.0–52.0)
Hemoglobin: 11.5 g/dL — ABNORMAL LOW (ref 13.0–17.0)
MCH: 25.4 pg — ABNORMAL LOW (ref 26.0–34.0)
MCHC: 31 g/dL (ref 30.0–36.0)
MCV: 82.1 fL (ref 80.0–100.0)
Platelets: 291 10*3/uL (ref 150–400)
RBC: 4.52 MIL/uL (ref 4.22–5.81)
RDW: 16.6 % — ABNORMAL HIGH (ref 11.5–15.5)
WBC: 7.4 10*3/uL (ref 4.0–10.5)
nRBC: 0 % (ref 0.0–0.2)

## 2024-02-22 LAB — PROTIME-INR
INR: 1.2 (ref 0.8–1.2)
Prothrombin Time: 15.8 s — ABNORMAL HIGH (ref 11.4–15.2)

## 2024-02-22 MED ORDER — LIDOCAINE HCL 1 % IJ SOLN
20.0000 mL | Freq: Once | INTRAMUSCULAR | Status: AC
  Administered 2024-02-22: 10 mL via INTRADERMAL
  Filled 2024-02-22: qty 20

## 2024-02-22 MED ORDER — FENTANYL CITRATE (PF) 100 MCG/2ML IJ SOLN
INTRAMUSCULAR | Status: AC
  Filled 2024-02-22: qty 2

## 2024-02-22 MED ORDER — MIDAZOLAM HCL 2 MG/2ML IJ SOLN
INTRAMUSCULAR | Status: DC | PRN
  Administered 2024-02-22 (×2): 1 mg via INTRAVENOUS

## 2024-02-22 MED ORDER — APIXABAN 5 MG PO TABS
5.0000 mg | ORAL_TABLET | Freq: Two times a day (BID) | ORAL | Status: DC
  Administered 2024-02-22 – 2024-02-25 (×6): 5 mg via ORAL
  Filled 2024-02-22 (×6): qty 1

## 2024-02-22 MED ORDER — LIDOCAINE HCL 1 % IJ SOLN
INTRAMUSCULAR | Status: AC
  Filled 2024-02-22: qty 20

## 2024-02-22 MED ORDER — SODIUM CHLORIDE 0.9% FLUSH
5.0000 mL | Freq: Three times a day (TID) | INTRAVENOUS | Status: DC
  Administered 2024-02-22 – 2024-02-25 (×10): 5 mL

## 2024-02-22 MED ORDER — IOHEXOL 300 MG/ML  SOLN
50.0000 mL | Freq: Once | INTRAMUSCULAR | Status: AC | PRN
Start: 2024-02-22 — End: 2024-02-22
  Administered 2024-02-22: 10 mL

## 2024-02-22 MED ORDER — FENTANYL CITRATE (PF) 100 MCG/2ML IJ SOLN
INTRAMUSCULAR | Status: DC | PRN
  Administered 2024-02-22 (×2): 50 ug via INTRAVENOUS

## 2024-02-22 MED ORDER — MIDAZOLAM HCL 2 MG/2ML IJ SOLN
INTRAMUSCULAR | Status: AC
  Filled 2024-02-22: qty 2

## 2024-02-22 NOTE — Progress Notes (Signed)
 Patient clinically stable post IR Chole drain placement, per Dr Mabel Savage, tolerated well. Dark tan/red drainage , received Versed  2 mg along with Fentanyl  100 mcg IV for procedure. Report given to Merribeth Abbott RN post procedure/18/specials.

## 2024-02-22 NOTE — Progress Notes (Addendum)
 Progress Note   Patient: Douglas Smith WUJ:[id number] DOB: 1951/05/29 DOA: 02/21/2024     1 DOS: the patient was seen and examined on 02/22/2024   Brief hospital course: "Douglas Smith is a 73 y.o. male with medical history significant of PE/DVT on Eliquis , HTN, COPD, IIDM, presented with recurrent epigastric pain.   Patient started to have intermittent epigastric pain about 3 to 4 weeks ago, came to ED 1 time 2 weeks ago, CT scan did not show significant findings other than cholelithiasis, and patient was given GI cocktail and symptoms improved and patient was reassured and sent home.  Last night, patient started to have same symptoms, with cramping-like epigastric pain, 10/10, associated with nausea but no vomiting no diarrhea no fever or chills.  Denies any cough no chest pains.   ED Course: Temperature 99.0, not tachycardia nonhypotensive not hypoxic.  Lab work showed WBC 16.8 K3.2 AST 214 ALT 134, bilirubin 1.2 lipase within normal limits.  RUQ ultrasound showed cholelithiasis with suspected tumefactive sludge associated with irregular gallbladder wall thickening and positive sonographic Murphy sign concerning about acute cholecystitis."   Patient was started on cefepime  and Flagyl  and admitted for further evaluation and management as outlined below.    Assessment and Plan:  Acute cholecystitis --Cefepime  and Flagyl  --General surgery consulted and recommended non-surgical management with percutaneous biliary drain, given the history of PE on Eliquis  and acute UTI.   --IR placed drain this morning, monitor output --Pain control and antiemetics PRN  Hyperchloremic metabolic acidosis Due to high volume NS fluids on admission --Diet resumed today post-procedure --Stop IV fluids --Repeat BMP in AM   Acute Transaminitis - Likely related to acute cholecystitis. --Trend LFT's --Holding statin   UTI -ABX as above   DVT and PE --Heparin  drip for now --Holding Eliquis     HTN --Continue HCTZ and losartan    HLD --Holding statin  Former smoker -- d/c nicotine  patch. Patient successfully stopped smoking after diagnosis of blood clots.    Subjective: Pt seen awake resting in bed today. Reports feeling better since admission. Currently pain at site of the drain only, otherwise denies abdominal pain or nausea/vomiting.  No fever/chills.   Physical Exam: Vitals:   02/22/24 1058 02/22/24 1109 02/22/24 1115 02/22/24 1130  BP: (!) 148/92 (!) 139/93 (!) 144/90 (!) 141/85  Pulse: 71 70 69 70  Resp: 13 18 17  (!) 26  Temp:   98 F (36.7 C)   TempSrc:   Oral   SpO2: 98% 97% 97% 96%  Weight:      Height:       General exam: awake, alert, no acute distress HEENT: atraumatic, clear conjunctiva, anicteric sclera, moist mucus membranes, hearing grossly normal  Respiratory system: CTAB, no wheezes, rales or rhonchi, normal respiratory effort. Cardiovascular system: normal S1/S2, RRR, no JVD, murmurs, rubs, gallops, no pedal edema.   Gastrointestinal system: perc biliary drain in place with dark brown/maroon fluid in bad and tubing, abdomen is soft, NT, ND Central nervous system: A&O x3. no gross focal neurologic deficits, normal speech Extremities: moves all, no edema, normal tone Skin: dry, intact, normal temperature Psychiatry: normal mood, congruent affect, judgement and insight appear normal   Data Reviewed:  Notable labs --   Cl 114 Bicarb 21 Glucose 107 Ca 7.9 / albumin 2.2 AST 90 ALT 224 Tbili 1.4 Hbg 11.5  Family Communication: None present. Pt updated in detail.  Disposition: Status is: Inpatient Remains inpatient appropriate because: on empiric IV antibiotics pending culture results   Planned  Discharge Destination: Home    Time spent: 45 minutes  Author: Montey Apa, DO 02/22/2024 2:26 PM  For on call review [email or web address].

## 2024-02-22 NOTE — Procedures (Signed)
 Interventional Radiology Procedure Note  Procedure: Image guided perc chole drain placement.  50F pigtail drain.  Complications: None  EBL: None Sample: Culture sent  Recommendations: - Routine drain care, with sterile flushes, record output - follow up Cx - routine wound care  Signed,  Marciano Settles. Mabel Savage, DO

## 2024-02-22 NOTE — Plan of Care (Signed)
   Problem: Activity: Goal: Risk for activity intolerance will decrease Outcome: Progressing   Problem: Nutrition: Goal: Adequate nutrition will be maintained Outcome: Progressing   Problem: Pain Managment: Goal: General experience of comfort will improve and/or be controlled Outcome: Progressing

## 2024-02-22 NOTE — Plan of Care (Signed)
  Problem: Fluid Volume: Goal: Hemodynamic stability will improve Outcome: Progressing   Problem: Clinical Measurements: Goal: Diagnostic test results will improve Outcome: Progressing Goal: Signs and symptoms of infection will decrease Outcome: Progressing   Problem: Respiratory: Goal: Ability to maintain adequate ventilation will improve Outcome: Progressing   Problem: Education: Goal: Ability to describe self-care measures that may prevent or decrease complications (Diabetes Survival Skills Education) will improve Outcome: Progressing Goal: Individualized Educational Video(s) Outcome: Progressing   Problem: Coping: Goal: Ability to adjust to condition or change in health will improve Outcome: Progressing   Problem: Fluid Volume: Goal: Ability to maintain a balanced intake and output will improve Outcome: Progressing   Problem: Health Behavior/Discharge Planning: Goal: Ability to identify and utilize available resources and services will improve Outcome: Progressing Goal: Ability to manage health-related needs will improve Outcome: Progressing   Problem: Metabolic: Goal: Ability to maintain appropriate glucose levels will improve Outcome: Progressing   Problem: Nutritional: Goal: Maintenance of adequate nutrition will improve Outcome: Progressing Goal: Progress toward achieving an optimal weight will improve Outcome: Progressing   Problem: Skin Integrity: Goal: Risk for impaired skin integrity will decrease Outcome: Progressing   Problem: Tissue Perfusion: Goal: Adequacy of tissue perfusion will improve Outcome: Progressing   Problem: Education: Goal: Knowledge of General Education information will improve Description: Including pain rating scale, medication(s)/side effects and non-pharmacologic comfort measures Outcome: Progressing   Problem: Health Behavior/Discharge Planning: Goal: Ability to manage health-related needs will improve Outcome:  Progressing   Problem: Clinical Measurements: Goal: Ability to maintain clinical measurements within normal limits will improve Outcome: Progressing Goal: Will remain free from infection Outcome: Progressing Goal: Diagnostic test results will improve Outcome: Progressing Goal: Respiratory complications will improve Outcome: Progressing Goal: Cardiovascular complication will be avoided Outcome: Progressing   Problem: Activity: Goal: Risk for activity intolerance will decrease Outcome: Progressing   Problem: Activity: Goal: Risk for activity intolerance will decrease Outcome: Progressing   Problem: Coping: Goal: Level of anxiety will decrease Outcome: Progressing   Problem: Elimination: Goal: Will not experience complications related to bowel motility Outcome: Progressing Goal: Will not experience complications related to urinary retention Outcome: Progressing   Problem: Pain Managment: Goal: General experience of comfort will improve and/or be controlled Outcome: Progressing   Problem: Safety: Goal: Ability to remain free from injury will improve Outcome: Progressing   Problem: Skin Integrity: Goal: Risk for impaired skin integrity will decrease Outcome: Progressing

## 2024-02-22 NOTE — Progress Notes (Signed)
  [facility] SUR[facility] PROGRESS NOTE (cpt [phone])  Hospital Day(s): 1.   Post op day(s):  Douglas Smith   Interval History: Patient seen and examined, no acute events or new complaints overnight. Patient reports feeling somewhat better, denies anorexia, nausea or vomiting..  Review of Systems:  Constitutional: denies fever, chills  HEENT: denies cough or congestion  Respiratory: denies any shortness of breath  Cardiovascular: denies chest pain or palpitations  Gastrointestinal: denies any worsening abdominal pain, N/V, or diarrhea Genitourinary: denies burning with urination or urinary frequency Musculoskeletal: denies pain, decreased motor or sensation Integumentary: denies any other rashes or skin discolorations Neurological: denies HA or vision/hearing changes   Vital signs in last 24 hours: [min-max] current  Temp:  [97.9 F (36.6 C)-99 F (37.2 C)] 99 F (37.2 C) (05/02 0839) Pulse Rate:  [71-79] 71 (05/02 0839) Resp:  [18-21] 18 (05/02 0839) BP: (121-134)/(76-87) 133/87 (05/02 0839) SpO2:  [97 %-100 %] 99 % (05/02 0839)     Height: 6' (182.9 cm) Weight: 83.9 kg BMI (Calculated): 25.08   Intake/Output last 2 shifts:  05/01 0701 - 05/02 0700 In: 1553 [I.V.:1443; IV Piggyback:110] Out: 2500 [Urine:2500]   Physical Exam:  Constitutional: alert, cooperative and no distress  HENT: normocephalic without obvious abnormality  Respiratory: breathing non-labored at rest  Cardiovascular: regular rate and rhythm  Gastrointestinal: soft, mild right upper quadrant tenderness, otherwise non-tender, and non-distended Musculoskeletal: UE and LE FROM, no edema or wounds, motor and sensation grossly intact, NT    Labs:     Latest Ref Rng & Units 02/22/2024    4:22 AM 02/21/2024    7:57 AM 02/20/2024    6:12 PM  CBC  WBC 4.0 - 10.5 K/uL 7.4  12.0  16.8   Hemoglobin 13.0 - 17.0 g/dL 25.9  56.3  87.5   Hematocrit 39.0 - 52.0 % 37.1  35.5  43.5   Platelets 150 - 400 K/uL 291  343   423       Latest Ref Rng & Units 02/22/2024    4:22 AM 02/21/2024    7:57 AM 02/20/2024    6:12 PM  CMP  Glucose 70 - 99 mg/dL 643  329  518   BUN 8 - 23 mg/dL 9  17  15    Creatinine 0.61 - 1.24 mg/dL 8.41  6.60  6.30   Sodium 135 - 145 mmol/L 141  142  140   Potassium 3.5 - 5.1 mmol/L 3.7  3.4  3.2   Chloride 98 - 111 mmol/L 114  114  110   CO2 22 - 32 mmol/L 21  20  22    Calcium  8.9 - 10.3 mg/dL 7.9  8.0  8.5   Total Protein 6.5 - 8.1 g/dL 5.7  6.1  7.3   Total Bilirubin 0.0 - 1.2 mg/dL 1.4  3.6  1.2   Alkaline Phos 38 - 126 U/L 218  254  262   AST 15 - 41 U/L 90  234  214   ALT 0 - 44 U/L 224  338  134      Imaging studies: No new pertinent imaging studies   Assessment/Plan:  73 y.o. male with complicated acute on chronic calculus cholecystitis, leukocytosis improved with antibiotics for percutaneous cholecystostomy plan for today Patient Active Problem List   Diagnosis Date Noted   Acute cholecystitis 02/21/2024   Acute sepsis (HCC) 02/21/2024   TIA (transient ischemic attack) 04/27/2021   Acute pulmonary embolism (HCC) 04/12/2021   Tobacco abuse 04/12/2021  DVT (deep venous thrombosis) (HCC) 04/12/2021   HTN (hypertension)    Lung nodule    Thyroid nodule    Leukocytosis    Hypokalemia    Elevated troponin      - Anticipating continued response to antibiotics, I believe a percutaneous drainage may still be helpful.  - Discussed with patient and his daughter the long-term plan, anticipated duration of percutaneous drainage, resumption of diet, outpatient follow-up, and eventual cholecystectomy when inflammatory/infectious process has approached optimal risk for surgery.  - Appreciate IR assistance in taking care of this gentleman.  - Remainder per primary service.  All of the above findings and recommendations were discussed with the patient, patient's family, and the medical team, and all of patient's and family's questions were answered to their expressed  satisfaction.   -- Douglas Hylan M.D., [facility] 02/22/2024 9:18 AM

## 2024-02-23 DIAGNOSIS — K810 Acute cholecystitis: Secondary | ICD-10-CM | POA: Diagnosis not present

## 2024-02-23 LAB — COMPREHENSIVE METABOLIC PANEL WITH GFR
ALT: 148 U/L — ABNORMAL HIGH (ref 0–44)
AST: 30 U/L (ref 15–41)
Albumin: 2.3 g/dL — ABNORMAL LOW (ref 3.5–5.0)
Alkaline Phosphatase: 189 U/L — ABNORMAL HIGH (ref 38–126)
Anion gap: 9 (ref 5–15)
BUN: 7 mg/dL — ABNORMAL LOW (ref 8–23)
CO2: 22 mmol/L (ref 22–32)
Calcium: 8 mg/dL — ABNORMAL LOW (ref 8.9–10.3)
Chloride: 106 mmol/L (ref 98–111)
Creatinine, Ser: 0.74 mg/dL (ref 0.61–1.24)
GFR, Estimated: >60 mL/min (ref >60)
Glucose, Bld: 131 mg/dL — ABNORMAL HIGH (ref 70–99)
Potassium: 3.2 mmol/L — ABNORMAL LOW (ref 3.5–5.1)
Sodium: 137 mmol/L (ref 135–145)
Total Bilirubin: 1 mg/dL (ref 0.0–1.2)
Total Protein: 5.9 g/dL — ABNORMAL LOW (ref 6.5–8.1)

## 2024-02-23 LAB — CBC
HCT: 36 % — ABNORMAL LOW (ref 39.0–52.0)
Hemoglobin: 11.7 g/dL — ABNORMAL LOW (ref 13.0–17.0)
MCH: 26.5 pg (ref 26.0–34.0)
MCHC: 32.5 g/dL (ref 30.0–36.0)
MCV: 81.4 fL (ref 80.0–100.0)
Platelets: 306 10*3/uL (ref 150–400)
RBC: 4.42 MIL/uL (ref 4.22–5.81)
RDW: 16.4 % — ABNORMAL HIGH (ref 11.5–15.5)
WBC: 8.1 10*3/uL (ref 4.0–10.5)
nRBC: 0 % (ref 0.0–0.2)

## 2024-02-23 LAB — GLUCOSE, CAPILLARY
Glucose-Capillary: 135 mg/dL — ABNORMAL HIGH (ref 70–99)
Glucose-Capillary: 157 mg/dL — ABNORMAL HIGH (ref 70–99)
Glucose-Capillary: 133 mg/dL — ABNORMAL HIGH (ref 70–99)
Glucose-Capillary: 119 mg/dL — ABNORMAL HIGH (ref 70–99)

## 2024-02-23 LAB — URINE CULTURE: Culture: >=100000 — AB

## 2024-02-23 LAB — MAGNESIUM: Magnesium: 2 mg/dL (ref 1.7–2.4)

## 2024-02-23 MED ORDER — MORPHINE SULFATE (PF) 2 MG/ML IV SOLN: 2.0000 mg | INTRAVENOUS | Status: DC | PRN

## 2024-02-23 MED ORDER — OXYCODONE HCL 5 MG PO TABS: 10.0000 mg | ORAL_TABLET | ORAL | Status: DC | PRN

## 2024-02-23 MED ORDER — OXYCODONE HCL 5 MG PO TABS: 5.0000 mg | ORAL_TABLET | ORAL | Status: DC | PRN

## 2024-02-23 MED ORDER — POTASSIUM CHLORIDE CRYS ER 20 MEQ PO TBCR
40.0000 meq | EXTENDED_RELEASE_TABLET | Freq: Once | ORAL | Status: AC
Start: 2024-02-23 — End: 2024-02-23
  Administered 2024-02-23: 40 meq via ORAL
  Filled 2024-02-23: qty 2

## 2024-02-23 MED ORDER — POTASSIUM CHLORIDE CRYS ER 20 MEQ PO TBCR: 40.0000 meq | EXTENDED_RELEASE_TABLET | ORAL | Status: DC

## 2024-02-23 NOTE — Progress Notes (Addendum)
 Progress Note   Patient: Douglas Smith ZOX:[id number] DOB: 18-May-1951 DOA: 02/21/2024     2 DOS: the patient was seen and examined on 02/23/2024   Brief hospital course: "Douglas Smith is a 73 y.o. male with medical history significant of PE/DVT on Eliquis , HTN, COPD, IIDM, presented with recurrent epigastric pain.   Patient started to have intermittent epigastric pain about 3 to 4 weeks ago, came to ED 1 time 2 weeks ago, CT scan did not show significant findings other than cholelithiasis, and patient was given GI cocktail and symptoms improved and patient was reassured and sent home.  Last night, patient started to have same symptoms, with cramping-like epigastric pain, 10/10, associated with nausea but no vomiting no diarrhea no fever or chills.  Denies any cough no chest pains.   ED Course: Temperature 99.0, not tachycardia nonhypotensive not hypoxic.  Lab work showed WBC 16.8 K3.2 AST 214 ALT 134, bilirubin 1.2 lipase within normal limits.  RUQ ultrasound showed cholelithiasis with suspected tumefactive sludge associated with irregular gallbladder wall thickening and positive sonographic Murphy sign concerning about acute cholecystitis."   Patient was started on cefepime  and Flagyl  and admitted for further evaluation and management as outlined below.    Assessment and Plan:  Acute cholecystitis --Cefepime  and Flagyl  --General surgery consulted and recommended non-surgical management with percutaneous biliary drain, given the history of PE on Eliquis  and acute UTI.   --IR placed drain this morning, monitor output --Pain control and antiemetics PRN  Hyperchloremic metabolic acidosis Due to high volume NS fluids on admission --Diet resumed today post-procedure --Stop IV fluids --Repeat BMP in AM   Acute Transaminitis - Likely related to acute cholecystitis. --Trend LFT's --Holding statin  Hypokalemia - K 3.2 this AM --K replacement ordered --Monitor BMP, replace K  PRN --Check Mg level   UTI --ABX as above --Follow urine culture results   DVT and PE --Heparin  drip for now --Holding Eliquis    HTN --Continue HCTZ and losartan    HLD --Holding statin  Former smoker -- d/c nicotine  patch. Patient successfully stopped smoking after diagnosis of blood clots.    Subjective: Pt seen awake resting in bed today. He reports feeling overall better.  Having some abdominal pain and at site of the drain.  Ate part of breakfast and tolerated without nausea/vomiting, but didn't eat very much, worried for recurrent stomach upset.  No other acute complaints including fever/chills.    Physical Exam: Vitals:   02/22/24 1548 02/22/24 2025 02/23/24 0345 02/23/24 0832  BP: 133/77 132/82 134/80 (!) 141/93  Pulse: 90 89 76 84  Resp: 18 20 16 16   Temp: 98.3 F (36.8 C) 98.4 F (36.9 C) 98.3 F (36.8 C) 98.4 F (36.9 C)  TempSrc: Oral Oral Oral   SpO2: 100% 98% 94% 97%  Weight:      Height:       General exam: awake, alert, no acute distress HEENT: poor dentition, moist mucus membranes, hearing grossly normal  Respiratory system: CTAB, no wheezes, rales or rhonchi, normal respiratory effort. Cardiovascular system: normal S1/S2, RRR, no JVD, murmurs, rubs, gallops, no pedal edema.   Gastrointestinal system: perc biliary drain in place with dark brown fluid in bad and tubing, abdomen is soft, NT, ND Central nervous system: A&O x3. no gross focal neurologic deficits, normal speech Extremities: moves all, no edema, normal tone Skin: dry, intact, normal temperature Psychiatry: normal mood, congruent affect, judgement and insight appear normal   Data Reviewed:  Notable labs --   K 3.2  Glucose 131 BUN 7 Ca 8.0 / albumin 2.3 AST 90 >> 30 normalized ALT 224 >> 148 improving Tbili 1.4 >> 1.0 normalized  Hbg 11.5 >> 11.7 stable  Family Communication: None present. Pt updated in detail.  Disposition: Status is: Inpatient Remains inpatient  appropriate because: on empiric IV antibiotics pending culture results   Planned Discharge Destination: Home    Time spent: 38 minutes  Author: Montey Apa, DO 02/23/2024 1:56 PM  For on call review [email or web address].

## 2024-02-23 NOTE — Progress Notes (Signed)
 Patient ID: Douglas Smith, male   DOB: Aug 09, 1951, 73 y.o.   MRN: [id number]     SURGICAL PROGRESS NOTE   Hospital Day(s): 2.   Interval History: Patient seen and examined, no acute events or new complaints overnight. Patient reports feeling significant pain in the right upper quadrant.  Patient significant pain from procedure yesterday.  Otherwise no other complaints.  No radiation.  No specific aggravating factors.  Elevating factor is pain medication.  Denies any nausea.  Vital signs in last 24 hours: [min-max] current  Temp:  [98 F (36.7 C)-98.4 F (36.9 C)] 98.4 F (36.9 C) (05/03 0832) Pulse Rate:  [69-90] 84 (05/03 0832) Resp:  [13-27] 16 (05/03 0832) BP: (132-167)/(77-108) 141/93 (05/03 0832) SpO2:  [93 %-100 %] 97 % (05/03 0832)     Height: 6' (182.9 cm) Weight: 83.9 kg BMI (Calculated): 25.08   Physical Exam:  Constitutional: alert, cooperative and no distress  Respiratory: breathing non-labored at rest  Cardiovascular: regular rate and sinus rhythm  Gastrointestinal: soft, non-tender, and non-distended.  Percutaneous cholecystostomy in place with bilious output  Labs:     Latest Ref Rng & Units 02/23/2024    4:10 AM 02/22/2024    4:22 AM 02/21/2024    7:57 AM  CBC  WBC 4.0 - 10.5 K/uL 8.1  7.4  12.0   Hemoglobin 13.0 - 17.0 g/dL 78.2  95.6  21.3   Hematocrit 39.0 - 52.0 % 36.0  37.1  35.5   Platelets 150 - 400 K/uL 306  291  343       Latest Ref Rng & Units 02/23/2024    4:10 AM 02/22/2024    4:22 AM 02/21/2024    7:57 AM  CMP  Glucose 70 - 99 mg/dL 086  578  469   BUN 8 - 23 mg/dL 7  9  17    Creatinine 0.61 - 1.24 mg/dL 6.29  5.28  4.13   Sodium 135 - 145 mmol/L 137  141  142   Potassium 3.5 - 5.1 mmol/L 3.2  3.7  3.4   Chloride 98 - 111 mmol/L 106  114  114   CO2 22 - 32 mmol/L 22  21  20    Calcium  8.9 - 10.3 mg/dL 8.0  7.9  8.0   Total Protein 6.5 - 8.1 g/dL 5.9  5.7  6.1   Total Bilirubin 0.0 - 1.2 mg/dL 1.0  1.4  3.6   Alkaline Phos 38 - 126 U/L 189  218  254    AST 15 - 41 U/L 30  90  234   ALT 0 - 44 U/L 148  224  338     Imaging studies: Ultrasound-guided percutaneous cholecystostomy placement images were reviewed personally.  Cholecystostomy in place   Assessment/Plan:  73 y.o. male with acute cholecystitis   s/p percutaneous cholecystostomy, complicated by pertinent comorbidities including COPD history of PE on Eliquis , hypertension, diabetes.  -Patient with stable vital signs, no fever - Significant pain in the right upper quadrant still not completely controlled without IV pain medications - Percutaneous cholecystostomy in place - No leukocytosis - Liver enzyme and bilirubin trending down - Continue antibiotic therapy for treatment of cholecystitis.  Patient expected to respond to cholecystostomy drain and antibiotic therapy - Still in significant pain to be able to transition to oral pain medications - Address diet toleration today - Will continue to follow closely  Lucila Rye, MD

## 2024-02-24 DIAGNOSIS — K810 Acute cholecystitis: Secondary | ICD-10-CM | POA: Diagnosis not present

## 2024-02-24 LAB — COMPREHENSIVE METABOLIC PANEL WITH GFR
ALT: 102 U/L — ABNORMAL HIGH (ref 0–44)
AST: 19 U/L (ref 15–41)
Albumin: 2.5 g/dL — ABNORMAL LOW (ref 3.5–5.0)
Alkaline Phosphatase: 168 U/L — ABNORMAL HIGH (ref 38–126)
Anion gap: 11 (ref 5–15)
BUN: 7 mg/dL — ABNORMAL LOW (ref 8–23)
CO2: 20 mmol/L — ABNORMAL LOW (ref 22–32)
Calcium: 8.3 mg/dL — ABNORMAL LOW (ref 8.9–10.3)
Chloride: 106 mmol/L (ref 98–111)
Creatinine, Ser: 0.62 mg/dL (ref 0.61–1.24)
GFR, Estimated: >60 mL/min (ref >60)
Glucose, Bld: 135 mg/dL — ABNORMAL HIGH (ref 70–99)
Potassium: 3.7 mmol/L (ref 3.5–5.1)
Sodium: 137 mmol/L (ref 135–145)
Total Bilirubin: 0.9 mg/dL (ref 0.0–1.2)
Total Protein: 6.7 g/dL (ref 6.5–8.1)

## 2024-02-24 LAB — GLUCOSE, CAPILLARY
Glucose-Capillary: 144 mg/dL — ABNORMAL HIGH (ref 70–99)
Glucose-Capillary: 156 mg/dL — ABNORMAL HIGH (ref 70–99)
Glucose-Capillary: 145 mg/dL — ABNORMAL HIGH (ref 70–99)
Glucose-Capillary: 142 mg/dL — ABNORMAL HIGH (ref 70–99)

## 2024-02-24 MED ORDER — OXYCODONE HCL 5 MG PO TABS: 5.0000 mg | ORAL_TABLET | ORAL | 0 refills | Status: DC | PRN

## 2024-02-24 MED ORDER — KETOROLAC TROMETHAMINE 30 MG/ML IJ SOLN
30.0000 mg | Freq: Three times a day (TID) | INTRAMUSCULAR | Status: AC
  Administered 2024-02-24 – 2024-02-25 (×3): 30 mg via INTRAVENOUS
  Filled 2024-02-24 (×3): qty 1

## 2024-02-24 MED ORDER — ACETAMINOPHEN 325 MG PO TABS: 650.0000 mg | ORAL_TABLET | Freq: Four times a day (QID) | ORAL | Status: DC | PRN

## 2024-02-24 MED ORDER — AMOXICILLIN-POT CLAVULANATE 875-125 MG PO TABS
1.0000 | ORAL_TABLET | Freq: Two times a day (BID) | ORAL | 0 refills | Status: AC
Start: 2024-02-24 — End: 2024-03-05

## 2024-02-24 NOTE — Care Management Important Message (Signed)
 Important Message  Patient Details  Name: Douglas Smith MRN: [id number] Date of Birth: 1951-06-30   Important Message Given:  Yes - Medicare IM     Anise Kerns 02/24/2024, 1:35 PM

## 2024-02-24 NOTE — Progress Notes (Signed)
 Patient ID: Douglas Smith, male   DOB: 1951-07-03, 73 y.o.   MRN: [id number]     SURGICAL PROGRESS NOTE   Hospital Day(s): 3.   Interval History: Patient seen and examined, no acute events or new complaints overnight. Patient reports feeling significantly weak.  Endorses that he has significant pain in the right upper quadrant.  Pain exacerbated by movement of the abdominal wall.  Pain is alleviated by staying still.  He did tolerate some diet.  Passing gas.  Vital signs in last 24 hours: [min-max] current  Temp:  [97.7 F (36.5 C)-99.5 F (37.5 C)] 97.7 F (36.5 C) (05/04 0332) Pulse Rate:  [80-85] 80 (05/04 0332) Resp:  [18-20] 20 (05/04 0332) BP: (137-149)/(92-98) 138/92 (05/04 0332) SpO2:  [97 %-99 %] 97 % (05/04 0332)     Height: 6' (182.9 cm) Weight: 83.9 kg BMI (Calculated): 25.08   Physical Exam:  Constitutional: alert, cooperative and no distress  Respiratory: breathing non-labored at rest  Cardiovascular: regular rate and sinus rhythm  Gastrointestinal: soft, non-tender, and non-distended.  Percutaneous cholecystostomy in place with bilious output  Labs:     Latest Ref Rng & Units 02/23/2024    4:10 AM 02/22/2024    4:22 AM 02/21/2024    7:57 AM  CBC  WBC 4.0 - 10.5 K/uL 8.1  7.4  12.0   Hemoglobin 13.0 - 17.0 g/dL 27.2  53.6  64.4   Hematocrit 39.0 - 52.0 % 36.0  37.1  35.5   Platelets 150 - 400 K/uL 306  291  343       Latest Ref Rng & Units 02/23/2024    4:10 AM 02/22/2024    4:22 AM 02/21/2024    7:57 AM  CMP  Glucose 70 - 99 mg/dL 034  742  595   BUN 8 - 23 mg/dL 7  9  17    Creatinine 0.61 - 1.24 mg/dL 6.38  7.56  4.33   Sodium 135 - 145 mmol/L 137  141  142   Potassium 3.5 - 5.1 mmol/L 3.2  3.7  3.4   Chloride 98 - 111 mmol/L 106  114  114   CO2 22 - 32 mmol/L 22  21  20    Calcium  8.9 - 10.3 mg/dL 8.0  7.9  8.0   Total Protein 6.5 - 8.1 g/dL 5.9  5.7  6.1   Total Bilirubin 0.0 - 1.2 mg/dL 1.0  1.4  3.6   Alkaline Phos 38 - 126 U/L 189  218  254   AST 15 - 41  U/L 30  90  234   ALT 0 - 44 U/L 148  224  338     Imaging studies: No new pertinent imaging studies   Assessment/Plan:  73 y.o. male with acute cholecystitis s/p percutaneous cholecystostomy, complicated by pertinent comorbidities including COPD history of PE on Eliquis , hypertension, diabetes.   -Patient with stable vital signs, no fever -Still with significant pain in the right upper quadrant still not completely controlled without IV pain medications -He has unable to ambulate adequately due to pain -Since hemoglobin is stable and adequate renal function, will add Toradol  for 3 doses to see if this helps to control pain to be able to get the patient out of bed. -Consider PT/OT consult if patient continue progressive weakness. - Percutaneous cholecystostomy in place - No leukocytosis - Liver enzyme and bilirubin trending down - Continue antibiotic therapy for treatment of cholecystitis.  Patient expected to respond to cholecystostomy drain and  antibiotic therapy - Still in significant pain to be able to transition to oral pain medications - Continue current diet.  Lucila Rye, MD

## 2024-02-24 NOTE — TOC Progression Note (Signed)
 Transition of Care [facility]) - Progression Note    Patient Details  Name: Douglas Smith MRN: [id number] Date of Birth: August 30, 1951  Transition of Care [facility]) CM/SW Contact  Marino Sias, RN Phone Number: 02/24/2024, 12:27 PM  Clinical Narrative:  Deveron Fly order through [employer] for delivery to room for discharge.         Expected Discharge Plan and Services                                               Social Determinants of Health (SDOH) Interventions SDOH Screenings   Food Insecurity: Patient Declined (02/21/2024)  Housing: Patient Declined (02/21/2024)  Transportation Needs: Patient Declined (02/21/2024)  Utilities: Patient Declined (02/21/2024)  Social Connections: Patient Declined (02/21/2024)  Tobacco Use: Medium Risk (02/20/2024)    Readmission Risk Interventions     No data to display

## 2024-02-24 NOTE — Discharge Summary (Signed)
 Physician Discharge Summary   Patient: Douglas Smith MRN: [id number] DOB: May 27, 1951  Admit date:     02/21/2024  Discharge date: 02/24/24  Discharge Physician: Montey Apa   PCP: [facility]   Recommendations at discharge:   Follow up with Interventional Radiology - their office to schedule appt Follow up with General Surgery as needed if surgery is a future consideration Follow up with Primary Care in 1-2 weeks Obtain CBC, CMP in 1-2 weeks DRAIN CARE -- Flush twice daily.  Do not submerge.  Routine dressings and wound care.  Home health RN ordered to assist. Follow pending bile culture to final.   Patient discharged on Augmentin to complete total of 14 days antibiotics including IV Cefepime /Flagyl  during admission   Discharge Diagnoses: Principal Problem:   Acute cholecystitis Active Problems:   Acute sepsis (HCC)  Resolved Problems:   * No resolved hospital problems. *  Hospital Course:  "Douglas Smith is a 73 y.o. male with medical history significant of PE/DVT on Eliquis , HTN, COPD, IIDM, presented with recurrent epigastric pain.   Patient started to have intermittent epigastric pain about 3 to 4 weeks ago, came to ED 1 time 2 weeks ago, CT scan did not show significant findings other than cholelithiasis, and patient was given GI cocktail and symptoms improved and patient was reassured and sent home.  Last night, patient started to have same symptoms, with cramping-like epigastric pain, 10/10, associated with nausea but no vomiting no diarrhea no fever or chills.  Denies any cough no chest pains.   ED Course: Temperature 99.0, not tachycardia nonhypotensive not hypoxic.  Lab work showed WBC 16.8 K3.2 AST 214 ALT 134, bilirubin 1.2 lipase within normal limits.  RUQ ultrasound showed cholelithiasis with suspected tumefactive sludge associated with irregular gallbladder wall thickening and positive sonographic Murphy sign concerning about acute cholecystitis."    Patient was started on cefepime  and Flagyl  and admitted for further evaluation and management as outlined below.        Assessment and Plan:   Acute cholecystitis Status post percutaneous cholecystostomy by IR on 02/22/24 Flush drain twice daily / Do no submerge drain or site  Home health RN ordered for assistance w drain management  Treated with IV Cefepime  and Flagyl  during admission Discharge on 10 days PO Augmentin Follow bile culture to final (growing E coli)  General surgery consulted and recommended non-surgical management with percutaneous biliary drain, given the history of PE on Eliquis  and acute UTI.   Follow up outpatient with Surgery if cholecystectomy can be considered in the future  Pain control and anti-emetics as needed Patient tolerating diet.  Repeat CBC, CMP in 1-2 weeks    Hyperchloremic metabolic acidosis Due to high volume NS fluids on admission --Diet resumed today post-procedure --Stop IV fluids --Repeat CMP at follow up   Acute Transaminitis - Likely related to acute cholecystitis. --Trend LFT's --Holding statin - resume at d/c with improved LFT's   Hypokalemia - replaced and resolved. --Repeat labs in 1-2 weeks   UTI -- pan-sensitive E coli urine culture --ABX as above   DVT and PE --Heparin  drip until after IR procedure --Continue Eliquis    HTN --Continue HCTZ and losartan    HLD --Holding statin - resume at d/c   Former smoker -- d/c nicotine  patch. Patient successfully stopped smoking after diagnosis of blood clots.       Consultants: IR, surgery Procedures performed: percutaneous cholecystostomy  Disposition: Home health Diet recommendation:  Soft diet (low fiber)  DISCHARGE MEDICATION: Allergies as of 02/24/2024   No Known Allergies      Medication List     STOP taking these medications    nicotine  21 mg/24hr patch Commonly known as: NICODERM CQ  - dosed in mg/24 hours       TAKE these medications     acetaminophen  325 MG tablet Commonly known as: TYLENOL  Take 2 tablets (650 mg total) by mouth every 6 (six) hours as needed for mild pain (pain score 1-3) (or Fever >/= 101).   albuterol  108 (90 Base) MCG/ACT inhaler Commonly known as: VENTOLIN  HFA Inhale 2 puffs into the lungs every 4 (four) hours as needed for wheezing or shortness of breath.   albuterol  (2.5 MG/3ML) 0.083% nebulizer solution Commonly known as: PROVENTIL  Take 3 mLs (2.5 mg total) by nebulization every 4 (four) hours as needed for wheezing or shortness of breath.   amoxicillin-clavulanate 875-125 MG tablet Commonly known as: AUGMENTIN Take 1 tablet by mouth 2 (two) times daily for 10 days.   apixaban  5 MG Tabs tablet Commonly known as: ELIQUIS  Take 1 tablet (5 mg total) by mouth 2 (two) times daily.   aspirin  EC 81 MG tablet Take 81 mg by mouth daily. Swallow whole.   atorvastatin  20 MG tablet Commonly known as: LIPITOR Take 20 mg by mouth daily.   Compressor/Nebulizer Misc 1 Units by Does not apply route every 4 (four) hours as needed.   empagliflozin  25 MG Tabs tablet Commonly known as: JARDIANCE  Take 12.5 mg by mouth daily.   losartan  50 MG tablet Commonly known as: COZAAR  Take 25 mg by mouth daily.   naproxen sodium 220 MG tablet Commonly known as: ALEVE Take 220 mg by mouth 2 (two) times daily as needed (Pain).   omeprazole  20 MG capsule Commonly known as: PRILOSEC  Take 20 mg by mouth daily.   oxyCODONE  5 MG immediate release tablet Commonly known as: Oxy IR/ROXICODONE  Take 1 tablet (5 mg total) by mouth every 4 (four) hours as needed for moderate pain (pain score 4-6).   PreserVision AREDS Tabs Take 1 tablet by mouth 2 (two) times daily.               Durable Medical Equipment  (From admission, onward)           Start     Ordered   02/24/24 1224  For home use only DME Walker rolling  Once       Question Answer Comment  Walker: With 5 Inch Wheels   Patient needs a  walker to treat with the following condition Unsteady gait      02/24/24 1223            Follow-up Information     Douglas Hylan, MD. Schedule an appointment as soon as possible for a visit in 2 week(s).   Specialty: General Surgery Contact information: [address] [place] [address] [place] [address] [phone]                Discharge Exam: Douglas Smith Weights   02/20/24 1811  Weight: 83.9 kg   General exam: awake, alert, no acute distress HEENT: atraumatic, clear conjunctiva, anicteric sclera, moist mucus membranes, hearing grossly normal  Respiratory system: CTAB, no wheezes, rales or rhonchi, normal respiratory effort. Cardiovascular system: normal S1/S2,  RRR, no JVD, murmurs, rubs, gallops, no pedal edema.   Gastrointestinal system: biliary drain in place with dark brown fluid in tubing and bag, soft, non-tender, non-distended Central nervous system: A&O x4. no gross  focal neurologic deficits, normal speech Extremities: moves all , no edema, normal tone Skin: dry, intact, normal temperature Psychiatry: normal mood, congruent affect, judgement and insight appear normal   Condition at discharge: stable  The results of significant diagnostics from this hospitalization (including imaging, microbiology, ancillary and laboratory) are listed below for reference.   Imaging Studies: IR Perc Cholecystostomy Result Date: 02/22/2024 INDICATION: 73 year old male with a history acute cholecystitis EXAM: CHOLECYSTOSTOMY MEDICATIONS: None. ANESTHESIA/SEDATION: Moderate (conscious) sedation was employed during this procedure. A total of Versed  2.0 mg and Fentanyl  100 mcg was administered intravenously. Moderate Sedation Time: 10 minutes. The patient's level of consciousness and vital signs were monitored continuously by radiology nursing throughout the procedure under my direct supervision. FLUOROSCOPY TIME:  Fluoroscopy Time:   (2 mGy). COMPLICATIONS: None PROCEDURE: Informed  written consent was obtained from the patient and the patient's family after a thorough discussion of the procedural risks, benefits and alternatives. All questions were addressed. Maximal Sterile Barrier Technique was utilized including caps, mask, sterile gowns, sterile gloves, sterile drape, hand hygiene and skin antiseptic. A timeout was performed prior to the initiation of the procedure. Ultrasound survey of the right upper quadrant was performed for planning purposes. Once the patient is prepped and draped in the usual sterile fashion, the skin and subcutaneous tissues overlying the gallbladder were generously infiltrated 1% lidocaine  for local anesthesia. A coaxial needle was advanced under ultrasound guidance through the skin subcutaneous tissues and a small segment of liver into the gallbladder lumen. With removal of the stylet, spontaneous dark bile drainage occurred. Using modified Seldinger technique, a 10 French drain was placed into the gallbladder fossa, with aspiration of the sample for the lab. Contrast injection confirmed position of the tube within the gallbladder lumen. Drainage catheter was attached to gravity drain with a suture retention placed. Patient tolerated the procedure well and remained hemodynamically stable throughout. No complications were encountered and no significant blood loss encountered. IMPRESSION: Status post image guided percutaneous cholecystostomy Signed, Marciano Settles. Rexine Cater, RPVI [facility] Electronically Signed   By: Myrlene Asper D.O.   On: 02/22/2024 11:18   CT ABDOMEN PELVIS W CONTRAST Result Date: 02/21/2024 CLINICAL DATA:  Gallstones with gallbladder wall thickening on ultrasound. Clinical concern for acute cholecystitis. EXAM: CT ABDOMEN AND PELVIS WITH CONTRAST TECHNIQUE: Multidetector CT imaging of the abdomen and pelvis was performed using the standard protocol following bolus administration of  intravenous contrast. RADIATION DOSE REDUCTION: This exam was performed according to the departmental dose-optimization program which includes automated exposure control, adjustment of the mA and/or kV according to patient size and/or use of iterative reconstruction technique. CONTRAST:  OMNIPAQUE  IOHEXOL  300 MG/ML  SOLN COMPARISON:  Ultrasound exam earlier same day.  CT scan 02/02/2024. FINDINGS: Lower chest: Right lower lobe collapse/consolidation with some probable minimal atelectasis in the left base. 4 mm left upper lobe nodule on 12/04. 5 mm left lower lobe nodule on 17/4. Both of these appear new in the interval. Several additional scattered tiny nodules are seen in the right middle and left lower lobe. No pleural effusion. Hepatobiliary: Heterogeneous liver perfusion with apparent hyperemia around the gallbladder fossa. Gallbladder wall is thickened irregular. Coronal imaging suggests the presence of a mucosal defect (36/5) with dissection into the perinephric space of the gallbladder fossa (coronal 40/5). Possible mural defect towards the fundus cranially on 30/5 with potential tiny gas bubble on the same image also seen on 34/6 of the sagittal series. High density  material in the lumen of the gallbladder may be sludge or blood. Calcified gallstone seen towards the neck of the gallbladder on coronal 49/5. No intrahepatic or extrahepatic biliary dilation. Pancreas: No focal mass lesion. No dilatation of the main duct. No intraparenchymal cyst. No peripancreatic edema. Spleen: No splenomegaly. No suspicious focal mass lesion. Adrenals/Urinary Tract: No adrenal nodule or mass. 3.7 cm simple cyst noted upper pole right kidney. Tiny well-defined homogeneous low-density lesions in both kidneys are too small to characterize but are statistically most likely benign and probably cysts. No followup imaging is recommended. No evidence for hydroureter. The urinary bladder appears normal for the degree of  distention. Stomach/Bowel: Stomach is unremarkable. No gastric wall thickening. No evidence of outlet obstruction. Mild wall thickening proximal descending duodenum with adjacent Peri duodenal edema/fluid tracking down from the region of the gallbladder fossa. No small bowel wall thickening. No small bowel dilatation. The terminal ileum is normal. The appendix is normal. No gross colonic mass. No colonic wall thickening. Diverticular changes are noted in the left colon without evidence of diverticulitis. Vascular/Lymphatic: There is mild atherosclerotic calcification of the abdominal aorta without aneurysm. There is no gastrohepatic or hepatoduodenal ligament lymphadenopathy. No retroperitoneal or mesenteric lymphadenopathy. No pelvic sidewall lymphadenopathy. Reproductive: Prostate gland is mildly enlarged. Other: No intraperitoneal free fluid. Musculoskeletal: No worrisome lytic or sclerotic osseous abnormality. IMPRESSION: 1. Gallbladder wall is thickened and irregular with high density material in the lumen of the gallbladder which may be sludge or blood. Calcified gallstone towards the neck of the gallbladder. Coronal imaging suggests the presence of a mucosal defect with dissection into the perinephric space of the gallbladder fossa, findings concerning for mural perforation and evolving pericholecystic abscess. Possible second mural defect towards the fundus cranially with potential tiny gas bubble. Imaging features are compatible with acute cholecystitis with gallbladder perforation. 2. Heterogeneous liver perfusion with apparent hyperemia around the gallbladder fossa. This is likely reactive related to the gallbladder process. 3. Mild wall thickening proximal descending duodenum with adjacent periduodenal edema/fluid tracking down from the region of the gallbladder fossa. These changes are felt to be secondary. 4. Right lower lobe collapse/consolidation with some probable minimal atelectasis in the left  base. 5. Multiple pulmonary nodules. Most severe: 5 mm left solid pulmonary nodule. No follow-up needed if patient is low-risk (and has no known or suspected primary neoplasm). Non-contrast chest CT can be considered in 12 months if patient is high-risk. This recommendation follows the consensus statement: Guidelines for Management of Incidental Pulmonary Nodules Detected on CT Images: From the Fleischner Society 2017; Radiology 2017; 284:228-243. 6.  Aortic Atherosclerosis (ICD10-I70.0). Electronically Signed   By: Donnal Fusi M.D.   On: 02/21/2024 10:05   US  ABDOMEN LIMITED RUQ (LIVER/GB) Result Date: 02/21/2024 EXAM: Right Upper Quadrant Abdominal Ultrasound 02/21/2024 01:11:00 AM TECHNIQUE: Real-time ultrasonography of the right upper quadrant of the abdomen was performed. COMPARISON: CT abdomen/pelvis dated 02/02/2024. CLINICAL HISTORY: Upper abdominal pain. FINDINGS: LIVER: Hyperechoic hepatic parenchyma, suggesting hepatic steatosis. No evidence of intrahepatic biliary ductal dilatation. No evidence of mass. BILIARY SYSTEM: Mild irregular gallbladder wall thickening and positive sonographic Murphy's sign. Abnormal soft tissue in the gallbladder lumen, not clearly evident on prior CT, possibly reflecting tumefactive sludge. Known tiny layering gallstones, better visualized on CT. Common bile duct is within normal limits measuring . RIGHT KIDNEY: The right kidney is grossly unremarkable without evidence of hydronephrosis. PANCREAS: Visualized portions of the pancreas are unremarkable. OTHER: No evidence of right upper quadrant ascites. IMPRESSION: 1. Cholelithiasis with  suspected tumefactive sludge. Associated irregular gallbladder wall thickening and positive sonographic Murphy's sign, equivocal but raising concern for acute cholecystitis. 2. Hepatic steatosis. Electronically signed by: Zadie Herter MD 02/21/2024 01:29 AM EDT RP Workstation: [id number]   DG Chest Portable 1 View Result Date:  02/21/2024 CLINICAL DATA:  Sepsis EXAM: PORTABLE CHEST 1 VIEW COMPARISON:  11/17/2022 FINDINGS: Mild interval decrease in pulmonary insufflation. Ovoid density at the right lung base appears new from prior CT abdomen pelvis of 02/02/2024 and likely represents an area discoid atelectasis. Lungs are otherwise clear. No pneumothorax or pleural effusion. Cardiac size within normal limits. No acute bone abnormality. IMPRESSION: No active disease. Electronically Signed   By: Worthy Heads M.D.   On: 02/21/2024 01:25   CT ABDOMEN PELVIS W CONTRAST Result Date: 02/02/2024 CLINICAL DATA:  Epigastric pain EXAM: CT ABDOMEN AND PELVIS WITH CONTRAST TECHNIQUE: Multidetector CT imaging of the abdomen and pelvis was performed using the standard protocol following bolus administration of intravenous contrast. RADIATION DOSE REDUCTION: This exam was performed according to the departmental dose-optimization program which includes automated exposure control, adjustment of the mA and/or kV according to patient size and/or use of iterative reconstruction technique. CONTRAST:  OMNIPAQUE  IOHEXOL  300 MG/ML  SOLN COMPARISON:  None Available. FINDINGS: Lower chest: Dependent bibasilar subsegmental atelectasis or scarring. No pleural or pericardial effusions. Hepatobiliary: No biliary ductal dilatation. No hepatic parenchymal abnormalities. Tiny cysts noncalcified gallstones. No pericholecystic inflammatory changes. Pancreas: Unremarkable. No pancreatic ductal dilatation or surrounding inflammatory changes. Spleen: Normal in size without focal abnormality. Adrenals/Urinary Tract: Adrenal glands are unremarkable. Bilateral renal cysts identified the largest 3.7 cm on the right. No nephrolithiasis or hydronephrosis. Stomach/Bowel: Stomach is within normal limits. Appendix appears normal. No evidence of bowel wall thickening, distention, or inflammatory changes. Diverticulosis descending and sigmoid. Vascular/Lymphatic: Aortic  atherosclerosis. No enlarged abdominal or pelvic lymph nodes. Reproductive: Prominent prostate, 5.6 x 4.4 x 4.4 cm. Other: No abdominal wall hernia or abnormality. No abdominopelvic ascites. Musculoskeletal: No acute or significant osseous findings. IMPRESSION: 1. Cholelithiasis. 2. Renal cysts. 3. Diverticulosis. 4. Prostatomegaly. 5. Aortic atherosclerosis. Electronically Signed   By: Sydell Eva M.D.   On: 02/02/2024 19:34    Microbiology: Results for orders placed or performed during the hospital encounter of 02/21/24  Urine Culture     Status: Abnormal   Collection Time: 02/21/24  1:06 AM   Specimen: Urine, Random  Result Value Ref Range Status   Specimen Description   Final    URINE, RANDOM Performed at [facility], [address], [place], [place] [address]    Special Requests   Final    NONE Performed at [facility], [address] [place], [place] [address]    Culture >=100,000 COLONIES/mL ESCHERICHIA COLI (A)  Final   Report Status 02/23/2024 FINAL  Final   Organism ID, Bacteria ESCHERICHIA COLI (A)  Final      Susceptibility   Escherichia coli - MIC*    AMPICILLIN 8 SENSITIVE Sensitive     CEFAZOLIN <=4 SENSITIVE Sensitive     CEFEPIME  <=0.12 SENSITIVE Sensitive     CEFTRIAXONE  <=0.25 SENSITIVE Sensitive     CIPROFLOXACIN <=0.25 SENSITIVE Sensitive     GENTAMICIN <=1 SENSITIVE Sensitive     IMIPENEM <=0.25 SENSITIVE Sensitive     NITROFURANTOIN <=16 SENSITIVE Sensitive     TRIMETH/SULFA <=20 SENSITIVE Sensitive     AMPICILLIN/SULBACTAM <=2 SENSITIVE Sensitive     PIP/TAZO <=4 SENSITIVE Sensitive ug/mL    * >=100,000 COLONIES/mL ESCHERICHIA COLI  Blood culture (routine x 2)     Status: None (Preliminary result)   Collection Time: 02/21/24  1:27 AM   Specimen: BLOOD  Result Value Ref Range Status   Specimen Description BLOOD LEFT ASSIST CONTROL  Final   Special Requests   Final    BOTTLES DRAWN AEROBIC AND ANAEROBIC Blood Culture adequate  volume   Culture   Final    NO GROWTH 3 DAYS Performed at [facility], [address] [place], [place] [address]    Report Status PENDING  Incomplete  Blood culture (routine x 2)     Status: None (Preliminary result)   Collection Time: 02/21/24  1:27 AM   Specimen: BLOOD  Result Value Ref Range Status   Specimen Description BLOOD LEFT FOREARM  Final   Special Requests   Final    BOTTLES DRAWN AEROBIC AND ANAEROBIC Blood Culture adequate volume   Culture   Final    NO GROWTH 3 DAYS Performed at [facility], [address], [place], [place] [address]    Report Status PENDING  Incomplete  Aerobic/Anaerobic Culture w Gram Stain (surgical/deep wound)     Status: None (Preliminary result)   Collection Time: 02/22/24 11:02 AM   Specimen: BILE  Result Value Ref Range Status   Specimen Description   Final    BILE Performed at [facility], [address], [place], [place] [address]    Special Requests   Final    NONE Performed at [facility], [address] [place], [place] [address]    Gram Stain   Final    MODERATE WBC PRESENT, PREDOMINANTLY PMN NO ORGANISMS SEEN Performed at [facility], [address], [place], [place] [address]    Culture   Final    ABUNDANT ESCHERICHIA COLI SUSCEPTIBILITIES TO FOLLOW NO ANAEROBES ISOLATED; CULTURE IN PROGRESS FOR 5 DAYS    Report Status PENDING  Incomplete    Labs: CBC: Recent Labs  Lab 02/20/24 1812 02/21/24 0757 02/22/24 0422 02/23/24 0410  WBC 16.8* 12.0* 7.4 8.1  HGB 13.8 11.8* 11.5* 11.7*  HCT 43.5 35.5* 37.1* 36.0*  MCV 83.0 80.3 82.1 81.4  PLT 423* 343 291 306   Basic Metabolic Panel: Recent Labs  Lab 02/20/24 1812 02/21/24 0757 02/22/24 0422 02/23/24 0410 02/24/24 0722  NA 140 142 141 137 137  K 3.2* 3.4* 3.7 3.2* 3.7  CL 110 114* 114* 106 106  CO2 22 20* 21* 22 20*  GLUCOSE 118* 104* 107* 131* 135*  BUN 15 17 9  7* 7*  CREATININE 0.85 0.81 0.86 0.74 0.62   CALCIUM  8.5* 8.0* 7.9* 8.0* 8.3*  MG  --   --   --  2.0  --    Liver Function Tests: Recent Labs  Lab 02/20/24 1812 02/21/24 0757 02/22/24 0422 02/23/24 0410 02/24/24 0722  AST 214* 234* 90* 30 19  ALT 134* 338* 224* 148* 102*  ALKPHOS 262* 254* 218* 189* 168*  BILITOT 1.2 3.6* 1.4* 1.0 0.9  PROT 7.3 6.1* 5.7* 5.9* 6.7  ALBUMIN 3.0* 2.5* 2.2* 2.3* 2.5*   CBG: Recent Labs  Lab 02/23/24 1656 02/23/24 2019 02/24/24 0846 02/24/24 1139 02/24/24 1637  GLUCAP 133* 119* 144* 156* 145*    Discharge time spent: greater than 30 minutes.  Signed: Montey Apa, DO [facility] 02/24/2024

## 2024-02-24 NOTE — TOC Transition Note (Addendum)
 Transition of Care [facility]) - Discharge Note   Patient Details  Name: Douglas Smith MRN: [id number] Date of Birth: [birth year]  [facility] CM/SW Contact:  Marino Sias, RN Phone Number: 02/24/2024, 2:39 PM   Clinical Narrative:   [facility] will provide RN services however may take up to 5 days due to the [place] Authorization process, MD notified. 5:16 pm Spoke with patient and family at bedside, they decided to appeal discharge due to [facility] not being able to start within 3 days and feel that patient may be at risk for infections from drainage tube. Message left for [facility] to call patient to discuss Insurance coverage for sooner start of care date. Patient also given [facility] contact information.    Final next level of care: Home w Home Health Services Barriers to Discharge: Barriers Resolved   Patient Goals and CMS Choice Patient states their goals for this hospitalization and ongoing recovery are:: To return home.   Choice offered to / list presented to : NA      Discharge Placement                    Patient and family notified of of transfer: 02/24/24  Discharge Plan and Services Additional resources added to the After Visit Summary for                  DME Arranged: Otho Blitz DME Agency: [employer] Date DME Agency Contacted: 02/24/24   Representative spoke with at DME Agency: [facility] HH Arranged: RN HH Agency: [facility] Date HH Agency Contacted: 02/24/24   Representative spoke with at [facility] Agency: Shaun  Social Drivers of Health (SDOH) Interventions SDOH Screenings   Food Insecurity: Patient Declined (02/21/2024)  Housing: Patient Declined (02/21/2024)  Transportation Needs: Patient Declined (02/21/2024)  Utilities: Patient Declined (02/21/2024)  Social Connections: Patient Declined (02/21/2024)  Tobacco Use: Medium Risk (02/20/2024)     Readmission Risk Interventions     No data to display

## 2024-02-24 NOTE — Plan of Care (Signed)
   Problem: Fluid Volume: Goal: Hemodynamic stability will improve Outcome: Progressing

## 2024-02-25 ENCOUNTER — Other Ambulatory Visit: Payer: Self-pay

## 2024-02-25 DIAGNOSIS — K810 Acute cholecystitis: Secondary | ICD-10-CM | POA: Diagnosis not present

## 2024-02-25 LAB — GLUCOSE, CAPILLARY
Glucose-Capillary: 124 mg/dL — ABNORMAL HIGH (ref 70–99)
Glucose-Capillary: 124 mg/dL — ABNORMAL HIGH (ref 70–99)
Glucose-Capillary: 130 mg/dL — ABNORMAL HIGH (ref 70–99)

## 2024-02-25 MED ORDER — SODIUM CHLORIDE FLUSH 0.9 % IV SOLN
INTRAVENOUS | 3 refills | Status: DC
  Filled 2024-02-25: qty 300, 60d supply, fill #0

## 2024-02-25 NOTE — Progress Notes (Signed)
 Patient ID: Douglas Smith, male   DOB: 1951-04-05, 73 y.o.   MRN: [id number]    Referring Physician(s): Flynn Hylan, MD   Supervising Physician: Fernando Hoyer  Patient Status:  [facility] - In-pt  Chief Complaint:  Acute cholecystitis; s/p percutaneous cholecystostomy 02/22/24  Subjective:  Pt is day 3 post percutaneous cholecystostomy placement. Reports feeling better - has some pain focally surrounding drain site with movement but is pain free at rest. Tolerating po intake - just finished lunch before I walked into room. Reports having 2 Bms today and otherwise feeling well.  Allergies: Patient has no known allergies.  Medications: Prior to Admission medications   Medication Sig Start Date End Date Taking? Authorizing Provider  albuterol  (PROVENTIL ) (2.5 MG/3ML) 0.083% nebulizer solution Take 3 mLs (2.5 mg total) by nebulization every 4 (four) hours as needed for wheezing or shortness of breath. 10/12/21  Yes Norlene Beavers, MD  albuterol  (VENTOLIN  HFA) 108 (90 Base) MCG/ACT inhaler Inhale 2 puffs into the lungs every 4 (four) hours as needed for wheezing or shortness of breath. 10/12/21  Yes Sung, Jade J, MD  amoxicillin-clavulanate (AUGMENTIN) 875-125 MG tablet Take 1 tablet by mouth 2 (two) times daily for 10 days. 02/24/24 03/05/24 Yes Montey Apa, DO  apixaban  (ELIQUIS ) 5 MG TABS tablet Take 1 tablet (5 mg total) by mouth 2 (two) times daily. 04/28/21 02/21/24 Yes MasoudAlisa App, MD  aspirin  EC 81 MG tablet Take 81 mg by mouth daily. Swallow whole.   Yes [provider]  atorvastatin  (LIPITOR) 20 MG tablet Take 20 mg by mouth daily. 01/10/24  Yes [provider]  empagliflozin  (JARDIANCE ) 25 MG TABS tablet Take 12.5 mg by mouth daily. 08/15/23  Yes [provider]  losartan  (COZAAR ) 50 MG tablet Take 25 mg by mouth daily. 08/15/23  Yes [provider]  Multiple Vitamins-Minerals (PRESERVISION AREDS) TABS Take 1 tablet by mouth 2 (two) times  daily.   Yes [provider]  naproxen sodium (ALEVE) 220 MG tablet Take 220 mg by mouth 2 (two) times daily as needed (Pain).   Yes [provider]  nicotine  (NICODERM CQ  - DOSED IN MG/24 HOURS) 21 mg/24hr patch Place 1 patch (21 mg total) onto the skin daily. 04/15/21  Yes Althia Atlas, MD  omeprazole  (PRILOSEC ) 20 MG capsule Take 20 mg by mouth daily.   Yes [provider]  acetaminophen  (TYLENOL ) 325 MG tablet Take 2 tablets (650 mg total) by mouth every 6 (six) hours as needed for mild pain (pain score 1-3) (or Fever >/= 101). 02/24/24   Montey Apa, DO  Nebulizers (COMPRESSOR/NEBULIZER) MISC 1 Units by Does not apply route every 4 (four) hours as needed. 10/12/21   Sung, Jade J, MD  oxyCODONE  (OXY IR/ROXICODONE ) 5 MG immediate release tablet Take 1 tablet (5 mg total) by mouth every 4 (four) hours as needed for moderate pain (pain score 4-6). 02/24/24   Montey Apa, DO     Vital Signs: BP (!) 143/92 (BP Location: Right Arm)   Pulse 75   Temp 98.4 F (36.9 C) (Oral)   Resp 17   Ht 6' (1.829 m)   Wt 185 lb (83.9 kg)   SpO2 98%   BMI 25.09 kg/m   Physical Exam Vitals and nursing note reviewed.  Constitutional:      Comments: Pt appears well. Appears improved from initial evaluation in the ED   HENT:     Mouth/Throat:     Mouth: Mucous membranes are  moist.     Pharynx: Oropharynx is clear.  Cardiovascular:     Rate and Rhythm: Normal rate and regular rhythm.  Pulmonary:     Effort: Pulmonary effort is normal.     Breath sounds: Normal breath sounds.  Abdominal:     Palpations: Abdomen is soft.     Comments: + RUQ chole drain. Appears well. Focal ttp surrounding drain site and with movement. No overlying abnormality. Flushes without difficulty or pain at bedside.  Musculoskeletal:     Right lower leg: No edema.     Left lower leg: No edema.  Skin:    General: Skin is warm and dry.  Neurological:     Mental Status: He is alert and  oriented to person, place, and time. Mental status is at baseline.     Imaging: IR Perc Cholecystostomy Result Date: 02/22/2024 INDICATION: 73 year old male with a history acute cholecystitis EXAM: CHOLECYSTOSTOMY MEDICATIONS: None. ANESTHESIA/SEDATION: Moderate (conscious) sedation was employed during this procedure. A total of Versed  2.0 mg and Fentanyl  100 mcg was administered intravenously. Moderate Sedation Time: 10 minutes. The patient's level of consciousness and vital signs were monitored continuously by radiology nursing throughout the procedure under my direct supervision. FLUOROSCOPY TIME:  Fluoroscopy Time:   (2 mGy). COMPLICATIONS: None PROCEDURE: Informed written consent was obtained from the patient and the patient's family after a thorough discussion of the procedural risks, benefits and alternatives. All questions were addressed. Maximal Sterile Barrier Technique was utilized including caps, mask, sterile gowns, sterile gloves, sterile drape, hand hygiene and skin antiseptic. A timeout was performed prior to the initiation of the procedure. Ultrasound survey of the right upper quadrant was performed for planning purposes. Once the patient is prepped and draped in the usual sterile fashion, the skin and subcutaneous tissues overlying the gallbladder were generously infiltrated 1% lidocaine  for local anesthesia. A coaxial needle was advanced under ultrasound guidance through the skin subcutaneous tissues and a small segment of liver into the gallbladder lumen. With removal of the stylet, spontaneous dark bile drainage occurred. Using modified Seldinger technique, a 10 French drain was placed into the gallbladder fossa, with aspiration of the sample for the lab. Contrast injection confirmed position of the tube within the gallbladder lumen. Drainage catheter was attached to gravity drain with a suture retention placed. Patient tolerated the procedure well and remained hemodynamically stable  throughout. No complications were encountered and no significant blood loss encountered. IMPRESSION: Status post image guided percutaneous cholecystostomy Signed, Marciano Settles. Rexine Cater, RPVI [facility] Electronically Signed   By: Myrlene Asper D.O.   On: 02/22/2024 11:18    Labs:  CBC: Recent Labs    02/20/24 1812 02/21/24 0757 02/22/24 0422 02/23/24 0410  WBC 16.8* 12.0* 7.4 8.1  HGB 13.8 11.8* 11.5* 11.7*  HCT 43.5 35.5* 37.1* 36.0*  PLT 423* 343 291 306    COAGS: Recent Labs    02/21/24 0757 02/21/24 1925 02/22/24 0849  INR 1.4*  --  1.2  APTT 35 104*  --     BMP: Recent Labs    02/21/24 0757 02/22/24 0422 02/23/24 0410 02/24/24 0722  NA 142 141 137 137  K 3.4* 3.7 3.2* 3.7  CL 114* 114* 106 106  CO2 20* 21* 22 20*  GLUCOSE 104* 107* 131* 135*  BUN 17 9 7* 7*  CALCIUM  8.0* 7.9* 8.0* 8.3*  CREATININE 0.81 0.86 0.74 0.62  GFRNONAA >60 >60 >60 >60    LIVER FUNCTION  TESTS: Recent Labs    02/21/24 0757 02/22/24 0422 02/23/24 0410 02/24/24 0722  BILITOT 3.6* 1.4* 1.0 0.9  AST 234* 90* 30 19  ALT 338* 224* 148* 102*  ALKPHOS 254* 218* 189* 168*  PROT 6.1* 5.7* 5.9* 6.7  ALBUMIN 2.5* 2.2* 2.3* 2.5*    Assessment and Plan:  Day 3 post percutaneous cholecystostomy placement - general surgery following closely. Per their note, pt stable for dc and I would agree with this, pt looks well. Drain with 120cc output over last 24h. WBC now wnl. - general surgery planning to see pt outpatient to discuss further care, pt to continue oral abx per gen surg - Will place 6 week drain follow up for IR today along with flushes for drain, this follow up will be pending general surgery assessment outpt and if they want to remove gallbladder  Percutaneous cholecystostomy drain to remain in place at least 6 weeks.   Recommend fluoroscopy with injection of the drain in IR to evaluate for patency of the cystic  duct.  If the duct is patent and general surgery feels patient is stable for cholecystectomy, the drain would be removed at time of surgery.  If the duct is patent and general surgery feels patient is NEVER a candidate for cholecystectomy, drain can be capped for a trial.  If symptoms recur, then place to gravity bag again.  If trial is successful, discuss possible removal of the drain.  If trial in unsuccessful, then patient will need routine exchanges of the  chole tube about every 8-10 weeks.   Electronically Signed: Nicolasa Barrett, PA-C 02/25/2024, 1:22 PM   I spent a total of 15 Minutes at the the patient's bedside AND on the patient's hospital floor or unit, greater than 50% of which was counseling/coordinating care for percutaneous cholecystostomy follow up

## 2024-02-25 NOTE — Plan of Care (Signed)
  Problem: Fluid Volume: Goal: Hemodynamic stability will improve Outcome: Adequate for Discharge   Problem: Clinical Measurements: Goal: Diagnostic test results will improve Outcome: Adequate for Discharge Goal: Signs and symptoms of infection will decrease Outcome: Adequate for Discharge   Problem: Respiratory: Goal: Ability to maintain adequate ventilation will improve Outcome: Adequate for Discharge   Problem: Education: Goal: Ability to describe self-care measures that may prevent or decrease complications (Diabetes Survival Skills Education) will improve Outcome: Adequate for Discharge Goal: Individualized Educational Video(s) Outcome: Adequate for Discharge   Problem: Coping: Goal: Ability to adjust to condition or change in health will improve Outcome: Adequate for Discharge   Problem: Fluid Volume: Goal: Ability to maintain a balanced intake and output will improve Outcome: Adequate for Discharge   Problem: Health Behavior/Discharge Planning: Goal: Ability to identify and utilize available resources and services will improve Outcome: Adequate for Discharge Goal: Ability to manage health-related needs will improve Outcome: Adequate for Discharge   Problem: Metabolic: Goal: Ability to maintain appropriate glucose levels will improve Outcome: Adequate for Discharge   Problem: Nutritional: Goal: Maintenance of adequate nutrition will improve Outcome: Adequate for Discharge Goal: Progress toward achieving an optimal weight will improve Outcome: Adequate for Discharge   Problem: Skin Integrity: Goal: Risk for impaired skin integrity will decrease Outcome: Adequate for Discharge   Problem: Tissue Perfusion: Goal: Adequacy of tissue perfusion will improve Outcome: Adequate for Discharge   Problem: Education: Goal: Knowledge of General Education information will improve Description: Including pain rating scale, medication(s)/side effects and non-pharmacologic  comfort measures Outcome: Adequate for Discharge   Problem: Health Behavior/Discharge Planning: Goal: Ability to manage health-related needs will improve Outcome: Adequate for Discharge   Problem: Clinical Measurements: Goal: Ability to maintain clinical measurements within normal limits will improve Outcome: Adequate for Discharge Goal: Will remain free from infection Outcome: Adequate for Discharge Goal: Diagnostic test results will improve Outcome: Adequate for Discharge Goal: Respiratory complications will improve Outcome: Adequate for Discharge Goal: Cardiovascular complication will be avoided Outcome: Adequate for Discharge   Problem: Activity: Goal: Risk for activity intolerance will decrease Outcome: Adequate for Discharge   Problem: Nutrition: Goal: Adequate nutrition will be maintained Outcome: Adequate for Discharge   Problem: Coping: Goal: Level of anxiety will decrease Outcome: Adequate for Discharge   Problem: Elimination: Goal: Will not experience complications related to bowel motility Outcome: Adequate for Discharge Goal: Will not experience complications related to urinary retention Outcome: Adequate for Discharge   Problem: Pain Managment: Goal: General experience of comfort will improve and/or be controlled Outcome: Adequate for Discharge   Problem: Safety: Goal: Ability to remain free from injury will improve Outcome: Adequate for Discharge   Problem: Skin Integrity: Goal: Risk for impaired skin integrity will decrease Outcome: Adequate for Discharge

## 2024-02-25 NOTE — Discharge Instructions (Signed)
 Interventional Radiology Percutaneous Cholecystostomy Drain Placement After Care   This sheet gives you information about how to care for yourself after your procedure. Your health care provider may also give you more specific instructions. Your drain was placed by an interventional radiologist with [facility]. If you have questions or concerns, contact [facility] at [phone].   What is a percutaneous drain?   A drain is a small plastic tube (catheter) that goes into the fluid collection in your body through your skin.   How long will I need the drain?   How long the drain needs to stay in is determined by where the drain is, how much comes out of the drain each day and if you are having any other surgical procedures.   Interventional radiology will determine when it is time to remove the drain. It is important to follow up as directed so that the drain can be removed as soon as it is safe to do so.   What can I expect after the procedure?   After the procedure, it is common to have:   A small amount of bruising and discomfort in the area where the drainage tube (catheter) was placed.   Sleepiness and fatigue. This should go away after the medicines you were given have worn off.   Follow these instructions at home:   Insertion site care   Check your insertion site when you change the bandage. Check for:   More redness, swelling, or pain.   More fluid or blood.   Warmth.   Pus or a bad smell.   When caring for your insertion site:   Wash your hands with soap and water for at least 20 seconds before and after you change your bandage (dressing). If soap and water are not available, use hand sanitizer.   You do not need to change your dressing everyday if it is clean and dry. Change your dressing every 3 days or as needed when it is soiled, wet or becoming dislodged. You will need to change your dressing each time you shower.   Leave stitches (sutures),  skin glue, or adhesive strips in place. These skin closures may need to stay in place for 2 weeks or longer. If adhesive strip edges start to loosen and curl up, you may trim the loose edges. Do not remove adhesive strips completely unless your health care provider tells you to do so.   Catheter care   Flush the catheter once per day with 5 mL of 0.9% normal saline unless you are told otherwise by your healthcare provider. This helps to prevent clogs in the catheter.   To disconnect the drain, turn the clear plastic tube to the left. Attach the saline syringe by placing it on the white end of the drain and turning gently to the right. Once attached gently push the plunger to the 5 mL mark. After you are done flushing, disconnect the syringe by turning to the left and reattach your drainage container   If you have a bulb please be sure the bulb is charged after reconnecting it - to do this pinch the bulb between your thumb and first finger and close the stopper located on the top of the bulb.    Check for fluid leaking from around your catheter (instead of fluid draining through your catheter). This may be a sign that the drain is no longer working correctly.   Write down the following information every time you empty your  bag:   The date and time.   The amount of drainage.   Activity   Rest at home for 1-2 days after your procedure.   For the first 48 hours do not lift anything more than 10 lbs (about a gallon of milk). You may perform moderate activities/exercise. Please avoid strenuous activities during this time.   Avoid any activities which may pull on your drain as this can cause your drain to become dislodged.   If you were given a sedative during the procedure, it can affect you for several hours. Do not drive or operate machinery until your health care provider says that it is safe.   General instructions   For mild pain take over-the-counter medications as needed for pain such  as Tylenol  or Advil. If you are experiencing severe pain please call our office as this may indicate an issue with your drain.    If you were prescribed an antibiotic medicine, take it as told by your health care provider. Do not stop using the antibiotic even if you start to feel better.   You may shower 24 hours after the drain is placed. To do this cover the insertion site with a water tight material such as saran wrap and seal the edges with tape, you may also purchase waterproof dressings at your local drug store. Shower as usual and then remove the water tight dressing and any gauze/tape underneath it once you have exited the shower and dried off. Allow the area to air dry or pat dry with a clean towel. Once the skin is completely dry place a new gauze dressing. It is important to keep the site dry at all times to prevent infection.   Do not submerge the drain - this means you cannot take baths, swim, use a hot tub, etc. until the drain is removed.    Do not use any products that contain nicotine  or tobacco, such as cigarettes, e-cigarettes, and chewing tobacco. If you need help quitting, ask your health care provider.   Keep all follow-up visits as told by your health care provider. This is important.   Contact a health care provider if:   You have any of these signs of infection:   More redness, swelling, or pain around your incision area.   More fluid or blood coming from your incision area.   Warmth coming from your incision area.   Pus or a bad smell coming from your incision area.   You have fluid leaking from around your catheter (instead of through your catheter).   You are unable to flush the drain.   You have a fever or chills.   You have pain that does not get better with medicine.   You have not been contacted to schedule a drain follow up appointment within 10 days of discharge from the hospital.   Please call [facility] at [phone] with any  questions or concerns.   Get help right away if:   Your catheter comes out.   You suddenly stop having drainage from your catheter.   You suddenly have blood in the fluid that is draining from your catheter.   You become dizzy or you faint.   You develop a rash.   You have nausea or vomiting.   You have difficulty breathing or you feel short of breath.   You develop chest pain.   You have problems with your speech or vision.   You have trouble balancing or moving  your arms or legs.   Summary   It is common to have a small amount of bruising and discomfort in the area where the drainage tube (catheter) was placed. You may also have minor discomfort with movement while the drain is in place.   Flush the drain once per day with 5 mL of 0.9% normal saline (unless you were told otherwise by your healthcare provider).    Record the amount of drainage from the bag every time you empty it.   Change the dressing every 3 days or earlier if soiled/wet. Keep the skin dry under the dressing.   You may shower with the drain in place. Do not submerge the drain (no baths, swimming, hot tubs, etc.).   Contact [facility] at [phone] if you have more redness, swelling, or pain around your incision area or if you have pain that does not get better with medicine.   This information is not intended to replace advice given to you by your health care provider. Make sure you discuss any questions you have with your health care provider.   Empty your drain at least once per day. You may empty it as often as needed. Use this form to write down the amount of fluid that has collected in the drainage container. Bring this form with you to your follow-up visits. Please call [facility] at [phone] with any questions or concerns prior to your appointment.

## 2024-02-25 NOTE — TOC Progression Note (Addendum)
 Transition of Care [facility]) - Progression Note    Patient Details  Name: Douglas Smith MRN: [id number] Date of Birth: 11/02/1950  Transition of Care [facility]) CM/SW Contact  Odilia Bennett, LCSW Phone Number: 02/25/2024, 10:52 AM  Clinical Narrative:   [facility] is waiting on authorization from [facility] and have discussed with daughter. Daughter asked about sanitary supplies to keep drain site/wound clean. CSW sent secure chat to RN asking her to send him home with 4-5 days of supplies. The home health RN will come to the home to assess supply needs and order. Liaison said it usually takes 48 hours to deliver once ordered. CSW reviewed DND and HINN-12 with daughter. Daughter will be here in 1-2 hours to sign HINN-12. CSW ordered RW through [employer].   3:52 pm: Daughter stated appeal has been denied. She signed HINN-12. She spoke to patient's PCP at the [facility] and confirmed he received the home health request. Authorization is still pending.    Barriers to Discharge: Barriers Resolved  Expected Discharge Plan and Services         Expected Discharge Date: 02/24/24               DME Arranged: Otho Blitz DME Agency: [employer] Date DME Agency Contacted: 02/24/24   Representative spoke with at DME Agency: [facility] HH Arranged: RN HH Agency: [facility] Date HH Agency Contacted: 02/24/24   Representative spoke with at [facility] Agency: Shaun   Social Determinants of Health (SDOH) Interventions SDOH Screenings   Food Insecurity: Patient Declined (02/21/2024)  Housing: Patient Declined (02/21/2024)  Transportation Needs: Patient Declined (02/21/2024)  Utilities: Patient Declined (02/21/2024)  Social Connections: Patient Declined (02/21/2024)  Tobacco Use: Medium Risk (02/20/2024)    Readmission Risk Interventions     No data to display

## 2024-02-25 NOTE — Progress Notes (Signed)
 Patient ID: Douglas Smith, male   DOB: Nov 06, 1950, 73 y.o.   MRN: [id number]     SURGICAL PROGRESS NOTE   Hospital Day(s): 4.   Interval History: Patient seen and examined, no acute events or new complaints overnight. Patient reports feeling significantly weak.  Endorses that he has significant pain in the right upper quadrant.  Pain exacerbated by movement of the abdominal wall.  Pain is alleviated by staying still.  He did tolerate some diet.  Passing gas.  Vital signs in last 24 hours: [min-max] current  Temp:  [98.1 F (36.7 C)-98.6 F (37 C)] 98.4 F (36.9 C) (05/05 0806) Pulse Rate:  [73-80] 75 (05/05 0806) Resp:  [17-18] 17 (05/05 0806) BP: (134-156)/(89-97) 143/92 (05/05 0806) SpO2:  [97 %-100 %] 98 % (05/05 0806)     Height: 6' (182.9 cm) Weight: 83.9 kg BMI (Calculated): 25.08   Physical Exam:  Constitutional: alert, cooperative and no distress  Respiratory: breathing non-labored at rest  Cardiovascular: regular rate and sinus rhythm  Gastrointestinal: soft, non-tender, and non-distended.  Percutaneous cholecystostomy in place with bilious output  Labs:     Latest Ref Rng & Units 02/23/2024    4:10 AM 02/22/2024    4:22 AM 02/21/2024    7:57 AM  CBC  WBC 4.0 - 10.5 K/uL 8.1  7.4  12.0   Hemoglobin 13.0 - 17.0 g/dL 21.3  08.6  57.8   Hematocrit 39.0 - 52.0 % 36.0  37.1  35.5   Platelets 150 - 400 K/uL 306  291  343       Latest Ref Rng & Units 02/24/2024    7:22 AM 02/23/2024    4:10 AM 02/22/2024    4:22 AM  CMP  Glucose 70 - 99 mg/dL 469  629  528   BUN 8 - 23 mg/dL 7  7  9    Creatinine 0.61 - 1.24 mg/dL 4.13  2.44  0.10   Sodium 135 - 145 mmol/L 137  137  141   Potassium 3.5 - 5.1 mmol/L 3.7  3.2  3.7   Chloride 98 - 111 mmol/L 106  106  114   CO2 22 - 32 mmol/L 20  22  21    Calcium  8.9 - 10.3 mg/dL 8.3  8.0  7.9   Total Protein 6.5 - 8.1 g/dL 6.7  5.9  5.7   Total Bilirubin 0.0 - 1.2 mg/dL 0.9  1.0  1.4   Alkaline Phos 38 - 126 U/L 168  189  218   AST 15 - 41  U/L 19  30  90   ALT 0 - 44 U/L 102  148  224     Imaging studies: No new pertinent imaging studies   Assessment/Plan:  73 y.o. male with acute cholecystitis s/p percutaneous cholecystostomy, complicated by pertinent comorbidities including COPD history of PE on Eliquis , hypertension, diabetes.   -Patient with stable vital signs, no fever -Pain better controlled without IV pain medications -He is ambulating, but quite concerned about caring for self w/o help.  -Consider PT/OT consult if patient continue progressive weakness. - Percutaneous cholecystostomy in place - Continue antibiotic therapy, switch to PO on discharge for treatment of cholecystitis.  Goal of 10 days abx copverage.  Patient expected to respond to cholecystostomy drain and antibiotic therapy - Continue current diet. - Disposition: clear for discharge from surgical perspective, will f/u as outpt.   Flynn Hylan, M.D., [facility]  02/25/2024 ; 8:37 AM

## 2024-02-25 NOTE — Progress Notes (Signed)
 Hospitalist Rounding Note  Patient was discharged yesterday 02/24/24, but family have appealed discharge due to home health RN services requiring [place] authorization that can take several days.    Interval Hx: no clinical changes.   Exam:  General exam: awake, alert, no acute distress Respiratory system: on room air, normal respiratory effort. Cardiovascular system: RRR, no pedal edema.   Gastrointestinal system: biliary drain in place, soft, ND Central nervous system: A&O x3. no gross focal neurologic deficits, normal speech Psychiatry: normal mood, congruent affect, judgement and insight appear normal   A&P: as outlined in Discharge Summary without changes.

## 2024-02-26 ENCOUNTER — Ambulatory Visit: Admitting: General Surgery

## 2024-02-26 ENCOUNTER — Other Ambulatory Visit: Payer: Self-pay | Admitting: Surgery

## 2024-02-26 ENCOUNTER — Encounter: Payer: Self-pay | Admitting: Surgery

## 2024-02-26 DIAGNOSIS — K810 Acute cholecystitis: Secondary | ICD-10-CM

## 2024-02-26 LAB — CULTURE, BLOOD (ROUTINE X 2)
Culture: NO GROWTH
Culture: NO GROWTH
Special Requests: ADEQUATE
Special Requests: ADEQUATE

## 2024-02-27 LAB — AEROBIC/ANAEROBIC CULTURE W GRAM STAIN (SURGICAL/DEEP WOUND)

## 2024-02-28 ENCOUNTER — Ambulatory Visit: Admitting: General Surgery

## 2024-02-28 ENCOUNTER — Ambulatory Visit: Admitting: Surgery

## 2024-03-28 ENCOUNTER — Other Ambulatory Visit: Payer: Self-pay

## 2024-03-28 ENCOUNTER — Emergency Department
Admission: EM | Admit: 2024-03-28 | Discharge: 2024-03-28 | Disposition: A | Discharge status: Home / Self Care | Attending: Emergency Medicine | Admitting: Emergency Medicine

## 2024-03-28 DIAGNOSIS — L03311 Cellulitis of abdominal wall: Secondary | ICD-10-CM | POA: Insufficient documentation

## 2024-03-28 DIAGNOSIS — F17200 Nicotine dependence, unspecified, uncomplicated: Secondary | ICD-10-CM | POA: Diagnosis not present

## 2024-03-28 DIAGNOSIS — I10 Essential (primary) hypertension: Secondary | ICD-10-CM | POA: Insufficient documentation

## 2024-03-28 DIAGNOSIS — E119 Type 2 diabetes mellitus without complications: Secondary | ICD-10-CM | POA: Insufficient documentation

## 2024-03-28 LAB — COMPREHENSIVE METABOLIC PANEL WITH GFR
ALT: 29 U/L (ref 0–44)
AST: 18 U/L (ref 15–41)
Albumin: 3.8 g/dL (ref 3.5–5.0)
Alkaline Phosphatase: 88 U/L (ref 38–126)
Anion gap: 9 (ref 5–15)
BUN: 15 mg/dL (ref 8–23)
CO2: 22 mmol/L (ref 22–32)
Calcium: 8.9 mg/dL (ref 8.9–10.3)
Chloride: 108 mmol/L (ref 98–111)
Creatinine, Ser: 0.77 mg/dL (ref 0.61–1.24)
GFR, Estimated: >60 mL/min (ref >60)
Glucose, Bld: 168 mg/dL — ABNORMAL HIGH (ref 70–99)
Potassium: 3.5 mmol/L (ref 3.5–5.1)
Sodium: 139 mmol/L (ref 135–145)
Total Bilirubin: 0.6 mg/dL (ref 0.0–1.2)
Total Protein: 7.3 g/dL (ref 6.5–8.1)

## 2024-03-28 LAB — CBC WITH DIFFERENTIAL/PLATELET
Abs Immature Granulocytes: 0.02 10*3/uL (ref 0.00–0.07)
Basophils Absolute: 0.1 10*3/uL (ref 0.0–0.1)
Basophils Relative: 1 %
Eosinophils Absolute: 0.3 10*3/uL (ref 0.0–0.5)
Eosinophils Relative: 4 %
HCT: 44.8 % (ref 39.0–52.0)
Hemoglobin: 14.2 g/dL (ref 13.0–17.0)
Immature Granulocytes: 0 %
Lymphocytes Relative: 19 %
Lymphs Abs: 1.4 10*3/uL (ref 0.7–4.0)
MCH: 26.7 pg (ref 26.0–34.0)
MCHC: 31.7 g/dL (ref 30.0–36.0)
MCV: 84.2 fL (ref 80.0–100.0)
Monocytes Absolute: 0.5 10*3/uL (ref 0.1–1.0)
Monocytes Relative: 7 %
Neutro Abs: 4.9 10*3/uL (ref 1.7–7.7)
Neutrophils Relative %: 69 %
Platelets: 207 10*3/uL (ref 150–400)
RBC: 5.32 MIL/uL (ref 4.22–5.81)
RDW: 17.2 % — ABNORMAL HIGH (ref 11.5–15.5)
WBC: 7.2 10*3/uL (ref 4.0–10.5)
nRBC: 0 % (ref 0.0–0.2)

## 2024-03-28 MED ORDER — DOXYCYCLINE HYCLATE 100 MG PO TABS: 100.0000 mg | ORAL_TABLET | Freq: Two times a day (BID) | ORAL | 0 refills | Status: DC

## 2024-03-28 MED ORDER — DOXYCYCLINE HYCLATE 100 MG PO TABS: 100.0000 mg | ORAL_TABLET | Freq: Two times a day (BID) | ORAL | 0 refills | Status: AC

## 2024-03-28 NOTE — ED Provider Notes (Signed)
 [facility] Provider Note   Event Date/Time   First MD Initiated Contact with Patient 03/28/24 1522     (approximate) History  possible infection with drain  HPI Douglas Smith is a 73 y.o. male with a past medical history of hypertension, tobacco abuse, type 2 diabetes, and right upper quadrant drain in place secondary to cholecystitis 1 month prior to arrival.  Patient states that over the last 2 days he has had increasing pain whenever the slide is manipulated.  Patient does endorse a red rash around the insertion site of this drain.  Patient was seen by home health nurse today and told to come to the emergency department due to the look of his skin around the drain.  Patient states that he was placed on antibiotic therapy but did not use his antibiotics on time and as prescribed since they were prescribed ROS: Patient currently denies any vision changes, tinnitus, difficulty speaking, facial droop, sore throat, chest pain, shortness of breath, nausea/vomiting/diarrhea, dysuria, or weakness/numbness/paresthesias in any extremity   Physical Exam  Triage Vital Signs: ED Triage Vitals  Encounter Vitals Group     BP 03/28/24 1416 134/85     Systolic BP Percentile --      Diastolic BP Percentile --      Pulse Rate 03/28/24 1416 78     Resp 03/28/24 1416 16     Temp 03/28/24 1416 98.5 F (36.9 C)     Temp Source 03/28/24 1416 Oral     SpO2 03/28/24 1416 97 %     Weight 03/28/24 1417 179 lb (81.2 kg)     Height 03/28/24 1417 5\' 11"  (1.803 m)     Head Circumference --      Peak Flow --      Pain Score 03/28/24 1413 0     Pain Loc --      Pain Education --      Exclude from Growth Chart --    Most recent vital signs: Vitals:   03/28/24 1416 03/28/24 1650  BP: 134/85   Pulse: 78   Resp: 16   Temp: 98.5 F (36.9 C)   SpO2: 97% 97%   General: Awake, oriented x4. CV:  Good peripheral perfusion. Resp:  Normal effort.  Drain in place to right upper  quadrant with mild surrounding erythema and induration Abd:  No distention. Other:  Elderly well-developed, well-nourished African-American male resting comfortably in no acute distress ED Results / Procedures / Treatments  Labs (all labs ordered are listed, but only abnormal results are displayed) Labs Reviewed  COMPREHENSIVE METABOLIC PANEL WITH GFR - Abnormal; Notable for the following components:      Result Value   Glucose, Bld 168 (*)    All other components within normal limits  CBC WITH DIFFERENTIAL/PLATELET - Abnormal; Notable for the following components:   RDW 17.2 (*)    All other components within normal limits   PROCEDURES: Critical Care performed: No Procedures MEDICATIONS ORDERED IN ED: Medications - No data to display IMPRESSION / MDM / ASSESSMENT AND PLAN / ED COURSE  I reviewed the triage vital signs and the nursing notes.                             The patient is on the cardiac monitor to evaluate for evidence of arrhythmia and/or significant heart rate changes. Patient's presentation is most consistent with acute presentation with potential threat to  life or bodily function. Presentation most consistent with simple cellulitis. Given History, Exam, and Workup I have low suspicion for Necrotizing Fasciitis, Abscess, Osteomyelitis, DVT or other emergent problem as a cause for this presentation.  Rx: Doxycycline 100 mg twice daily x5 days  Disposition: Discharge. No evidence of serious bacterial illness. Nontoxic appearing, VSS. Low risk for treatment failure based on history. Strict return precautions discussed with patient with full understanding. Advised patient to follow up promptly with primary care provider within next 48 hours.   FINAL CLINICAL IMPRESSION(S) / ED DIAGNOSES   Final diagnoses:  Cellulitis of abdominal wall   Rx / DC Orders   ED Discharge Orders          Ordered    doxycycline (VIBRA-TABS) 100 MG tablet  2 times daily,   Status:   Discontinued        03/28/24 1609    doxycycline (VIBRA-TABS) 100 MG tablet  2 times daily        03/28/24 1718           Note:  This document was prepared using Dragon voice recognition software and may include unintentional dictation errors.   Charleen Conn, MD 03/28/24 Alphonso Aschoff

## 2024-03-28 NOTE — ED Triage Notes (Addendum)
 Pt arrived via POV with c/o a possible infection to the drain on right flank that pt received at the beginning of May to drain an infection in the pt's gallbladder. Pt was seen by [facility] nurse today and was told to come and see us . Pt was on abx therapy when drain was placed but did not take ABX according to instructions. Pt states that when the drain is manipulated pain is 5/10 and that has been going on for 2 days. Pt is A&Ox4 and ambulatory during triage.

## 2024-04-03 ENCOUNTER — Ambulatory Visit: Admitting: Surgery

## 2024-04-03 ENCOUNTER — Ambulatory Visit (INDEPENDENT_AMBULATORY_CARE_PROVIDER_SITE_OTHER): Admitting: Surgery

## 2024-04-03 ENCOUNTER — Other Ambulatory Visit: Payer: Self-pay | Admitting: Surgery

## 2024-04-03 ENCOUNTER — Encounter: Payer: Self-pay | Admitting: Surgery

## 2024-04-03 VITALS — BP 130/87 | HR 89 | Temp 98.8°F | Ht 71.0 in | Wt 180.0 lb

## 2024-04-03 DIAGNOSIS — K810 Acute cholecystitis: Secondary | ICD-10-CM | POA: Diagnosis not present

## 2024-04-03 MED ORDER — SODIUM CHLORIDE FLUSH 0.9 % IV SOLN: INTRAVENOUS | 3 refills | Status: DC

## 2024-04-03 NOTE — Patient Instructions (Signed)
 We have scheduled you for a CT Scan of your Abdomen with contrast. This has been scheduled at [facility] on Monday 04/07/24 at 3:15pm . Please arrive there at 1:30pm to start drinking your contrast . If you need to reschedule your Scan, you may do so by calling [phone]. Please let us  know if you reschedule your scan as we have to get authorization from your insurance for this.    We also ordered you to have some labs done, CBC and CMP, this can be done at [facility].      Please call the office if you have any questions or concerns

## 2024-04-03 NOTE — Progress Notes (Signed)
 Refill with saline flushes for cholecystostomy drain.

## 2024-04-03 NOTE — Progress Notes (Signed)
 [facility] POST-OP OFFICE VISIT  04/03/2024  HPI: Douglas Smith is a 73 y.o. male had cholecystostomy placement on Feb 22, 2024, now presents with well-functioning drainage of bile, no recent history of jaundice or acholic stools.  Denies fevers and chills, with recent initiation of oral antibiotics for an adjacent drain cellulitic change.  Reports good resolution of the cellulitis, has resecured the drain.  Has follow-up with IR in the coming week.  He is progressing on to over 6 weeks out from his initial hospitalization, and would be worthwhile to obtain a repeat CT scan to evaluate the progress of all the inflammatory change previously noted.  He appears to be progressing well, and clearly interested in proceeding with an elective cholecystectomy to remove the drain.  His last CT completed May 1 prior to drain placement showed the following:  IMPRESSION: 1. Gallbladder wall is thickened and irregular with high density material in the lumen of the gallbladder which may be sludge or blood. Calcified gallstone towards the neck of the gallbladder. Coronal imaging suggests the presence of a mucosal defect with dissection into the perinephric space of the gallbladder fossa, findings concerning for mural perforation and evolving pericholecystic abscess. Possible second mural defect towards the fundus cranially with potential tiny gas bubble. Imaging features are compatible with acute cholecystitis with gallbladder perforation. 2. Heterogeneous liver perfusion with apparent hyperemia around the gallbladder fossa. This is likely reactive related to the gallbladder process. 3. Mild wall thickening proximal descending duodenum with adjacent periduodenal edema/fluid tracking down from the region of the gallbladder fossa. These changes are felt to be secondary. 4. Right lower lobe collapse/consolidation with some probable minimal atelectasis in the left base. 5. Multiple  pulmonary nodules. Most severe: 5 mm left solid pulmonary nodule. No follow-up needed if patient is low-risk (and has no known or suspected primary neoplasm). Non-contrast chest CT can be considered in 12 months if patient is high-risk. This recommendation follows the consensus statement: Guidelines for Management of Incidental Pulmonary Nodules Detected on CT Images: From the Fleischner Society 2017; Radiology 2017; 284:228-243. 6.  Aortic Atherosclerosis (ICD10-I70.0).  Vital signs: BP 130/87   Pulse 89   Temp 98.8 F (37.1 C) (Oral)   Ht 5' 11 (1.803 m)   Wt 180 lb (81.6 kg)   SpO2 98%   BMI 25.10 kg/m    Physical Exam: Constitutional: He appears well Abdomen: Soft benign nontender, well-secured right upper quadrant trans costal drain present. Skin: No evidence of erythema or persisting cellulitis about drain site.  Assessment/Plan: This is a 73 y.o. male with a cholecystostomy tube for severe inflammatory changes involving the gallbladder with a large calcified stone known.  Progressing well, shows an interest in proceeding with definitive cholecystectomy.  Will allow IR to recheck the drain function and follow-up CT scanning and will consider robotic cholecystectomy upon follow-up visit in 2 to 3 weeks.  Patient Active Problem List   Diagnosis Date Noted   Acute cholecystitis 02/21/2024   Acute sepsis (HCC) 02/21/2024   TIA (transient ischemic attack) 04/27/2021   Acute pulmonary embolism (HCC) 04/12/2021   Tobacco abuse 04/12/2021   DVT (deep venous thrombosis) (HCC) 04/12/2021   HTN (hypertension)    Lung nodule    Thyroid nodule    Leukocytosis    Hypokalemia    Elevated troponin       Flynn Hylan M.D., FACS 04/03/2024, 3:20 PM

## 2024-04-07 ENCOUNTER — Ambulatory Visit
Admission: RE | Admit: 2024-04-07 | Discharge: 2024-04-07 | Disposition: A | Discharge status: Home / Self Care | Source: Ambulatory Visit | Attending: Surgery | Admitting: Surgery

## 2024-04-07 ENCOUNTER — Telehealth: Payer: Self-pay | Admitting: Surgery

## 2024-04-07 DIAGNOSIS — K810 Acute cholecystitis: Secondary | ICD-10-CM | POA: Diagnosis present

## 2024-04-07 MED ORDER — IOHEXOL 9 MG/ML PO SOLN
500.0000 mL | ORAL | Status: AC
  Administered 2024-04-07 (×2): 500 mL via ORAL

## 2024-04-07 MED ORDER — IOHEXOL 300 MG/ML  SOLN
85.0000 mL | Freq: Once | INTRAMUSCULAR | Status: AC | PRN
Start: 2024-04-07 — End: 2024-04-07
  Administered 2024-04-07: 85 mL via INTRAVENOUS

## 2024-04-07 NOTE — Telephone Encounter (Signed)
 Pt daughters said her fathers syringes were sent to the [place] and was suppose to go to [facility] in [place] not [place]. He needs these.

## 2024-04-08 ENCOUNTER — Other Ambulatory Visit

## 2024-04-08 ENCOUNTER — Other Ambulatory Visit: Payer: Self-pay | Admitting: Surgery

## 2024-04-08 MED ORDER — SODIUM CHLORIDE FLUSH 0.9 % IV SOLN: INTRAVENOUS | 3 refills | Status: DC

## 2024-04-08 NOTE — Progress Notes (Signed)
Resending to correct pharmacy.

## 2024-04-11 ENCOUNTER — Other Ambulatory Visit: Payer: Self-pay

## 2024-04-11 MED ORDER — SODIUM CHLORIDE FLUSH 0.9 % IV SOLN
INTRAVENOUS | 3 refills | Status: AC
  Filled 2024-04-11: qty 300, 60d supply, fill #0

## 2024-04-15 ENCOUNTER — Ambulatory Visit
Admission: RE | Admit: 2024-04-15 | Discharge: 2024-04-15 | Disposition: A | Discharge status: Home / Self Care | Source: Ambulatory Visit | Attending: Surgery | Admitting: Surgery

## 2024-04-15 ENCOUNTER — Ambulatory Visit
Admission: RE | Admit: 2024-04-15 | Discharge: 2024-04-15 | Disposition: A | Discharge status: Home / Self Care | Source: Ambulatory Visit | Attending: Radiology | Admitting: Radiology

## 2024-04-15 ENCOUNTER — Other Ambulatory Visit
Admission: RE | Admit: 2024-04-15 | Discharge: 2024-04-15 | Disposition: A | Discharge status: Home / Self Care | Attending: Surgery | Admitting: Surgery

## 2024-04-15 DIAGNOSIS — K810 Acute cholecystitis: Secondary | ICD-10-CM

## 2024-04-15 HISTORY — PX: IR RADIOLOGIST EVAL & MGMT: IMG5224

## 2024-04-15 LAB — COMPREHENSIVE METABOLIC PANEL WITH GFR
ALT: 55 U/L — ABNORMAL HIGH (ref 0–44)
AST: 31 U/L (ref 15–41)
Albumin: 3.9 g/dL (ref 3.5–5.0)
Alkaline Phosphatase: 99 U/L (ref 38–126)
Anion gap: 8 (ref 5–15)
BUN: 12 mg/dL (ref 8–23)
CO2: 23 mmol/L (ref 22–32)
Calcium: 9 mg/dL (ref 8.9–10.3)
Chloride: 109 mmol/L (ref 98–111)
Creatinine, Ser: 0.76 mg/dL (ref 0.61–1.24)
GFR, Estimated: >60 mL/min (ref >60)
Glucose, Bld: 147 mg/dL — ABNORMAL HIGH (ref 70–99)
Potassium: 3.8 mmol/L (ref 3.5–5.1)
Sodium: 140 mmol/L (ref 135–145)
Total Bilirubin: 0.6 mg/dL (ref 0.0–1.2)
Total Protein: 7.3 g/dL (ref 6.5–8.1)

## 2024-04-15 LAB — CBC
HCT: 48.6 % (ref 39.0–52.0)
Hemoglobin: 15.7 g/dL (ref 13.0–17.0)
MCH: 26.8 pg (ref 26.0–34.0)
MCHC: 32.3 g/dL (ref 30.0–36.0)
MCV: 83.1 fL (ref 80.0–100.0)
Platelets: 202 10*3/uL (ref 150–400)
RBC: 5.85 MIL/uL — ABNORMAL HIGH (ref 4.22–5.81)
RDW: 16.1 % — ABNORMAL HIGH (ref 11.5–15.5)
WBC: 6.6 10*3/uL (ref 4.0–10.5)
nRBC: 0 % (ref 0.0–0.2)

## 2024-04-15 NOTE — Progress Notes (Signed)
 Chief Complaint: Patient was seen in consultation today for cholecystostomy follow up  at the request of Rodenberg,Denny  Referring Physician(s): Rodenberg,Denny  Supervising Physician: Hughes Simmonds  History of Present Illness: Douglas Smith is a 73 y.o. male with past medical history significant for tobacco use, DM, HTN, DVT, PE and acute cholecystitis s/p percutaneous cholecystostomy placement 03/03/24 in IR (Dr. Alona) who presents today for drain follow up.  Douglas Smith reports pain with flushing the drain if done too fast, some insertion site pain especially with movement. He continues to flush the drain once per day with 10 mL normal saline and is having approximately 100 mL of clear, dark yellow output per day. He denies fevers, chills, significant abdominal pain, nausea, vomiting or diarrhea. He was seen by his surgeon (Dr. Lane) on 04/07/24 with plans for likely cholecystectomy in 3-4 weeks.   Past Medical History:  Diagnosis Date   Diabetes mellitus without complication (HCC)    DVT (deep venous thrombosis) (HCC)    HTN (hypertension)    PE (pulmonary thromboembolism) (HCC)    Tobacco abuse     Past Surgical History:  Procedure Laterality Date   IR PERC CHOLECYSTOSTOMY  02/22/2024   left shoulder surgery Left     Allergies: Patient has no known allergies.  Medications: Prior to Admission medications   Medication Sig Start Date End Date Taking? Authorizing Provider  acetaminophen  (TYLENOL ) 325 MG tablet Take 2 tablets (650 mg total) by mouth every 6 (six) hours as needed for mild pain (pain score 1-3) (or Fever >/= 101). 02/24/24   Fausto Burnard LABOR, DO  albuterol  (PROVENTIL ) (2.5 MG/3ML) 0.083% nebulizer solution Take 3 mLs (2.5 mg total) by nebulization every 4 (four) hours as needed for wheezing or shortness of breath. 10/12/21   Sung, Jade J, MD  albuterol  (VENTOLIN  HFA) [phone] Base) MCG/ACT inhaler Inhale 2 puffs into the lungs every 4 (four) hours  as needed for wheezing or shortness of breath. 10/12/21   Sung, Jade J, MD  apixaban  (ELIQUIS ) 5 MG TABS tablet Take 1 tablet (5 mg total) by mouth 2 (two) times daily. 04/28/21 02/21/24  Britta Lacks, MD  aspirin  EC 81 MG tablet Take 81 mg by mouth daily. Swallow whole.    [provider]  atorvastatin  (LIPITOR) 20 MG tablet Take 20 mg by mouth daily. 01/10/24   [provider]  empagliflozin  (JARDIANCE ) 25 MG TABS tablet Take 12.5 mg by mouth daily. 08/15/23   [provider]  losartan  (COZAAR ) 50 MG tablet Take 25 mg by mouth daily. 08/15/23   [provider]  Multiple Vitamins-Minerals (PRESERVISION AREDS) TABS Take 1 tablet by mouth 2 (two) times daily.    [provider]  naproxen sodium (ALEVE) 220 MG tablet Take 220 mg by mouth 2 (two) times daily as needed (Pain).    [provider]  Nebulizers (COMPRESSOR/NEBULIZER) MISC 1 Units by Does not apply route every 4 (four) hours as needed. 10/12/21   Sung, Jade J, MD  omeprazole  (PRILOSEC ) 20 MG capsule Take 20 mg by mouth daily.    [provider]  oxyCODONE  (OXY IR/ROXICODONE ) 5 MG immediate release tablet Take 1 tablet (5 mg total) by mouth every 4 (four) hours as needed for moderate pain (pain score 4-6). 02/24/24   Fausto Burnard A, DO  sodium chloride  flush 0.9 % SOLN injection Flush the drain with 5 mL of normal saline every day. 04/11/24   Lane Shope, MD     Family  History  Problem Relation Age of Onset   Stroke Brother     Social History   Socioeconomic History   Marital status: Single    Spouse name: Not on file   Number of children: Not on file   Years of education: Not on file   Highest education level: Not on file  Occupational History   Not on file  Tobacco Use   Smoking status: Former    Types: Cigarettes   Smokeless tobacco: Never  Vaping Use   Vaping status: Never Used  Substance and Sexual Activity   Alcohol use: Not Currently   Drug use: Never    Sexual activity: Not on file  Other Topics Concern   Not on file  Social History Narrative   Not on file   Social Drivers of Health   Financial Resource Strain: Not on file  Food Insecurity: Patient Declined (02/21/2024)   Hunger Vital Sign    Worried About Running Out of Food in the Last Year: Patient declined    Ran Out of Food in the Last Year: Patient declined  Transportation Needs: Patient Declined (02/21/2024)   PRAPARE - Administrator, Civil Service (Medical): Patient declined    Lack of Transportation (Non-Medical): Patient declined  Physical Activity: Not on file  Stress: Not on file  Social Connections: Patient Declined (02/21/2024)   Social Connection and Isolation Panel    Frequency of Communication with Friends and Family: Patient declined    Frequency of Social Gatherings with Friends and Family: Patient declined    Attends Religious Services: Patient declined    Database administrator or Organizations: Patient declined    Attends Banker Meetings: Patient declined    Marital Status: Patient declined    Review of Systems: A 12 point ROS discussed and pertinent positives are indicated in the HPI above.  All other systems are negative.  Review of Systems  Constitutional:  Negative for chills and fever.  Respiratory:  Negative for cough and shortness of breath.   Cardiovascular:  Negative for chest pain.  Gastrointestinal:  Negative for abdominal pain, diarrhea, nausea and vomiting.  Musculoskeletal:  Negative for back pain.    Vital Signs: BP: 160/101 mmHg HR:  81 Temp: 98.74F SpO2: 96% on RA  Physical Exam Vitals reviewed.  Constitutional:      General: He is not in acute distress. HENT:     Head: Normocephalic.   Cardiovascular:     Rate and Rhythm: Normal rate.  Pulmonary:     Effort: Pulmonary effort is normal.  Abdominal:     Comments: (+) mildly tender to palpation at drain insertion site, mild reddening of the skin  without pus or warmth. Drain injection shows the drain to be patent with complete opacification of the biliary tree. Initial drain injection was not painful however follow up 5 mL flush did cause pain near the insertion site per patient. No leakage from insertion site noted.   Skin:    General: Skin is warm and dry.   Neurological:     Mental Status: He is alert.     Imaging: No results found.  Labs:  CBC: Recent Labs    02/21/24 0757 02/22/24 0422 02/23/24 0410 03/28/24 1421  WBC 12.0* 7.4 8.1 7.2  HGB 11.8* 11.5* 11.7* 14.2  HCT 35.5* 37.1* 36.0* 44.8  PLT 343 291 306 207    COAGS: Recent Labs    02/21/24 0757 02/21/24 1925 02/22/24 0849  INR  1.4*  --  1.2  APTT 35 104*  --     BMP: Recent Labs    02/22/24 0422 02/23/24 0410 02/24/24 0722 03/28/24 1421  NA 141 137 137 139  K 3.7 3.2* 3.7 3.5  CL 114* 106 106 108  CO2 21* 22 20* 22  GLUCOSE 107* 131* 135* 168*  BUN 9 7* 7* 15  CALCIUM  7.9* 8.0* 8.3* 8.9  CREATININE 0.86 0.74 0.62 0.77  GFRNONAA >60 >60 >60 >60    LIVER FUNCTION TESTS: Recent Labs    02/22/24 0422 02/23/24 0410 02/24/24 0722 03/28/24 1421  BILITOT 1.4* 1.0 0.9 0.6  AST 90* 30 19 18   ALT 224* 148* 102* 29  ALKPHOS 218* 189* 168* 88  PROT 5.7* 5.9* 6.7 7.3  ALBUMIN 2.2* 2.3* 2.5* 3.8    TUMOR MARKERS: No results for input(s): AFPTM, CEA, CA199, CHROMGRNA in the last 8760 hours.  Assessment:  73 y/o M with history of acute cholecystitis s/p percutaneous cholecystostomy placement 02/21/24 in IR (Dr. Alona) who presents today for drain follow up.  Per patient, he continues to flush every day with 10 mL NS - some pain at insertion site if flushed too fast but otherwise no pain. Output has been ~100 mL every day of clear, dark yellow fluid (bilious appearing on exam). No fevers, chills, abdominal pain, etc.   Drain injection performed today which shows patency of the cystic duct. Drain exchange today was encouraged to  ensure patency of the drain until elective cholecystostomy however patient adamantly declined this today due to concerns for possible pain. He was encouraged to call if he notes significant decrease in drain output or difficulty flushing prior to cholecystectomy.  Plan: - Continue to flush with 10 mL every day, record output at least once per day - Dressing changes every 2-3 days or earlier if soiled - Maintain follow up appointments with general surgery - No further IR needs at this time, patient was encouraged to call with questions or concerns  Electronically Signed: Clotilda DELENA Hesselbach PA-C 04/15/2024, 8:49 AM   Please refer to Dr. Darrin attestation of this note for management and plan.

## 2024-04-16 ENCOUNTER — Other Ambulatory Visit: Payer: Self-pay

## 2024-04-16 ENCOUNTER — Emergency Department
Admission: EM | Admit: 2024-04-16 | Discharge: 2024-04-16 | Disposition: A | Discharge status: Home / Self Care | Attending: Emergency Medicine | Admitting: Emergency Medicine

## 2024-04-16 DIAGNOSIS — R1011 Right upper quadrant pain: Secondary | ICD-10-CM | POA: Insufficient documentation

## 2024-04-16 DIAGNOSIS — G8918 Other acute postprocedural pain: Secondary | ICD-10-CM | POA: Diagnosis present

## 2024-04-16 LAB — CBC WITH DIFFERENTIAL/PLATELET
Abs Immature Granulocytes: 0.04 10*3/uL (ref 0.00–0.07)
Basophils Absolute: 0 10*3/uL (ref 0.0–0.1)
Basophils Relative: 0 %
Eosinophils Absolute: 0.1 10*3/uL (ref 0.0–0.5)
Eosinophils Relative: 1 %
HCT: 47.9 % (ref 39.0–52.0)
Hemoglobin: 15.2 g/dL (ref 13.0–17.0)
Immature Granulocytes: 0 %
Lymphocytes Relative: 7 %
Lymphs Abs: 0.6 10*3/uL — ABNORMAL LOW (ref 0.7–4.0)
MCH: 26.6 pg (ref 26.0–34.0)
MCHC: 31.7 g/dL (ref 30.0–36.0)
MCV: 83.7 fL (ref 80.0–100.0)
Monocytes Absolute: 0.6 10*3/uL (ref 0.1–1.0)
Monocytes Relative: 6 %
Neutro Abs: 8 10*3/uL — ABNORMAL HIGH (ref 1.7–7.7)
Neutrophils Relative %: 86 %
Platelets: 163 10*3/uL (ref 150–400)
RBC: 5.72 MIL/uL (ref 4.22–5.81)
RDW: 16.2 % — ABNORMAL HIGH (ref 11.5–15.5)
WBC: 9.3 10*3/uL (ref 4.0–10.5)
nRBC: 0 % (ref 0.0–0.2)

## 2024-04-16 LAB — COMPREHENSIVE METABOLIC PANEL WITH GFR
ALT: 53 U/L — ABNORMAL HIGH (ref 0–44)
AST: 26 U/L (ref 15–41)
Albumin: 3.8 g/dL (ref 3.5–5.0)
Alkaline Phosphatase: 96 U/L (ref 38–126)
Anion gap: 11 (ref 5–15)
BUN: 17 mg/dL (ref 8–23)
CO2: 20 mmol/L — ABNORMAL LOW (ref 22–32)
Calcium: 9 mg/dL (ref 8.9–10.3)
Chloride: 107 mmol/L (ref 98–111)
Creatinine, Ser: 0.82 mg/dL (ref 0.61–1.24)
GFR, Estimated: >60 mL/min (ref >60)
Glucose, Bld: 128 mg/dL — ABNORMAL HIGH (ref 70–99)
Potassium: 3.6 mmol/L (ref 3.5–5.1)
Sodium: 138 mmol/L (ref 135–145)
Total Bilirubin: 1.4 mg/dL — ABNORMAL HIGH (ref 0.0–1.2)
Total Protein: 7.4 g/dL (ref 6.5–8.1)

## 2024-04-16 LAB — LIPASE, BLOOD: Lipase: 33 U/L (ref 11–51)

## 2024-04-16 MED ORDER — OXYCODONE-ACETAMINOPHEN 5-325 MG PO TABS
1.0000 | ORAL_TABLET | Freq: Once | ORAL | Status: AC
  Administered 2024-04-16: 1 via ORAL
  Filled 2024-04-16: qty 1

## 2024-04-16 MED ORDER — OXYCODONE-ACETAMINOPHEN 5-325 MG PO TABS: 1.0000 | ORAL_TABLET | ORAL | 0 refills | Status: DC | PRN

## 2024-04-16 NOTE — ED Provider Notes (Signed)
 [facility] Provider Note   Event Date/Time   First MD Initiated Contact with Patient 04/16/24 [phone]     (approximate) History  Abdominal Pain and Gallbladder Tube Issue   HPI Douglas Smith is a 73 y.o. male with recent past medical history of cholecystitis status post cholecystostomy drain placement who presents complaining of persistent pain around this cholecystostomy tube.  Patient states that he was seen at IR yesterday and refused to have this tube replaced and therefore presented today as they did not treat my pain.  Patient endorses pain with any movement.  Patient denies any nausea/vomiting.  Patient does endorse continued drainage from this tube that has not changed in consistency, color, and contains no blood. ROS: Patient currently denies any vision changes, tinnitus, difficulty speaking, facial droop, sore throat, chest pain, shortness of breath, nausea/vomiting/diarrhea, dysuria, or weakness/numbness/paresthesias in any extremity   Physical Exam  Triage Vital Signs: ED Triage Vitals [04/16/24 0659]  Encounter Vitals Group     BP (!) 141/96     Girls Systolic BP Percentile      Girls Diastolic BP Percentile      Boys Systolic BP Percentile      Boys Diastolic BP Percentile      Pulse Rate (!) 103     Resp 17     Temp 97.7 F (36.5 C)     Temp Source Oral     SpO2 98 %     Weight      Height      Head Circumference      Peak Flow      Pain Score      Pain Loc      Pain Education      Exclude from Growth Chart    Most recent vital signs: Vitals:   04/16/24 0659  BP: (!) 141/96  Pulse: (!) 103  Resp: 17  Temp: 97.7 F (36.5 C)  SpO2: 98%   General: Awake, oriented x4. CV:  Good peripheral perfusion. Resp:  Normal effort. Abd:  No distention.  Cholecystostomy site to right upper quadrant clean, dry, without any surrounding erythema or induration. Other:  Elderly overweight African-American male resting comfortably in no acute  distress ED Results / Procedures / Treatments  Labs (all labs ordered are listed, but only abnormal results are displayed) Labs Reviewed  COMPREHENSIVE METABOLIC PANEL WITH GFR - Abnormal; Notable for the following components:      Result Value   CO2 20 (*)    Glucose, Bld 128 (*)    ALT 53 (*)    Total Bilirubin 1.4 (*)    All other components within normal limits  CBC WITH DIFFERENTIAL/PLATELET - Abnormal; Notable for the following components:   RDW 16.2 (*)    Neutro Abs 8.0 (*)    Lymphs Abs 0.6 (*)    All other components within normal limits  LIPASE, BLOOD   Official radiology report(s): DG Sinus/Fist Tube Chk-Non GI Result Date: 04/15/2024 CLINICAL DATA:  Perc chole. History acute cholecystitis s/p percutaneous cholecystostomy 02/22/2024 EXAM: CHOLANGIOGRAM COMPARISON:  CT AP, 04/07/2024 and 02/21/2024 IR FLUOROSCOPY, 02/22/2024 CONTRAST:  10 mL Isovue-300-administered via the existing percutaneous drain. FLUOROSCOPY: Radiation Exposure Index and estimated peak skin dose (PSD); Reference air kerma (RAK), 3 9 mGy. Kerma-area product (KAP), 163.2 uGy*m. TECHNIQUE: The patient was positioned supine on the fluoroscopy table. A preprocedural spot fluoroscopic image was obtained of the RIGHT quadrant and the existing percutaneous cholecystostomy drain. Multiple spot fluoroscopic and  radiographic images were obtained following the injection of a small amount of contrast via the existing percutaneous drainage catheter. FINDINGS: Widely-patent percutaneous cholecystostomy drainage catheter. Patent cystic duct. No extrahepatic biliary ductal dilatation Multiple distal common bile duct filling defects. See key image. Contrast did not reach the duodenum. IMPRESSION: 1. Patent cholecystostomy drainage catheter and cystic duct. 2. Choledocholithiasis, without extrahepatic biliary ductal dilatation. Procedures performed by Clotilda Hesselbach, IR PA Electronically Signed   By: Thom Hall M.D.   On:  04/15/2024 10:01   IR Radiologist Eval & Mgmt Result Date: 04/15/2024 EXAM: ESTABLISHED PATIENT OFFICE VISIT CHIEF COMPLAINT: See below HISTORY OF PRESENT ILLNESS: See below REVIEW OF SYSTEMS: See below PHYSICAL EXAMINATION: See below ASSESSMENT AND PLAN: Please refer to completed note in the electronic medical record on [place] [facility] Thom Hall, MD [facility] Electronically Signed   By: Thom Hall M.D.   On: 04/15/2024 09:55   PROCEDURES: Critical Care performed: No Procedures MEDICATIONS ORDERED IN ED: Medications  oxyCODONE -acetaminophen  (PERCOCET/ROXICET) 5-325 MG per tablet 1 tablet (1 tablet Oral Given 04/16/24 0752)   IMPRESSION / MDM / ASSESSMENT AND PLAN / ED COURSE  I reviewed the triage vital signs and the nursing notes.                             The patient is on the cardiac monitor to evaluate for evidence of arrhythmia and/or significant heart rate changes. Patient's presentation is most consistent with acute presentation with potential threat to life or bodily function. Patient presents for abdominal pain.  Differential diagnosis includes appendicitis, abdominal aortic aneurysm, surgical biliary disease, pancreatitis, SBO, mesenteric ischemia, serious intra-abdominal bacterial illness, genital torsion. Doubt atypical ACS. Based on history, physical exam, radiologic/laboratory evaluation, there is no red flag results or symptomatology requiring emergent intervention or need for admission at this time.  Patient likely having pain from his cholecystostomy tube.  Patient was offered to replacement and denied.  Patient encouraged to follow-up with his surgeon/interventional radiologist for further management. Pt tolerating PO. Disposition: Patient will be discharged with strict return precautions and follow up with primary MD within 12-24 hours for further evaluation. Patient understands that this still may have an  early presentation of an emergent medical condition such as appendicitis that will require a recheck.   FINAL CLINICAL IMPRESSION(S) / ED DIAGNOSES   Final diagnoses:  Pain at surgical site   Rx / DC Orders   ED Discharge Orders          Ordered    oxyCODONE -acetaminophen  (PERCOCET) 5-325 MG tablet  Every 4 hours PRN        04/16/24 0823           Note:  This document was prepared using Dragon voice recognition software and may include unintentional dictation errors.   Talynn Lebon K, MD 04/16/24 [phone]

## 2024-04-16 NOTE — ED Triage Notes (Addendum)
 Pt coming from home c/o pain from cholecystostomy tube to right ABD. Pt was supposed to get tube changed yesterday at doctors appointment but refused because of pain. Pain is now constant. Pain 7/10. GCS 15. HX: DVT Acute Cholecystitis, TIA, Sepsis

## 2024-04-17 ENCOUNTER — Encounter: Payer: Self-pay | Admitting: Surgery

## 2024-04-17 ENCOUNTER — Telehealth: Payer: Self-pay

## 2024-04-17 ENCOUNTER — Ambulatory Visit (INDEPENDENT_AMBULATORY_CARE_PROVIDER_SITE_OTHER): Admitting: Surgery

## 2024-04-17 ENCOUNTER — Other Ambulatory Visit: Payer: Self-pay

## 2024-04-17 VITALS — BP 127/78 | HR 108 | Temp 98.3°F | Ht 71.0 in | Wt 177.8 lb

## 2024-04-17 DIAGNOSIS — K810 Acute cholecystitis: Secondary | ICD-10-CM

## 2024-04-17 DIAGNOSIS — K8050 Calculus of bile duct without cholangitis or cholecystitis without obstruction: Secondary | ICD-10-CM

## 2024-04-17 DIAGNOSIS — K8064 Calculus of gallbladder and bile duct with chronic cholecystitis without obstruction: Secondary | ICD-10-CM

## 2024-04-17 HISTORY — DX: Calculus of gallbladder and bile duct with chronic cholecystitis without obstruction: K80.64

## 2024-04-17 NOTE — Progress Notes (Signed)
 [facility] POST-OP OFFICE VISIT  04/17/2024  HPI: Douglas Smith is a 73 y.o. male had cholecystostomy placement on Feb 22, 2024, now presents with well-functioning drainage of bile, no recent history of jaundice or acholic stools.  Denies fevers and chills.  He followed up with IR, and refused to allow them to change out the catheter per their regimen.  His repeat CT scan confirms resolution of all the inflammatory change previously noted.  He appears to be progressing well, and clearly interested in proceeding with an elective cholecystectomy to remove the drain.  However during his contrast injection for his cholecystostomy tube check, it was noted he had choledocholithiasis.  Fortunately his LFTs are not significantly abnormal, suggesting an obstruction, nor does the CT scan show any evidence of extrahepatic biliary dilatation.  Again he confirms his stools are brown/green in coloration and denies any claylike or acholic stools.   Vital signs: BP 127/78   Pulse (!) 108   Temp 98.3 F (36.8 C) (Oral)   Ht 5' 11 (1.803 m)   Wt 177 lb 12.8 oz (80.6 kg)   SpO2 95%   BMI 24.80 kg/m    Physical Exam: Constitutional: He appears well, sclerae are without icterus. Abdomen: Soft benign nontender, well-secured right upper quadrant trans costal drain present. Skin: No evidence of erythema or persisting cellulitis about drain site.  Assessment/Plan: This is a 73 y.o. male with a cholecystostomy tube for severe inflammatory changes involving the gallbladder with a large calcified stone known.  Progressing well, shows an interest in proceeding with definitive cholecystectomy.    We have reached out to Dr. Felicitas office to proceed with ERCP for common bile duct clearance prior to proceeding with robotic cholecystectomy. Once this has been scheduled we will proceed with scheduling his operation.  Patient Active Problem List   Diagnosis Date Noted   Acute cholecystitis  02/21/2024   Acute sepsis (HCC) 02/21/2024   TIA (transient ischemic attack) 04/27/2021   Acute pulmonary embolism (HCC) 04/12/2021   Tobacco abuse 04/12/2021   DVT (deep venous thrombosis) (HCC) 04/12/2021   HTN (hypertension)    Lung nodule    Thyroid nodule    Leukocytosis    Hypokalemia    Elevated troponin    I personally spent a total of 45 minutes in the care of the patient today including preparing to see the patient, getting/reviewing separately obtained history, performing a medically appropriate exam/evaluation, counseling and educating, referring and communicating with other health care professionals, documenting clinical information in the EHR, independently interpreting results, and communicating results.    Honor Leghorn M.D., FACS 04/17/2024, 9:08 AM

## 2024-04-17 NOTE — Patient Instructions (Signed)
 You have requested to have your gallbladder removed. This will be done at [facility] with Dr. Claudine Mouton.  If you are on any injectable weight loss medication, you will need to stop taking your GLP-1 injectable (weight loss) medications 8 days before your surgery to avoid any complications with anesthesia.   You will most likely be out of work 1-2 weeks for this surgery.  If you have FMLA or disability paperwork that needs filled out you may drop this off at our office or this can be faxed to [phone].  You will return after your post-op appointment with a lifting restriction for approximately 4 more weeks.  You will be able to eat anything you would like to following surgery. But, start by eating a bland diet and advance this as tolerated. The Gallbladder diet is below, please go as closely by this diet as possible prior to surgery to avoid any further attacks.  Please see the (blue)pre-care form that you have been given today. Our surgery scheduler will call you to verify surgery date and to go over information.   If you have any questions, please call our office.  Laparoscopic Cholecystectomy Laparoscopic cholecystectomy is surgery to remove the gallbladder. The gallbladder is located in the upper right part of the abdomen, behind the liver. It is a storage sac for bile, which is produced in the liver. Bile aids in the digestion and absorption of fats. Cholecystectomy is often done for inflammation of the gallbladder (cholecystitis). This condition is usually caused by a buildup of gallstones (cholelithiasis) in the gallbladder. Gallstones can block the flow of bile, and that can result in inflammation and pain. In severe cases, emergency surgery may be required. If emergency surgery is not required, you will have time to prepare for the procedure. Laparoscopic surgery is an alternative to open surgery. Laparoscopic surgery has a shorter recovery time. Your common bile duct may also  need to be examined during the procedure. If stones are found in the common bile duct, they may be removed. LET [facility] CARE PROVIDER KNOW ABOUT: Any allergies you have. All medicines you are taking, including vitamins, herbs, eye drops, creams, and over-the-counter medicines. Previous problems you or members of your family have had with the use of anesthetics. Any blood disorders you have. Previous surgeries you have had.  Any medical conditions you have. RISKS AND COMPLICATIONS Generally, this is a safe procedure. However, problems may occur, including: Infection. Bleeding. Allergic reactions to medicines. Damage to other structures or organs. A stone remaining in the common bile duct. A bile leak from the cyst duct that is clipped when your gallbladder is removed. The need to convert to open surgery, which requires a larger incision in the abdomen. This may be necessary if your surgeon thinks that it is not safe to continue with a laparoscopic procedure. BEFORE THE PROCEDURE Ask your health care provider about: Changing or stopping your regular medicines. This is especially important if you are taking diabetes medicines or blood thinners. Taking medicines such as aspirin and ibuprofen. These medicines can thin your blood. Do not take these medicines before your procedure if your health care provider instructs you not to. Follow instructions from your health care provider about eating or drinking restrictions. Let your health care provider know if you develop a cold or an infection before surgery. Plan to have someone take you home after the procedure. Ask your health care provider how your surgical site will be marked or identified. You may  be given antibiotic medicine to help prevent infection. PROCEDURE To reduce your risk of infection: Your health care team will wash or sanitize their hands. Your skin will be washed with soap. An IV tube may be inserted into one of your  veins. You will be given a medicine to make you fall asleep (general anesthetic). A breathing tube will be placed in your mouth. The surgeon will make several small cuts (incisions) in your abdomen. A thin, lighted tube (laparoscope) that has a tiny camera on the end will be inserted through one of the small incisions. The camera on the laparoscope will send a picture to a TV screen (monitor) in the operating room. This will give the surgeon a good view inside your abdomen. A gas will be pumped into your abdomen. This will expand your abdomen to give the surgeon more room to perform the surgery. Other tools that are needed for the procedure will be inserted through the other incisions. The gallbladder will be removed through one of the incisions. After your gallbladder has been removed, the incisions will be closed with stitches (sutures), staples, or skin glue. Your incisions may be covered with a bandage (dressing). The procedure may vary among health care providers and hospitals. AFTER THE PROCEDURE Your blood pressure, heart rate, breathing rate, and blood oxygen level will be monitored often until the medicines you were given have worn off. You will be given medicines as needed to control your pain.   This information is not intended to replace advice given to you by your health care provider. Make sure you discuss any questions you have with your health care provider.   Document Released: 10/09/2005 Document Revised: 06/30/2015 Document Reviewed: 05/21/2013 Elsevier Interactive Patient Education 2016 Elsevier Inc.   Low-Fat Diet for Gallbladder Conditions A low-fat diet can be helpful if you have pancreatitis or a gallbladder condition. With these conditions, your pancreas and gallbladder have trouble digesting fats. A healthy eating plan with less fat will help rest your pancreas and gallbladder and reduce your symptoms. WHAT DO I NEED TO KNOW ABOUT THIS DIET? Eat a low-fat  diet. Reduce your fat intake to less than 20-30% of your total daily calories. This is less than 50-60 g of fat per day. Remember that you need some fat in your diet. Ask your dietician what your daily goal should be. Choose nonfat and low-fat healthy foods. Look for the words "nonfat," "low fat," or "fat free." As a guide, look on the label and choose foods with less than 3 g of fat per serving. Eat only one serving. Avoid alcohol. Do not smoke. If you need help quitting, talk with your health care provider. Eat small frequent meals instead of three large heavy meals. WHAT FOODS CAN I EAT? Grains Include healthy grains and starches such as potatoes, wheat bread, fiber-rich cereal, and brown rice. Choose whole grain options whenever possible. In adults, whole grains should account for 45-65% of your daily calories.  Fruits and Vegetables Eat plenty of fruits and vegetables. Fresh fruits and vegetables add fiber to your diet. Meats and Other Protein Sources Eat lean meat such as chicken and pork. Trim any fat off of meat before cooking it. Eggs, fish, and beans are other sources of protein. In adults, these foods should account for 10-35% of your daily calories. Dairy Choose low-fat milk and dairy options. Dairy includes fat and protein, as well as calcium.  Fats and Oils Limit high-fat foods such as fried foods, sweets, baked  goods, sugary drinks.  Other Creamy sauces and condiments, such as mayonnaise, can add extra fat. Think about whether or not you need to use them, or use smaller amounts or low fat options. WHAT FOODS ARE NOT RECOMMENDED? High fat foods, such as: [employer]. Ice cream. [place] toast. Sweet rolls. Pizza. Cheese bread. Foods covered with batter, butter, creamy sauces, or cheese. Fried foods. Sugary drinks and desserts. Foods that cause gas or bloating   This information is not intended to replace advice given to you by your health care provider. Make sure you  discuss any questions you have with your health care provider.   Document Released: 10/14/2013 Document Reviewed: 10/14/2013 Elsevier Interactive Patient Education [employer]

## 2024-04-17 NOTE — H&P (View-Only) (Signed)
 [facility] POST-OP OFFICE VISIT  04/17/2024  HPI: Douglas Smith is a 73 y.o. male had cholecystostomy placement on Feb 22, 2024, now presents with well-functioning drainage of bile, no recent history of jaundice or acholic stools.  Denies fevers and chills.  He followed up with IR, and refused to allow them to change out the catheter per their regimen.  His repeat CT scan confirms resolution of all the inflammatory change previously noted.  He appears to be progressing well, and clearly interested in proceeding with an elective cholecystectomy to remove the drain.  However during his contrast injection for his cholecystostomy tube check, it was noted he had choledocholithiasis.  Fortunately his LFTs are not significantly abnormal, suggesting an obstruction, nor does the CT scan show any evidence of extrahepatic biliary dilatation.  Again he confirms his stools are brown/green in coloration and denies any claylike or acholic stools.   Vital signs: BP 127/78   Pulse (!) 108   Temp 98.3 F (36.8 C) (Oral)   Ht 5' 11 (1.803 m)   Wt 177 lb 12.8 oz (80.6 kg)   SpO2 95%   BMI 24.80 kg/m    Physical Exam: Constitutional: He appears well, sclerae are without icterus. Abdomen: Soft benign nontender, well-secured right upper quadrant trans costal drain present. Skin: No evidence of erythema or persisting cellulitis about drain site.  Assessment/Plan: This is a 73 y.o. male with a cholecystostomy tube for severe inflammatory changes involving the gallbladder with a large calcified stone known.  Progressing well, shows an interest in proceeding with definitive cholecystectomy.    We have reached out to Dr. Felicitas office to proceed with ERCP for common bile duct clearance prior to proceeding with robotic cholecystectomy. Once this has been scheduled we will proceed with scheduling his operation.  Patient Active Problem List   Diagnosis Date Noted   Acute cholecystitis  02/21/2024   Acute sepsis (HCC) 02/21/2024   TIA (transient ischemic attack) 04/27/2021   Acute pulmonary embolism (HCC) 04/12/2021   Tobacco abuse 04/12/2021   DVT (deep venous thrombosis) (HCC) 04/12/2021   HTN (hypertension)    Lung nodule    Thyroid nodule    Leukocytosis    Hypokalemia    Elevated troponin    I personally spent a total of 45 minutes in the care of the patient today including preparing to see the patient, getting/reviewing separately obtained history, performing a medically appropriate exam/evaluation, counseling and educating, referring and communicating with other health care professionals, documenting clinical information in the EHR, independently interpreting results, and communicating results.    Honor Leghorn M.D., FACS 04/17/2024, 9:08 AM

## 2024-04-17 NOTE — Telephone Encounter (Signed)
 Schedule ERCP for Dx Choledocholithiasis

## 2024-04-17 NOTE — Telephone Encounter (Signed)
 Blood thinner clearance has been faxed to [facility] Dr Lucienne [phone], pt's PCP at the [facility]

## 2024-04-21 NOTE — Telephone Encounter (Signed)
 Faxed x 3   Also released to Mychart so that patient and his daughter can send and attach in [place] for PCP to receive form as well as they are saying that they have not received form

## 2024-04-21 NOTE — Telephone Encounter (Signed)
 Faxed x3

## 2024-04-22 NOTE — Telephone Encounter (Signed)
 received the form via fax from [facility] Dr Oralia office. Please hold the Eliquis  1 day prior to procedure and restart 1 day after unless otherwise instructed by Dr Jinny following the procedure.

## 2024-04-28 ENCOUNTER — Ambulatory Visit: Admitting: Anesthesiology

## 2024-04-28 ENCOUNTER — Telehealth: Payer: Self-pay

## 2024-04-28 ENCOUNTER — Encounter
Admission: RE | Disposition: A | Discharge status: Home / Self Care | Payer: Self-pay | Source: Home / Self Care | Attending: Gastroenterology

## 2024-04-28 ENCOUNTER — Ambulatory Visit
Admission: RE | Admit: 2024-04-28 | Discharge: 2024-04-28 | Disposition: A | Discharge status: Home / Self Care | Attending: Gastroenterology | Admitting: Gastroenterology

## 2024-04-28 ENCOUNTER — Encounter: Payer: Self-pay | Admitting: Gastroenterology

## 2024-04-28 ENCOUNTER — Ambulatory Visit

## 2024-04-28 DIAGNOSIS — Z7984 Long term (current) use of oral hypoglycemic drugs: Secondary | ICD-10-CM | POA: Insufficient documentation

## 2024-04-28 DIAGNOSIS — Z87891 Personal history of nicotine dependence: Secondary | ICD-10-CM | POA: Insufficient documentation

## 2024-04-28 DIAGNOSIS — E119 Type 2 diabetes mellitus without complications: Secondary | ICD-10-CM | POA: Diagnosis not present

## 2024-04-28 DIAGNOSIS — J449 Chronic obstructive pulmonary disease, unspecified: Secondary | ICD-10-CM | POA: Insufficient documentation

## 2024-04-28 DIAGNOSIS — K8050 Calculus of bile duct without cholangitis or cholecystitis without obstruction: Secondary | ICD-10-CM | POA: Insufficient documentation

## 2024-04-28 DIAGNOSIS — Z8673 Personal history of transient ischemic attack (TIA), and cerebral infarction without residual deficits: Secondary | ICD-10-CM | POA: Insufficient documentation

## 2024-04-28 DIAGNOSIS — I10 Essential (primary) hypertension: Secondary | ICD-10-CM | POA: Insufficient documentation

## 2024-04-28 DIAGNOSIS — K317 Polyp of stomach and duodenum: Secondary | ICD-10-CM | POA: Insufficient documentation

## 2024-04-28 HISTORY — PX: ERCP: SHX5425

## 2024-04-28 LAB — GLUCOSE, CAPILLARY: Glucose-Capillary: 184 mg/dL — ABNORMAL HIGH (ref 70–99)

## 2024-04-28 SURGERY — ERCP, WITH INTERVENTION IF INDICATED
Anesthesia: General

## 2024-04-28 MED ORDER — PROPOFOL 10 MG/ML IV BOLUS
INTRAVENOUS | Status: DC | PRN
  Administered 2024-04-28: 70 mg via INTRAVENOUS

## 2024-04-28 MED ORDER — PROPOFOL 500 MG/50ML IV EMUL
INTRAVENOUS | Status: DC | PRN
  Administered 2024-04-28: 100 ug/kg/min via INTRAVENOUS

## 2024-04-28 MED ORDER — DICLOFENAC SUPPOSITORY 100 MG: 100.0000 mg | Freq: Once | RECTAL | Status: DC

## 2024-04-28 MED ORDER — LACTATED RINGERS IV SOLN
INTRAVENOUS | Status: DC
  Administered 2024-04-28: 40 mL/h via INTRAVENOUS

## 2024-04-28 MED ORDER — SODIUM CHLORIDE 0.9 % IV SOLN: INTRAVENOUS | Status: DC

## 2024-04-28 MED ORDER — LIDOCAINE HCL (CARDIAC) PF 100 MG/5ML IV SOSY
PREFILLED_SYRINGE | INTRAVENOUS | Status: DC | PRN
  Administered 2024-04-28: 60 mg via INTRAVENOUS

## 2024-04-28 MED ORDER — DICLOFENAC SUPPOSITORY 100 MG
RECTAL | Status: AC
Start: 2024-04-28 — End: 2024-04-28
  Filled 2024-04-28: qty 1

## 2024-04-28 MED ORDER — DICLOFENAC SUPPOSITORY 100 MG
RECTAL | Status: DC | PRN
  Administered 2024-04-28: 100 mg via RECTAL

## 2024-04-28 NOTE — Transfer of Care (Signed)
 Immediate Anesthesia Transfer of Care Note  Patient: Douglas Smith  Procedure(s) Performed: ERCP, WITH INTERVENTION IF INDICATED  Patient Location: Endoscopy Unit  Anesthesia Type:General  Level of Consciousness: oriented and drowsy  Airway & Oxygen Therapy: Patient Spontanous Breathing  Post-op Assessment: Report given to RN and Post -op Vital signs reviewed and stable  Post vital signs: Reviewed and stable  Last Vitals:  Vitals Value Taken Time  BP 144/98 04/28/24 09:56  Temp 35.6 C 04/28/24 09:56  Pulse 95 04/28/24 09:57  Resp 10 04/28/24 09:57  SpO2 99 % 04/28/24 09:57  Vitals shown include unfiled device data.  Last Pain:  Vitals:   04/28/24 0836  PainSc: 0-No pain         Complications: No notable events documented.

## 2024-04-28 NOTE — Anesthesia Preprocedure Evaluation (Signed)
 Anesthesia Evaluation  Patient identified by MRN, date of birth, ID band Patient awake    Reviewed: Allergy & Precautions, NPO status , Patient's Chart, lab work & pertinent test results  History of Anesthesia Complications Negative for: history of anesthetic complications  Airway Mallampati: III  TM Distance: >3 FB Neck ROM: full    Dental  (+) Chipped, Poor Dentition, Missing, Loose   Pulmonary COPD, former smoker   Pulmonary exam normal        Cardiovascular Exercise Tolerance: Good hypertension, (-) angina (-) Past MI Normal cardiovascular exam     Neuro/Psych TIA negative psych ROS   GI/Hepatic negative GI ROS, Neg liver ROS,,,  Endo/Other  diabetes    Renal/GU negative Renal ROS  negative genitourinary   Musculoskeletal   Abdominal   Peds  Hematology negative hematology ROS (+)   Anesthesia Other Findings Past Medical History: 02/21/2024: Acute sepsis (HCC) No date: Diabetes mellitus without complication (HCC) No date: DVT (deep venous thrombosis) (HCC) No date: HTN (hypertension) No date: Leukocytosis No date: PE (pulmonary thromboembolism) (HCC) No date: Tobacco abuse  Past Surgical History: 02/22/2024: IR PERC CHOLECYSTOSTOMY 04/15/2024: IR RADIOLOGIST EVAL & MGMT No date: left shoulder surgery; Left     Reproductive/Obstetrics negative OB ROS                              Anesthesia Physical Anesthesia Plan  ASA: 2  Anesthesia Plan: General   Post-op Pain Management:    Induction: Intravenous  PONV Risk Score and Plan: Propofol  infusion and TIVA  Airway Management Planned: Natural Airway and Nasal Cannula  Additional Equipment:   Intra-op Plan:   Post-operative Plan:   Informed Consent: I have reviewed the patients History and Physical, chart, labs and discussed the procedure including the risks, benefits and alternatives for the proposed anesthesia with  the patient or authorized representative who has indicated his/her understanding and acceptance.     Dental Advisory Given  Plan Discussed with: Anesthesiologist, CRNA and Surgeon  Anesthesia Plan Comments: (Patient consented for risks of anesthesia including but not limited to:  - adverse reactions to medications - risk of airway placement if required - damage to eyes, teeth, lips or other oral mucosa - nerve damage due to positioning  - sore throat or hoarseness - Damage to heart, brain, nerves, lungs, other parts of body or loss of life  Patient voiced understanding and assent.)        Anesthesia Quick Evaluation

## 2024-04-28 NOTE — Op Note (Signed)
 [facility] Gastroenterology Patient Name: Douglas Smith Procedure Date: 04/28/2024 9:31 AM MRN: [id number] Account #: [id number] Date of Birth: [birth year] Admit Type: Outpatient Age: 73 Room: [facility] ENDO ROOM 4 Gender: Male Note Status: Finalized Instrument Name: [id number] Procedure:             ERCP Indications:           Common bile duct stone(s) Providers:             Rogelia Copping MD, MD Referring MD:          [facility] (Referring MD) Medicines:             Propofol  per Anesthesia Complications:         No immediate complications. Procedure:             Pre-Anesthesia Assessment:                        - Prior to the procedure, a History and Physical was                         performed, and patient medications and allergies were                         reviewed. The patient's tolerance of previous                         anesthesia was also reviewed. The risks and benefits                         of the procedure and the sedation options and risks                         were discussed with the patient. All questions were                         answered, and informed consent was obtained. Prior                         Anticoagulants: The patient has taken no anticoagulant                         or antiplatelet agents. ASA Grade Assessment: II - A                         patient with mild systemic disease. After reviewing                         the risks and benefits, the patient was deemed in                         satisfactory condition to undergo the procedure.                        After obtaining informed consent, the scope was passed                         under direct vision. Throughout the procedure, the  patient's blood pressure, pulse, and oxygen                         saturations were monitored continuously. The                         Duodenoscope was introduced through the mouth, and                          used to inject contrast into and used to inject                         contrast into the bile duct. The ERCP was accomplished                         without difficulty. The patient tolerated the                         procedure well. Findings:      A scout film of the abdomen was obtained. One percutaneous drain ending       in the gallbladder was seen. The esophagus was successfully intubated       under direct vision. The scope was advanced to a normal major papilla in       the descending duodenum without detailed examination of the pharynx,       larynx and associated structures, and upper GI tract. The upper GI tract       was grossly normal. The bile duct was deeply cannulated with the       short-nosed traction sphincterotome. Contrast was injected. I personally       interpreted the bile duct images. There was brisk flow of contrast       through the ducts. Image quality was excellent. Contrast extended to the       entire biliary tree. The middle third of the main bile duct contained       multiple stones, the largest of which was small in diameter. A wire was       passed into the biliary tree. A 7 mm biliary sphincterotomy was made       with a traction (standard) sphincterotome using ERBE electrocautery.       There was no post-sphincterotomy bleeding. The biliary tree was swept       with a 15 mm balloon starting at the bifurcation. A few stones were       removed. All stones remained. A gastric polyps was seen. Impression:            - Choledocholithiasis was found. Removal was not                         accomplished; no stent was inserted.                        - A biliary sphincterotomy was performed.                        - The biliary tree was swept. Recommendation:        - Discharge patient to home.                        -  Clear liquid diet today.                        - Return to referring physician.                        - Watch for pancreatitis,  bleeding, perforation, and                         cholangitis.                        - epe                        - EGD for evaluation of gastric polyp in 1 month. Procedure Code(s):     --- Professional ---                        [phone], Endoscopic retrograde cholangiopancreatography                         (ERCP); with removal of calculi/debris from                         biliary/pancreatic duct(s)                        43262, Endoscopic retrograde cholangiopancreatography                         (ERCP); with sphincterotomy/papillotomy                        [phone], Endoscopic catheterization of the biliary                         ductal system, radiological supervision and                         interpretation Diagnosis Code(s):     --- Professional ---                        K80.50, Calculus of bile duct without cholangitis or                         cholecystitis without obstruction CPT copyright 2022 American Medical Association. All rights reserved. The codes documented in this report are preliminary and upon coder review may  be revised to meet current compliance requirements. Rogelia Copping MD, MD 04/28/2024 9:53:10 AM This report has been signed electronically. Number of Addenda: 0 Note Initiated On: 04/28/2024 9:31 AM Estimated Blood Loss:  Estimated blood loss: none.      [facility]

## 2024-04-28 NOTE — H&P (Signed)
 Rogelia Copping, MD [facility] [address] [place], [place] [address] Phone:[phone] Fax : [phone]  Primary Care Physician:  [facility] Primary Gastroenterologist:  Dr. Copping  Pre-Procedure History & Physical: HPI:  Douglas Smith is a 73 y.o. male is here for an ERCP.   Past Medical History:  Diagnosis Date   Acute sepsis (HCC) 02/21/2024   Diabetes mellitus without complication (HCC)    DVT (deep venous thrombosis) (HCC)    HTN (hypertension)    Leukocytosis    PE (pulmonary thromboembolism) (HCC)    Tobacco abuse     Past Surgical History:  Procedure Laterality Date   IR PERC CHOLECYSTOSTOMY  02/22/2024   IR RADIOLOGIST EVAL & MGMT  04/15/2024   left shoulder surgery Left     Prior to Admission medications   Medication Sig Start Date End Date Taking? Authorizing Provider  albuterol  (PROVENTIL ) (2.5 MG/3ML) 0.083% nebulizer solution Take 3 mLs (2.5 mg total) by nebulization every 4 (four) hours as needed for wheezing or shortness of breath. 10/12/21  Yes Robinette Vermell PARAS, MD  aspirin  EC 81 MG tablet Take 81 mg by mouth daily. Swallow whole.   Yes [provider]  atorvastatin  (LIPITOR) 20 MG tablet Take 20 mg by mouth daily. 01/10/24  Yes [provider]  empagliflozin  (JARDIANCE ) 25 MG TABS tablet Take 12.5 mg by mouth daily. 08/15/23  Yes [provider]  losartan  (COZAAR ) 50 MG tablet Take 25 mg by mouth daily. 08/15/23  Yes [provider]  Multiple Vitamins-Minerals (PRESERVISION AREDS) TABS Take 1 tablet by mouth 2 (two) times daily.   Yes [provider]  naproxen sodium (ALEVE) 220 MG tablet Take 220 mg by mouth 2 (two) times daily as needed (Pain).   Yes [provider]  Nebulizers (COMPRESSOR/NEBULIZER) MISC 1 Units by Does not apply route every 4 (four) hours as needed. 10/12/21  Yes Robinette Vermell PARAS, MD  omeprazole  (PRILOSEC ) 20 MG capsule Take 20 mg by mouth daily.   Yes [provider]   oxyCODONE -acetaminophen  (PERCOCET) 5-325 MG tablet Take 1 tablet by mouth every 4 (four) hours as needed for severe pain (pain score 7-10). 04/16/24  Yes Bradler, Evan K, MD  sodium chloride  flush 0.9 % SOLN injection Flush the drain with 5 mL of normal saline every day. 04/11/24  Yes Lane Shope, MD  apixaban  (ELIQUIS ) 5 MG TABS tablet Take 1 tablet (5 mg total) by mouth 2 (two) times daily. 04/28/21 04/17/24  Britta Lacks, MD    Allergies as of 04/17/2024   (No Known Allergies)    Family History  Problem Relation Age of Onset   Stroke Brother     Social History   Socioeconomic History   Marital status: Single    Spouse name: Not on file   Number of children: Not on file   Years of education: Not on file   Highest education level: Not on file  Occupational History   Not on file  Tobacco Use   Smoking status: Former    Types: Cigarettes   Smokeless tobacco: Never  Vaping Use   Vaping status: Never Used  Substance and Sexual Activity   Alcohol use: Not Currently   Drug use: Never   Sexual activity: Not on file  Other Topics Concern   Not on file  Social History Narrative   Not on file   Social Drivers of Health   Financial Resource Strain: Not on file  Food Insecurity: Patient Declined (02/21/2024)  Hunger Vital Sign    Worried About Running Out of Food in the Last Year: Patient declined    Ran Out of Food in the Last Year: Patient declined  Transportation Needs: Patient Declined (02/21/2024)   PRAPARE - Administrator, Civil Service (Medical): Patient declined    Lack of Transportation (Non-Medical): Patient declined  Physical Activity: Not on file  Stress: Not on file  Social Connections: Patient Declined (02/21/2024)   Social Connection and Isolation Panel    Frequency of Communication with Friends and Family: Patient declined    Frequency of Social Gatherings with Friends and Family: Patient declined    Attends Religious Services: Patient declined     Database administrator or Organizations: Patient declined    Attends Banker Meetings: Patient declined    Marital Status: Patient declined  Intimate Partner Violence: Patient Declined (02/21/2024)   Humiliation, Afraid, Rape, and Kick questionnaire    Fear of Current or Ex-Partner: Patient declined    Emotionally Abused: Patient declined    Physically Abused: Patient declined    Sexually Abused: Patient declined    Review of Systems: See HPI, otherwise negative ROS  Physical Exam: BP (!) 120/97   Pulse 94   Temp (!) 96.5 F (35.8 C)   Resp 20   Ht 6' (1.829 m)   Wt 81.7 kg   SpO2 98%   BMI 24.44 kg/m  General:   Alert,  pleasant and cooperative in NAD Head:  Normocephalic and atraumatic. Neck:  Supple; no masses or thyromegaly. Lungs:  Clear throughout to auscultation.    Heart:  Regular rate and rhythm. Abdomen:  Soft, nontender and nondistended. Normal bowel sounds, without guarding, and without rebound.   Neurologic:  Alert and  oriented x4;  grossly normal neurologically.  Impression/Plan: Douglas Smith is here for an ERCP to be performed for CBD stone  Risks, benefits, limitations, and alternatives regarding  ERCP have been reviewed with the patient.  Questions have been answered.  All parties agreeable.   Rogelia Copping, MD  04/28/2024, 9:03 AM

## 2024-04-28 NOTE — Telephone Encounter (Signed)
 EGD 1 mth gastric polyp

## 2024-04-28 NOTE — Anesthesia Postprocedure Evaluation (Signed)
 Anesthesia Post Note  Patient: Douglas Smith  Procedure(s) Performed: ERCP, WITH INTERVENTION IF INDICATED  Patient location during evaluation: Endoscopy Anesthesia Type: General Level of consciousness: awake and alert Pain management: pain level controlled Vital Signs Assessment: post-procedure vital signs reviewed and stable Respiratory status: spontaneous breathing, nonlabored ventilation and respiratory function stable Cardiovascular status: blood pressure returned to baseline and stable Postop Assessment: no apparent nausea or vomiting Anesthetic complications: no   No notable events documented.   Last Vitals:  Vitals:   04/28/24 0956 04/28/24 1006  BP: (!) 144/98 (!) 141/98  Pulse: 96 84  Resp: 20 11  Temp: (!) 35.6 C   SpO2: 100% 97%    Last Pain:  Vitals:   04/28/24 1006  PainSc: 0-No pain                 Fairy POUR Pascuala Klutts

## 2024-05-01 ENCOUNTER — Telehealth: Payer: Self-pay | Admitting: Surgery

## 2024-05-01 NOTE — Telephone Encounter (Signed)
 Patient and daughter, Lorelie have been advised of Pre-Admission date/time, and Surgery date at [facility].  Surgery Date: 05/07/24 Preadmission Testing Date: 05/05/24 (phone 8a-1p)  Patient and daughter informed of the scheduling process and surgery information given at time of office visit.   Patient has been made aware to call [phone], between 1-3:00pm the day before surgery, to find out what time to arrive for surgery.

## 2024-05-04 ENCOUNTER — Ambulatory Visit: Payer: Self-pay | Admitting: Surgery

## 2024-05-04 DIAGNOSIS — K8064 Calculus of gallbladder and bile duct with chronic cholecystitis without obstruction: Secondary | ICD-10-CM

## 2024-05-05 ENCOUNTER — Other Ambulatory Visit: Payer: Self-pay

## 2024-05-05 ENCOUNTER — Encounter
Admission: RE | Admit: 2024-05-05 | Discharge: 2024-05-05 | Disposition: A | Discharge status: Home / Self Care | Source: Ambulatory Visit | Attending: Surgery | Admitting: Surgery

## 2024-05-05 DIAGNOSIS — Z01818 Encounter for other preprocedural examination: Secondary | ICD-10-CM

## 2024-05-05 HISTORY — DX: Transient cerebral ischemic attack, unspecified: G45.9

## 2024-05-05 HISTORY — DX: Nontoxic single thyroid nodule: E04.1

## 2024-05-05 HISTORY — DX: Calculus of gallbladder with chronic cholecystitis without obstruction: K80.10

## 2024-05-05 HISTORY — DX: Personal history of nicotine dependence: Z87.891

## 2024-05-05 HISTORY — DX: Solitary pulmonary nodule: R91.1

## 2024-05-05 HISTORY — DX: Anemia, unspecified: D64.9

## 2024-05-05 NOTE — Patient Instructions (Addendum)
 Your procedure is scheduled on:05-07-24 Wednesday Report to the Registration Desk on the 1st floor of the Medical Mall.Then proceed to the 2nd floor Surgery Desk To find out your arrival time, please call [phone] between 1PM - 3PM on:05-06-24 Tuesday If your arrival time is 6:00 am, do not arrive before that time as the Medical Mall entrance doors do not open until 6:00 am.  REMEMBER: Instructions that are not followed completely may result in serious medical risk, up to and including death; or upon the discretion of your surgeon and anesthesiologist your surgery may need to be rescheduled.  Do not eat food OR drink liquids after midnight the night before surgery.  No gum chewing or hard candies.  One week prior to surgery:Stop NOW (05-05-24) Stop Anti-inflammatories (NSAIDS) such as Advil, Aleve, Ibuprofen, Motrin, Naproxen, Naprosyn and Aspirin  based products such as Excedrin, Goody's Powder, BC Powder. Stop ANY OVER THE COUNTER supplements until after surgery.  You may however, continue to take Tylenol  if needed for pain up until the day of surgery.  Stop empagliflozin  (JARDIANCE ) NOW (05-05-24)  Continue taking all of your other prescription medications up until the day of surgery.  ON THE DAY OF SURGERY ONLY TAKE THESE MEDICATIONS WITH SIPS OF WATER: -atorvastatin  (LIPITOR)  -omeprazole  (PRILOSEC )   Dr Ane office will call you today (05-05-24) with instructions regarding apixaban  (ELIQUIS )-Follow surgeon's instructions  No Alcohol for 24 hours before or after surgery.  No Smoking including e-cigarettes for 24 hours before surgery.  No chewable tobacco products for at least 6 hours before surgery.  No nicotine  patches on the day of surgery.  Do not use any recreational drugs for at least a week (preferably 2 weeks) before your surgery.  Please be advised that the combination of cocaine and anesthesia may have negative outcomes, up to and including death. If you test  positive for cocaine, your surgery will be cancelled.  On the morning of surgery brush your teeth with toothpaste and water, you may rinse your mouth with mouthwash if you wish. Do not swallow any toothpaste or mouthwash.  Use CHG wipes as directed on instruction sheet.  Do not wear jewelry, make-up, hairpins, clips or nail polish.  For welded (permanent) jewelry: bracelets, anklets, waist bands, etc.  Please have this removed prior to surgery.  If it is not removed, there is a chance that hospital personnel will need to cut it off on the day of surgery.  Do not wear lotions, powders, or perfumes.   Do not shave body hair from the neck down 48 hours before surgery.  Contact lenses, hearing aids and dentures may not be worn into surgery.  Do not bring valuables to the hospital. [facility] is not responsible for any missing/lost belongings or valuables.   Notify your doctor if there is any change in your medical condition (cold, fever, infection).  Wear comfortable clothing (specific to your surgery type) to the hospital.  After surgery, you can help prevent lung complications by doing breathing exercises.  Take deep breaths and cough every 1-2 hours. Your doctor may order a device called an Incentive Spirometer to help you take deep breaths. When coughing or sneezing, hold a pillow firmly against your incision with both hands. This is called "splinting." Doing this helps protect your incision. It also decreases belly discomfort.  If you are being admitted to the hospital overnight, leave your suitcase in the car. After surgery it may be brought to your room.  In case of  increased patient census, it may be necessary for you, the patient, to continue your postoperative care in the Same Day Surgery department.  If you are being discharged the day of surgery, you will not be allowed to drive home. You will need a responsible individual to drive you home and stay with you for 24 hours  after surgery.   If you are taking public transportation, you will need to have a responsible individual with you.  Please call the Pre-admissions Testing Dept. at [phone] if you have any questions about these instructions.  Surgery Visitation Policy:  Patients having surgery or a procedure may have two visitors.  Children under the age of 31 must have an adult with them who is not the patient.  Preparing the Skin Before Surgery     To help prevent the risk of infection at your surgical site, we are now providing you with rinse-free Sage 2% Chlorhexidine  Gluconate (CHG) disposable wipes.  Chlorhexidine  Gluconate (CHG) Soap  o An antiseptic cleaner that kills germs and bonds with the skin to continue killing germs even after washing  o Used for showering the night before surgery and morning of surgery  The night before surgery: Shower or bathe with warm water. Do not apply perfume, lotions, powders. Wait one hour after shower. Skin should be dry and cool. Open Sage wipe package - use 6 disposable cloths. Wipe body using one cloth for the right arm, one cloth for the left arm, one cloth for the right leg, one cloth for the left leg, one cloth for the chest/abdomen area, and one cloth for the back. Do not use on open wounds or sores. Do not use on face or genitals (private parts). If you are breast feeding, do not use on breasts. 5. Do not rinse, allow to dry. 6. Skin may feel tacky for several minutes. 7. Dress in clean clothes. 8. Place clean sheets on your bed and do not sleep with pets.  REPEAT ABOVE ON THE MORNING OF SURGERY BEFORE ARRIVING TO THE HOSPITAL.    Merchandiser, retail to address health-related social needs:  [email or web address]

## 2024-05-07 ENCOUNTER — Ambulatory Visit

## 2024-05-07 ENCOUNTER — Other Ambulatory Visit: Payer: Self-pay

## 2024-05-07 ENCOUNTER — Encounter: Payer: Self-pay | Admitting: Surgery

## 2024-05-07 ENCOUNTER — Ambulatory Visit
Admission: RE | Admit: 2024-05-07 | Discharge: 2024-05-07 | Disposition: A | Discharge status: Home / Self Care | Attending: Surgery | Admitting: Surgery

## 2024-05-07 ENCOUNTER — Encounter
Admission: RE | Disposition: A | Discharge status: Home / Self Care | Payer: Self-pay | Source: Home / Self Care | Attending: Surgery

## 2024-05-07 DIAGNOSIS — Z86718 Personal history of other venous thrombosis and embolism: Secondary | ICD-10-CM | POA: Diagnosis not present

## 2024-05-07 DIAGNOSIS — Z86711 Personal history of pulmonary embolism: Secondary | ICD-10-CM | POA: Insufficient documentation

## 2024-05-07 DIAGNOSIS — K8012 Calculus of gallbladder with acute and chronic cholecystitis without obstruction: Secondary | ICD-10-CM | POA: Insufficient documentation

## 2024-05-07 DIAGNOSIS — K8064 Calculus of gallbladder and bile duct with chronic cholecystitis without obstruction: Secondary | ICD-10-CM

## 2024-05-07 DIAGNOSIS — J449 Chronic obstructive pulmonary disease, unspecified: Secondary | ICD-10-CM | POA: Diagnosis not present

## 2024-05-07 DIAGNOSIS — Z01818 Encounter for other preprocedural examination: Secondary | ICD-10-CM

## 2024-05-07 DIAGNOSIS — Z9889 Other specified postprocedural states: Secondary | ICD-10-CM

## 2024-05-07 DIAGNOSIS — R000 Tachycardia, unspecified: Secondary | ICD-10-CM | POA: Diagnosis not present

## 2024-05-07 DIAGNOSIS — I10 Essential (primary) hypertension: Secondary | ICD-10-CM | POA: Diagnosis not present

## 2024-05-07 DIAGNOSIS — K8010 Calculus of gallbladder with chronic cholecystitis without obstruction: Secondary | ICD-10-CM | POA: Diagnosis not present

## 2024-05-07 DIAGNOSIS — I452 Bifascicular block: Secondary | ICD-10-CM | POA: Diagnosis not present

## 2024-05-07 DIAGNOSIS — E119 Type 2 diabetes mellitus without complications: Secondary | ICD-10-CM | POA: Diagnosis not present

## 2024-05-07 DIAGNOSIS — D649 Anemia, unspecified: Secondary | ICD-10-CM | POA: Diagnosis not present

## 2024-05-07 DIAGNOSIS — Z87891 Personal history of nicotine dependence: Secondary | ICD-10-CM | POA: Diagnosis not present

## 2024-05-07 DIAGNOSIS — K828 Other specified diseases of gallbladder: Secondary | ICD-10-CM | POA: Insufficient documentation

## 2024-05-07 DIAGNOSIS — Z8673 Personal history of transient ischemic attack (TIA), and cerebral infarction without residual deficits: Secondary | ICD-10-CM | POA: Insufficient documentation

## 2024-05-07 HISTORY — PX: INDOCYANINE GREEN FLUORESCENCE IMAGING (ICG): SHX7595

## 2024-05-07 HISTORY — PX: LAPAROSCOPIC LYSIS OF ADHESIONS: SHX5905

## 2024-05-07 LAB — GLUCOSE, CAPILLARY
Glucose-Capillary: 176 mg/dL — ABNORMAL HIGH (ref 70–99)
Glucose-Capillary: 178 mg/dL — ABNORMAL HIGH (ref 70–99)

## 2024-05-07 SURGERY — CHOLECYSTECTOMY, ROBOT-ASSISTED, LAPAROSCOPIC
Anesthesia: General

## 2024-05-07 MED ORDER — SUGAMMADEX SODIUM 200 MG/2ML IV SOLN
INTRAVENOUS | Status: DC | PRN
  Administered 2024-05-07: 200 mg via INTRAVENOUS

## 2024-05-07 MED ORDER — BUPIVACAINE LIPOSOME 1.3 % IJ SUSP: 20.0000 mL | Freq: Once | INTRAMUSCULAR | Status: DC

## 2024-05-07 MED ORDER — INDOCYANINE GREEN 25 MG IV SOLR
1.2500 mg | Freq: Once | INTRAVENOUS | Status: AC
  Administered 2024-05-07: 1.25 mg via INTRAVENOUS

## 2024-05-07 MED ORDER — INDOCYANINE GREEN 25 MG IV SOLR
INTRAVENOUS | Status: AC
  Filled 2024-05-07: qty 10

## 2024-05-07 MED ORDER — SODIUM CHLORIDE 0.9 % IV SOLN: INTRAVENOUS | Status: DC

## 2024-05-07 MED ORDER — CHLORHEXIDINE GLUCONATE CLOTH 2 % EX PADS: 6.0000 | MEDICATED_PAD | Freq: Once | CUTANEOUS | Status: DC

## 2024-05-07 MED ORDER — FENTANYL CITRATE (PF) 100 MCG/2ML IJ SOLN
INTRAMUSCULAR | Status: AC
  Filled 2024-05-07: qty 2

## 2024-05-07 MED ORDER — MIDAZOLAM HCL 2 MG/2ML IJ SOLN
INTRAMUSCULAR | Status: AC
  Filled 2024-05-07: qty 2

## 2024-05-07 MED ORDER — PHENYLEPHRINE HCL-NACL 20-0.9 MG/250ML-% IV SOLN
INTRAVENOUS | Status: AC
  Filled 2024-05-07: qty 250

## 2024-05-07 MED ORDER — CELECOXIB 200 MG PO CAPS
ORAL_CAPSULE | ORAL | Status: AC
  Filled 2024-05-07: qty 1

## 2024-05-07 MED ORDER — PHENYLEPHRINE 80 MCG/ML (10ML) SYRINGE FOR IV PUSH (FOR BLOOD PRESSURE SUPPORT)
PREFILLED_SYRINGE | INTRAVENOUS | Status: DC | PRN
  Administered 2024-05-07: 40 ug via INTRAVENOUS
  Administered 2024-05-07 (×4): 80 ug via INTRAVENOUS
  Administered 2024-05-07: 160 ug via INTRAVENOUS
  Administered 2024-05-07 (×2): 80 ug via INTRAVENOUS

## 2024-05-07 MED ORDER — PHENYLEPHRINE 80 MCG/ML (10ML) SYRINGE FOR IV PUSH (FOR BLOOD PRESSURE SUPPORT)
PREFILLED_SYRINGE | INTRAVENOUS | Status: AC
  Filled 2024-05-07: qty 20

## 2024-05-07 MED ORDER — DEXAMETHASONE SODIUM PHOSPHATE 10 MG/ML IJ SOLN
INTRAMUSCULAR | Status: DC | PRN
Start: 2024-05-07 — End: 2024-05-07
  Administered 2024-05-07: 4 mg via INTRAVENOUS

## 2024-05-07 MED ORDER — FENTANYL CITRATE (PF) 100 MCG/2ML IJ SOLN
25.0000 ug | INTRAMUSCULAR | Status: DC | PRN
  Administered 2024-05-07 (×3): 50 ug via INTRAVENOUS

## 2024-05-07 MED ORDER — BUPIVACAINE-EPINEPHRINE (PF) 0.25% -1:200000 IJ SOLN
INTRAMUSCULAR | Status: DC | PRN
  Administered 2024-05-07: 30 mL

## 2024-05-07 MED ORDER — CEFAZOLIN SODIUM-DEXTROSE 2-4 GM/100ML-% IV SOLN
INTRAVENOUS | Status: AC
  Filled 2024-05-07: qty 100

## 2024-05-07 MED ORDER — ROCURONIUM BROMIDE 10 MG/ML (PF) SYRINGE
PREFILLED_SYRINGE | INTRAVENOUS | Status: AC
  Filled 2024-05-07: qty 10

## 2024-05-07 MED ORDER — FENTANYL CITRATE (PF) 100 MCG/2ML IJ SOLN
INTRAMUSCULAR | Status: DC | PRN
  Administered 2024-05-07 (×2): 50 ug via INTRAVENOUS

## 2024-05-07 MED ORDER — LIDOCAINE HCL (PF) 2 % IJ SOLN
INTRAMUSCULAR | Status: AC
Start: 2024-05-07 — End: 2024-05-07
  Filled 2024-05-07: qty 5

## 2024-05-07 MED ORDER — GABAPENTIN 300 MG PO CAPS
ORAL_CAPSULE | ORAL | Status: AC
Start: 2024-05-07 — End: 2024-05-07
  Filled 2024-05-07: qty 1

## 2024-05-07 MED ORDER — PROPOFOL 10 MG/ML IV BOLUS
INTRAVENOUS | Status: DC | PRN
  Administered 2024-05-07: 150 mg via INTRAVENOUS

## 2024-05-07 MED ORDER — OXYCODONE HCL 5 MG PO TABS
5.0000 mg | ORAL_TABLET | Freq: Once | ORAL | Status: AC | PRN
  Administered 2024-05-07: 5 mg via ORAL

## 2024-05-07 MED ORDER — GABAPENTIN 300 MG PO CAPS
300.0000 mg | ORAL_CAPSULE | ORAL | Status: AC
  Administered 2024-05-07: 300 mg via ORAL

## 2024-05-07 MED ORDER — CHLORHEXIDINE GLUCONATE 0.12 % MT SOLN: 15.0000 mL | Freq: Once | OROMUCOSAL | Status: DC

## 2024-05-07 MED ORDER — PROPOFOL 10 MG/ML IV BOLUS
INTRAVENOUS | Status: AC
Start: 2024-05-07 — End: 2024-05-07
  Filled 2024-05-07: qty 20

## 2024-05-07 MED ORDER — SODIUM CHLORIDE 0.9 % IR SOLN
Status: DC | PRN
  Administered 2024-05-07: 1000 mL

## 2024-05-07 MED ORDER — MIDAZOLAM HCL 2 MG/2ML IJ SOLN
INTRAMUSCULAR | Status: DC | PRN
  Administered 2024-05-07 (×2): 1 mg via INTRAVENOUS

## 2024-05-07 MED ORDER — CEFAZOLIN SODIUM-DEXTROSE 2-4 GM/100ML-% IV SOLN
2.0000 g | INTRAVENOUS | Status: AC
  Administered 2024-05-07: 2 g via INTRAVENOUS

## 2024-05-07 MED ORDER — OXYCODONE HCL 5 MG PO TABS
ORAL_TABLET | ORAL | Status: AC
  Filled 2024-05-07: qty 1

## 2024-05-07 MED ORDER — BUPIVACAINE-EPINEPHRINE (PF) 0.25% -1:200000 IJ SOLN
INTRAMUSCULAR | Status: AC
  Filled 2024-05-07: qty 30

## 2024-05-07 MED ORDER — ACETAMINOPHEN 10 MG/ML IV SOLN
1000.0000 mg | Freq: Once | INTRAVENOUS | Status: DC | PRN
Start: 2024-05-07 — End: 2024-05-07

## 2024-05-07 MED ORDER — ROCURONIUM BROMIDE 100 MG/10ML IV SOLN
INTRAVENOUS | Status: DC | PRN
  Administered 2024-05-07 (×2): 20 mg via INTRAVENOUS
  Administered 2024-05-07: 50 mg via INTRAVENOUS
  Administered 2024-05-07: 10 mg via INTRAVENOUS

## 2024-05-07 MED ORDER — LACTATED RINGERS IV SOLN
INTRAVENOUS | Status: DC | PRN
Start: 2024-05-07 — End: 2024-05-07

## 2024-05-07 MED ORDER — HYDROCODONE-ACETAMINOPHEN 5-325 MG PO TABS: 1.0000 | ORAL_TABLET | Freq: Four times a day (QID) | ORAL | 0 refills | Status: AC | PRN

## 2024-05-07 MED ORDER — ONDANSETRON HCL 4 MG/2ML IJ SOLN: 4.0000 mg | Freq: Once | INTRAMUSCULAR | Status: DC | PRN

## 2024-05-07 MED ORDER — CHLORHEXIDINE GLUCONATE 0.12 % MT SOLN
OROMUCOSAL | Status: AC
  Filled 2024-05-07: qty 15

## 2024-05-07 MED ORDER — LACTATED RINGERS IV SOLN: INTRAVENOUS | Status: DC

## 2024-05-07 MED ORDER — ACETAMINOPHEN 500 MG PO TABS
1000.0000 mg | ORAL_TABLET | ORAL | Status: AC
  Administered 2024-05-07: 1000 mg via ORAL

## 2024-05-07 MED ORDER — ORAL CARE MOUTH RINSE: 15.0000 mL | Freq: Once | OROMUCOSAL | Status: DC

## 2024-05-07 MED ORDER — FENTANYL CITRATE (PF) 100 MCG/2ML IJ SOLN
INTRAMUSCULAR | Status: AC
Start: 2024-05-07 — End: 2024-05-07
  Filled 2024-05-07: qty 2

## 2024-05-07 MED ORDER — ACETAMINOPHEN 500 MG PO TABS
ORAL_TABLET | ORAL | Status: AC
  Filled 2024-05-07: qty 2

## 2024-05-07 MED ORDER — ONDANSETRON HCL 4 MG/2ML IJ SOLN
INTRAMUSCULAR | Status: DC | PRN
Start: 2024-05-07 — End: 2024-05-07
  Administered 2024-05-07: 4 mg via INTRAVENOUS

## 2024-05-07 MED ORDER — PHENYLEPHRINE HCL-NACL 20-0.9 MG/250ML-% IV SOLN
INTRAVENOUS | Status: DC | PRN
Start: 2024-05-07 — End: 2024-05-07
  Administered 2024-05-07: 40 ug/min via INTRAVENOUS

## 2024-05-07 MED ORDER — LIDOCAINE HCL (CARDIAC) PF 100 MG/5ML IV SOSY
PREFILLED_SYRINGE | INTRAVENOUS | Status: DC | PRN
  Administered 2024-05-07: 60 mg via INTRAVENOUS

## 2024-05-07 MED ORDER — CELECOXIB 200 MG PO CAPS: 200.0000 mg | ORAL_CAPSULE | ORAL | Status: DC

## 2024-05-07 MED ORDER — OXYCODONE HCL 5 MG/5ML PO SOLN: 5.0000 mg | Freq: Once | ORAL | Status: AC | PRN

## 2024-05-07 MED ORDER — HYDRALAZINE HCL 20 MG/ML IJ SOLN
INTRAMUSCULAR | Status: DC | PRN
  Administered 2024-05-07: 5 mg via INTRAVENOUS

## 2024-05-07 SURGICAL SUPPLY — 38 items
BAG PRESSURE INF REUSE 3000 (BAG) IMPLANT
CLIP LIGATING HEM O LOK PURPLE (MISCELLANEOUS) ×2 IMPLANT
COVER TIP SHEARS 8 DVNC (MISCELLANEOUS) ×2 IMPLANT
DEFOGGER SCOPE WARM SEASHARP (MISCELLANEOUS) ×2 IMPLANT
DERMABOND ADVANCED .7 DNX12 (GAUZE/BANDAGES/DRESSINGS) ×2 IMPLANT
DRAPE ARM DVNC X/XI (DISPOSABLE) ×8 IMPLANT
DRAPE COLUMN DVNC XI (DISPOSABLE) ×2 IMPLANT
FORCEPS BPLR R/ABLATION 8 DVNC (INSTRUMENTS) ×2 IMPLANT
FORCEPS PROGRASP DVNC XI (FORCEP) ×2 IMPLANT
GLOVE ORTHO TXT STRL SZ7.5 (GLOVE) ×4 IMPLANT
GOWN STRL REUS W/ TWL LRG LVL3 (GOWN DISPOSABLE) ×4 IMPLANT
GOWN STRL REUS W/ TWL XL LVL3 (GOWN DISPOSABLE) ×4 IMPLANT
GRASPER SUT TROCAR 14GX15 (MISCELLANEOUS) ×2 IMPLANT
IRRIGATION STRYKERFLOW (MISCELLANEOUS) IMPLANT
IRRIGATOR SUCT 8 DISP DVNC XI (IRRIGATION / IRRIGATOR) IMPLANT
KIT PINK PAD W/HEAD ARM REST (MISCELLANEOUS) ×2 IMPLANT
KIT TURNOVER KIT A (KITS) ×2 IMPLANT
LABEL OR SOLS (LABEL) ×2 IMPLANT
MANIFOLD NEPTUNE II (INSTRUMENTS) ×2 IMPLANT
NDL HYPO 22X1.5 SAFETY MO (MISCELLANEOUS) ×2 IMPLANT
NDL INSUFFLATION 14GA 120MM (NEEDLE) ×2 IMPLANT
NEEDLE HYPO 22X1.5 SAFETY MO (MISCELLANEOUS) ×2 IMPLANT
NEEDLE INSUFFLATION 14GA 120MM (NEEDLE) ×2 IMPLANT
NS IRRIG 500ML POUR BTL (IV SOLUTION) ×2 IMPLANT
PACK LAP CHOLECYSTECTOMY (MISCELLANEOUS) ×2 IMPLANT
SCISSORS MNPLR CVD DVNC XI (INSTRUMENTS) ×2 IMPLANT
SEAL UNIV 5-12 XI (MISCELLANEOUS) ×8 IMPLANT
SET TUBE SMOKE EVAC HIGH FLOW (TUBING) ×2 IMPLANT
SOL .9 NS 3000ML IRR UROMATIC (IV SOLUTION) IMPLANT
SOLUTION ELECTROSURG ANTI STCK (MISCELLANEOUS) ×2 IMPLANT
SPIKE FLUID TRANSFER (MISCELLANEOUS) ×2 IMPLANT
SUT VICRYL 0 UR6 27IN ABS (SUTURE) ×2 IMPLANT
SUTURE MNCRL 4-0 27XMF (SUTURE) ×2 IMPLANT
SYRINGE TOOMEY IRRIG 70ML (MISCELLANEOUS) IMPLANT
SYSTEM BAG RETRIEVAL 10MM (BASKET) ×2 IMPLANT
TRAP FLUID SMOKE EVACUATOR (MISCELLANEOUS) ×2 IMPLANT
TROCAR Z-THREAD FIOS 11X100 BL (TROCAR) ×2 IMPLANT
WATER STERILE IRR 500ML POUR (IV SOLUTION) ×2 IMPLANT

## 2024-05-07 NOTE — Anesthesia Procedure Notes (Signed)
 Procedure Name: Intubation Date/Time: 05/07/2024 7:35 AM  Performed by: Viviana Lacks, RNPre-anesthesia Checklist: Patient identified, Patient being monitored, Timeout performed, Emergency Drugs available and Suction available Patient Re-evaluated:Patient Re-evaluated prior to induction Oxygen Delivery Method: Circle system utilized Preoxygenation: Pre-oxygenation with 100% oxygen Induction Type: IV induction Ventilation: Mask ventilation without difficulty Laryngoscope Size: McGrath and 4 Grade View: Grade I Tube type: Oral Tube size: 7.5 mm Number of attempts: 1 Airway Equipment and Method: Stylet Placement Confirmation: ETT inserted through vocal cords under direct vision, positive ETCO2 and breath sounds checked- equal and bilateral Secured at: 21 cm Tube secured with: Tape Dental Injury: Teeth and Oropharynx as per pre-operative assessment

## 2024-05-07 NOTE — Op Note (Signed)
 Robotic cholecystectomy with Indocyamine Green Ductal Imaging.   Pre-operative Diagnosis: Chronic calculus cholecystitis  Post-operative Diagnosis:  Same.   Procedure: Robotic assisted laparoscopic cholecystectomy with Indocyamine Green Ductal Imaging.  Robotic extensive lysis of adhesions.   Surgeon: Honor Leghorn, M.D., FACS  Anesthesia: General. with endotracheal tube  Findings: Total operative time taking 2 hours, typical robotic cholecystectomy about 45 minutes.  Over 1 hour of additional operative time to lyse adhesions and dissect out the critical anatomy of chronic scarring.  Estimated Blood Loss: 25 mL         Drains: None         Specimens: Gallbladder           Complications: none  Procedure Details  The patient was seen again in the Holding Room.  1.25 mg dose of ICG was administered intravenously.   The benefits, complications, treatment options, risks and expected outcomes were again reviewed with the patient. The likelihood of improving the patient's symptoms with return to their baseline status is good.  The patient and/or family concurred with the proposed plan, giving informed consent, again alternatives reviewed.  The patient was taken to Operating Room, identified, and the procedure verified as robotic assisted laparoscopic cholecystectomy.  Prior to the induction of general anesthesia, antibiotic prophylaxis was administered. VTE prophylaxis was in place. General endotracheal anesthesia was then administered and tolerated well. The patient was positioned in the supine position.  After the induction, the abdomen was prepped with Chloraprep and draped in the sterile fashion.  A Time Out was held and the above information confirmed.  After local infiltration of quarter percent Marcaine  with epinephrine , stab incision was made left upper quadrant.  Just below the costal margin at Palmer's point, approximately midclavicular line the Veres needle is passed with sensation  of the layers to penetrate the abdominal wall and into the peritoneum.  Saline drop test is confirmed peritoneal placement.  Insufflation is initiated with carbon dioxide to pressures of 15 mmHg.  Local infiltration with 0.25% Marcaine  with epinephrine  is utilized for all skin incisions.  Made a 12 mm incision on the right periumbilical site, I advanced an optical 11mm port under direct visualization into the peritoneal cavity.  Once the peritoneum was penetrated, insufflation was initiated.  The trocar was then advanced into the abdominal cavity under direct visualization. Pneumoperitoneum was then continued utilizing CO2 at 15 mmHg or less and tolerated well without any adverse changes in the patient's vital signs.  Two 8.5-mm ports were placed in the left lower quadrant and laterally, and one to the right lower quadrant, all under direct vision. Local infiltration with a mixture of Exparel  and 0.25% Marcaine  with epinephrine  is utilized for all port sites with deep infiltration under visualization.   The patient was positioned  in reverse Trendelenburg, tilted the patient's left side down.  Da Vinci XI robot was then positioned on to the patient's left side, and docked.  The gallbladder was identified, the fundus scarred to the anterior abdominal wall.  Extensive adhesions were identified to the adjacent liver, gallbladder, falciform ligament along with violet string style adhesions extending along the dorsal aspect of the liver and surrounding the cholecystostomy tube.    I left the adhesions suspending the gallbladder up but proceeded with dissection of all the omental and fatty adhesions to the inferior aspect of the liver on both sides of the gallbladder falciform ligament to allow adequate visualization of the entire body of the gallbladder down to the neck.  ICG  was critical in visualizing the ductal anatomy.  There is extremely dense scarring and this was quite tedious taking much of her time in  the OR.  The ICG highlighted the cystic duct and the common bile duct and common hepatic duct.  Cystic duct was markedly dilated and there was a lot of dense scarring posterior to it within the triangle of Coelho.  The vascular anatomy was readily identified and divided after clearing the hepatic plate. It was during this time was felt prudent to release the fundus of the gallbladder from its adhesions to the anterior abdominal wall.  Was unable to provide medial retraction of the gallbladder allowing better visualization of the anatomy. I also divided the other hepatic adhesions as well as the cholecystostomy drain at that time, leaving the coiled portion within the gallbladder fundus.  We did achieve the critical view of safety with 2 and only 2 structures entering the gallbladder and we were able to see the liver plate posterior to this from both sides;with the extended critical view of the cystic duct and cystic artery obtained, aided by the ICG via FireFly which improved localization of the major ductal anatomy.    The cystic duct was clearly identified and dissected to isolation.   Artery well isolated and clipped, and the cystic duct was double clipped and divided with scissors, as close to the gallbladder neck as feasible, thus leaving two on the remaining stump.  The specimen side of the artery is sealed with bipolar and divided with monopolar scissors.   The gallbladder was taken from the gallbladder fossa in a retrograde fashion with the electrocautery. The gallbladder was removed and placed in an Endocatch bag.  The liver bed is inspected. Hemostasis was confirmed.  The robot was undocked and moved away from the operative field. 1 L normal saline irrigation was utilized, and utilizing the robotic suction irrigator this was aspirated clear.  Suction irrigator was very helpful with blunt dissection and critical portions of the dissection of the cystic duct.   Multiple photos taken. The  gallbladder and Endocatch sac were then removed through the paraumbilical port site.   Inspection of the right upper quadrant was performed. No bleeding, bile duct injury or leak, or bowel injury was noted. The infra-umbilical port site fascia was closed with interrumpted 0 Vicryl sutures using PMI/cone under direct visualization. Pneumoperitoneum was released and ports removed.  4-0 subcuticular Monocryl was used to close the skin. Dermabond was  applied.  The patient was then extubated and brought to the recovery room in stable condition. Sponge, lap, and needle counts were correct at closure and at the conclusion of the case.  A 2 x 2 with Tegaderm was applied over the prior cholecystostomy drain site.              Honor Leghorn, M.D., [facility] 05/07/2024 10:16 AM

## 2024-05-07 NOTE — Transfer of Care (Signed)
 Immediate Anesthesia Transfer of Care Note  Patient: Douglas Smith  Procedure(s) Performed: CHOLECYSTECTOMY, ROBOT-ASSISTED, LAPAROSCOPIC INDOCYANINE GREEN  FLUORESCENCE IMAGING (ICG) LYSIS, ADHESIONS, LAPAROSCOPIC  Patient Location: PACU  Anesthesia Type:General  Level of Consciousness: drowsy and patient cooperative  Airway & Oxygen Therapy: Patient Spontanous Breathing and Patient connected to face mask oxygen  Post-op Assessment: Report given to RN and Post -op Vital signs reviewed and stable  Post vital signs: Reviewed and stable  Last Vitals:  Vitals Value Taken Time  BP 148/97 05/07/24 10:15  Temp    Pulse 92 05/07/24 10:17  Resp 15 05/07/24 10:17  SpO2 100 % 05/07/24 10:17  Vitals shown include unfiled device data.  Last Pain:  Vitals:   05/07/24 0616  TempSrc: Tympanic  PainSc: 0-No pain         Complications: No notable events documented.

## 2024-05-07 NOTE — Anesthesia Preprocedure Evaluation (Addendum)
 Anesthesia Evaluation  Patient identified by MRN, date of birth, ID band Patient awake    Reviewed: Allergy & Precautions, NPO status , Patient's Chart, lab work & pertinent test results  History of Anesthesia Complications Negative for: history of anesthetic complications  Airway Mallampati: I   Neck ROM: Full    Dental  (+) Poor Dentition, Missing, Loose   Pulmonary COPD, former smoker (quit 4 years ago)   Pulmonary exam normal breath sounds clear to auscultation       Cardiovascular hypertension, Normal cardiovascular exam Rhythm:Regular Rate:Normal  Hx DVT/PE on Eliquis    ECG 02/20/24:  Sinus tachycardia Incomplete right bundle branch block Left anterior fascicular block Moderate voltage criteria for LVH, may be normal variant ( R in aVL , Cornell product ) Septal infarct , age undetermined   Neuro/Psych negative neurological ROS     GI/Hepatic negative GI ROS,,,  Endo/Other  diabetes, Type 2    Renal/GU negative Renal ROS     Musculoskeletal   Abdominal   Peds  Hematology  (+) Blood dyscrasia, anemia   Anesthesia Other Findings   Reproductive/Obstetrics                              Anesthesia Physical Anesthesia Plan  ASA: 3  Anesthesia Plan: General   Post-op Pain Management:    Induction: Intravenous  PONV Risk Score and Plan: 2 and Ondansetron , Dexamethasone  and Treatment may vary due to age or medical condition  Airway Management Planned: Oral ETT  Additional Equipment:   Intra-op Plan:   Post-operative Plan: Extubation in OR  Informed Consent: I have reviewed the patients History and Physical, chart, labs and discussed the procedure including the risks, benefits and alternatives for the proposed anesthesia with the patient or authorized representative who has indicated his/her understanding and acceptance.     Dental advisory given  Plan Discussed with:  CRNA  Anesthesia Plan Comments: (Daughter at bedside for history and consent. Patient consented for risks of anesthesia including but not limited to:  - adverse reactions to medications - damage to eyes, teeth, lips or other oral mucosa - nerve damage due to positioning  - sore throat or hoarseness - damage to heart, brain, nerves, lungs, other parts of body or loss of life  Informed patient about role of CRNA in peri- and intra-operative care.  Patient voiced understanding.)         Anesthesia Quick Evaluation

## 2024-05-07 NOTE — Interval H&P Note (Signed)
 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  The various methods of treatment have been discussed with the patient and family. After consideration of risks, benefits and other options for treatment, the patient has consented to  Procedure(s) with comments: CHOLECYSTECTOMY, ROBOT-ASSISTED, LAPAROSCOPIC (N/A) - w/ICG INDOCYANINE GREEN  FLUORESCENCE IMAGING (ICG) as a surgical intervention.  The patient's history has been reviewed, patient examined, no change in status, stable for surgery.  I have reviewed the patient's chart and labs.  Questions were answered to the patient's satisfaction.     Honor Leghorn

## 2024-05-07 NOTE — Anesthesia Postprocedure Evaluation (Signed)
 Anesthesia Post Note  Patient: Mykal Lehigh  Procedure(s) Performed: CHOLECYSTECTOMY, ROBOT-ASSISTED, LAPAROSCOPIC INDOCYANINE GREEN  FLUORESCENCE IMAGING (ICG) LYSIS, ADHESIONS, LAPAROSCOPIC  Patient location during evaluation: PACU Anesthesia Type: General Level of consciousness: awake and alert, oriented and patient cooperative Pain management: pain level controlled Vital Signs Assessment: post-procedure vital signs reviewed and stable Respiratory status: spontaneous breathing, nonlabored ventilation and respiratory function stable Cardiovascular status: blood pressure returned to baseline and stable Postop Assessment: adequate PO intake Anesthetic complications: no   No notable events documented.   Last Vitals:  Vitals:   05/07/24 1025 05/07/24 1030  BP:    Pulse: 90 89  Resp: (!) 22 (!) 25  Temp:    SpO2: 97% 96%    Last Pain:  Vitals:   05/07/24 1030  TempSrc:   PainSc: 10-Worst pain ever                 Arzell Mcgeehan

## 2024-05-08 ENCOUNTER — Encounter: Payer: Self-pay | Admitting: Surgery

## 2024-05-08 LAB — SURGICAL PATHOLOGY

## 2024-05-14 ENCOUNTER — Encounter: Payer: Self-pay | Admitting: Gastroenterology

## 2024-05-14 NOTE — Progress Notes (Unsigned)
 [facility] POST-OP OFFICE VISIT  05/15/2024  HPI: Douglas Smith is a 73 y.o. male had surgery on  May 07, 2024 , now s/p robotic cholecystectomy.  Progressing well having daily bowel movements of normal color.  Denies any fevers or chills.  Denies any nausea or vomiting.  Reports decent appetite.  Vital signs: BP (!) 154/89   Pulse 83   Temp 98.5 F (36.9 C) (Oral)   Ht 5' 11 (1.803 m)   Wt 176 lb (79.8 kg)   SpO2 96%   BMI 24.55 kg/m    Physical Exam: Constitutional: Appears well, Abdomen: Soft benign nontender.  Nondistended. Skin: All incisions with Dermabond intact.  Periumbilical extraction site with ecchymosis, expected thickness there without evidence of fascial defect or mass.  Assessment/Plan: This is a 73 y.o. male status post robotic cholecystectomy May 07, 2024.  Progressing well.  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  No need for routine follow-up.   Honor Leghorn M.D., FACS 05/15/2024, 10:50 AM

## 2024-05-15 ENCOUNTER — Encounter: Payer: Self-pay | Admitting: Surgery

## 2024-05-15 ENCOUNTER — Ambulatory Visit (INDEPENDENT_AMBULATORY_CARE_PROVIDER_SITE_OTHER): Admitting: Surgery

## 2024-05-15 VITALS — BP 154/89 | HR 83 | Temp 98.5°F | Ht 71.0 in | Wt 176.0 lb

## 2024-05-15 DIAGNOSIS — Z09 Encounter for follow-up examination after completed treatment for conditions other than malignant neoplasm: Secondary | ICD-10-CM

## 2024-05-15 DIAGNOSIS — Z9049 Acquired absence of other specified parts of digestive tract: Secondary | ICD-10-CM | POA: Insufficient documentation

## 2024-05-15 DIAGNOSIS — K8010 Calculus of gallbladder with chronic cholecystitis without obstruction: Secondary | ICD-10-CM

## 2024-05-15 NOTE — Patient Instructions (Signed)

## 2024-05-16 ENCOUNTER — Other Ambulatory Visit: Payer: Self-pay

## 2024-05-16 DIAGNOSIS — K317 Polyp of stomach and duodenum: Secondary | ICD-10-CM

## 2024-05-26 ENCOUNTER — Ambulatory Visit
Admission: RE | Admit: 2024-05-26 | Discharge: 2024-05-26 | Disposition: A | Discharge status: Home / Self Care | Attending: Gastroenterology | Admitting: Gastroenterology

## 2024-05-26 ENCOUNTER — Ambulatory Visit: Admitting: Certified Registered"

## 2024-05-26 ENCOUNTER — Encounter
Admission: RE | Disposition: A | Discharge status: Home / Self Care | Payer: Self-pay | Source: Home / Self Care | Attending: Gastroenterology

## 2024-05-26 ENCOUNTER — Encounter: Payer: Self-pay | Admitting: Gastroenterology

## 2024-05-26 DIAGNOSIS — Z7984 Long term (current) use of oral hypoglycemic drugs: Secondary | ICD-10-CM | POA: Insufficient documentation

## 2024-05-26 DIAGNOSIS — Z8673 Personal history of transient ischemic attack (TIA), and cerebral infarction without residual deficits: Secondary | ICD-10-CM | POA: Diagnosis not present

## 2024-05-26 DIAGNOSIS — K449 Diaphragmatic hernia without obstruction or gangrene: Secondary | ICD-10-CM | POA: Insufficient documentation

## 2024-05-26 DIAGNOSIS — K2970 Gastritis, unspecified, without bleeding: Secondary | ICD-10-CM | POA: Insufficient documentation

## 2024-05-26 DIAGNOSIS — Z79899 Other long term (current) drug therapy: Secondary | ICD-10-CM | POA: Diagnosis not present

## 2024-05-26 DIAGNOSIS — K317 Polyp of stomach and duodenum: Secondary | ICD-10-CM | POA: Diagnosis present

## 2024-05-26 DIAGNOSIS — E119 Type 2 diabetes mellitus without complications: Secondary | ICD-10-CM | POA: Insufficient documentation

## 2024-05-26 DIAGNOSIS — I10 Essential (primary) hypertension: Secondary | ICD-10-CM | POA: Insufficient documentation

## 2024-05-26 DIAGNOSIS — Z87891 Personal history of nicotine dependence: Secondary | ICD-10-CM | POA: Insufficient documentation

## 2024-05-26 DIAGNOSIS — K2950 Unspecified chronic gastritis without bleeding: Secondary | ICD-10-CM

## 2024-05-26 DIAGNOSIS — K31A19 Gastric intestinal metaplasia without dysplasia, unspecified site: Secondary | ICD-10-CM | POA: Insufficient documentation

## 2024-05-26 HISTORY — PX: ESOPHAGOGASTRODUODENOSCOPY: SHX5428

## 2024-05-26 HISTORY — PX: HEMOSTASIS CLIP PLACEMENT: SHX6857

## 2024-05-26 LAB — GLUCOSE, CAPILLARY: Glucose-Capillary: 190 mg/dL — ABNORMAL HIGH (ref 70–99)

## 2024-05-26 SURGERY — EGD (ESOPHAGOGASTRODUODENOSCOPY)
Anesthesia: General

## 2024-05-26 MED ORDER — SODIUM CHLORIDE 0.9 % IV SOLN
INTRAVENOUS | Status: DC
  Administered 2024-05-26: 20 mL/h via INTRAVENOUS

## 2024-05-26 MED ORDER — LIDOCAINE HCL (PF) 2 % IJ SOLN
INTRAMUSCULAR | Status: DC | PRN
Start: 2024-05-26 — End: 2024-05-26
  Administered 2024-05-26: 40 mg via INTRADERMAL

## 2024-05-26 MED ORDER — PROPOFOL 10 MG/ML IV BOLUS
INTRAVENOUS | Status: DC | PRN
  Administered 2024-05-26 (×2): 30 mg via INTRAVENOUS
  Administered 2024-05-26: 40 mg via INTRAVENOUS
  Administered 2024-05-26: 50 mg via INTRAVENOUS

## 2024-05-26 NOTE — Anesthesia Postprocedure Evaluation (Signed)
 Anesthesia Post Note  Patient: Douglas Smith  Procedure(s) Performed: EGD (ESOPHAGOGASTRODUODENOSCOPY) CONTROL OF HEMORRHAGE, GI TRACT, ENDOSCOPIC, BY CLIPPING OR OVERSEWING  Patient location during evaluation: PACU Anesthesia Type: General Level of consciousness: awake Pain management: satisfactory to patient Vital Signs Assessment: post-procedure vital signs reviewed and stable Respiratory status: spontaneous breathing Cardiovascular status: stable Anesthetic complications: no   No notable events documented.   Last Vitals:  Vitals:   05/26/24 1031 05/26/24 1041  BP: 116/61 (!) 156/98  Pulse: 80 74  Resp: 20 20  Temp:    SpO2: 98% 99%    Last Pain:  Vitals:   05/26/24 1041  TempSrc:   PainSc: 0-No pain                 VAN STAVEREN,Leona Alen

## 2024-05-26 NOTE — Anesthesia Preprocedure Evaluation (Signed)
 Anesthesia Evaluation  Patient identified by MRN, date of birth, ID band Patient awake    Reviewed: Allergy & Precautions, NPO status , Patient's Chart, lab work & pertinent test results  Airway Mallampati: II  TM Distance: >3 FB Neck ROM: Full    Dental  (+) Missing, Loose, Dental Advisory Given, Chipped   Pulmonary neg pulmonary ROS, Patient abstained from smoking., former smoker   Pulmonary exam normal        Cardiovascular Exercise Tolerance: Good hypertension, Pt. on medications negative cardio ROS Normal cardiovascular exam Rhythm:Regular Rate:Normal     Neuro/Psych TIAnegative neurological ROS  negative psych ROS   GI/Hepatic negative GI ROS, Neg liver ROS,,,  Endo/Other  negative endocrine ROSdiabetes, Type 2, Oral Hypoglycemic Agents    Renal/GU negative Renal ROS  negative genitourinary   Musculoskeletal   Abdominal   Peds negative pediatric ROS (+)  Hematology negative hematology ROS (+) Blood dyscrasia, anemia   Anesthesia Other Findings Past Medical History: 02/21/2024: Acute cholecystitis 02/21/2024: Acute sepsis (HCC) No date: Anemia 04/17/2024: Chronic cholecystitis due to cholelithiasis with  choledocholithiasis No date: Chronic cholecystitis with calculus No date: Diabetes mellitus without complication (HCC) 2022: DVT (deep venous thrombosis) (HCC) No date: Former smoker No date: HTN (hypertension) No date: Leukocytosis No date: Lung nodule No date: PE (pulmonary thromboembolism) (HCC) No date: Thyroid nodule No date: TIA (transient ischemic attack)  Past Surgical History: 04/28/2024: ERCP; N/A     Comment:  Procedure: ERCP, WITH INTERVENTION IF INDICATED;                Surgeon: Jinny Carmine, MD;  Location: [facility] ENDOSCOPY;                Service: Endoscopy;  Laterality: N/A; 05/07/2024: INDOCYANINE GREEN  FLUORESCENCE IMAGING (ICG)     Comment:  Procedure: INDOCYANINE GREEN  FLUORESCENCE  IMAGING (ICG);              Surgeon: Lane Shope, MD;  Location: [facility] ORS;                Service: General;; 02/22/2024: IR PERC CHOLECYSTOSTOMY 04/15/2024: IR RADIOLOGIST EVAL & MGMT 05/07/2024: LAPAROSCOPIC LYSIS OF ADHESIONS     Comment:  Procedure: LYSIS, ADHESIONS, LAPAROSCOPIC;  Surgeon:               Lane Shope, MD;  Location: [facility] ORS;  Service:               General;; No date: left shoulder surgery; Left  BMI    Body Mass Index: 23.87 kg/m      Reproductive/Obstetrics negative OB ROS                              Anesthesia Physical Anesthesia Plan  ASA: 3  Anesthesia Plan: General   Post-op Pain Management:    Induction: Intravenous  PONV Risk Score and Plan: Propofol  infusion and TIVA  Airway Management Planned: Natural Airway and Nasal Cannula  Additional Equipment:   Intra-op Plan:   Post-operative Plan:   Informed Consent: I have reviewed the patients History and Physical, chart, labs and discussed the procedure including the risks, benefits and alternatives for the proposed anesthesia with the patient or authorized representative who has indicated his/her understanding and acceptance.     Dental Advisory Given  Plan Discussed with: Anesthesiologist, CRNA and Surgeon  Anesthesia Plan Comments:         Anesthesia Quick Evaluation

## 2024-05-26 NOTE — H&P (Signed)
 Rogelia Copping, MD [facility] [address], [address] [place], [place] [address] [phone] Fax : [phone]  Primary Care Physician:  [facility] Primary Gastroenterologist:  Dr. Copping  Pre-Procedure History & Physical: HPI:  Douglas Smith is a 73 y.o. male is here for an endoscopy.   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 04/17/2024   Chronic cholecystitis with calculus    Diabetes mellitus without complication (HCC)    DVT (deep venous thrombosis) (HCC) 2022   Former smoker    HTN (hypertension)    Leukocytosis    Lung nodule    PE (pulmonary thromboembolism) (HCC)    Thyroid nodule    TIA (transient ischemic attack)     Past Surgical History:  Procedure Laterality Date   ERCP N/A 04/28/2024   Procedure: ERCP, WITH INTERVENTION IF INDICATED;  Surgeon: Copping Rogelia, MD;  Location: [facility];  Service: Endoscopy;  Laterality: N/A;   INDOCYANINE GREEN  FLUORESCENCE IMAGING (ICG)  05/07/2024   Procedure: INDOCYANINE GREEN  FLUORESCENCE IMAGING (ICG);  Surgeon: Lane Shope, MD;  Location: [facility] ORS;  Service: General;;   IR PERC CHOLECYSTOSTOMY  02/22/2024   IR RADIOLOGIST EVAL & MGMT  04/15/2024   LAPAROSCOPIC LYSIS OF ADHESIONS  05/07/2024   Procedure: LYSIS, ADHESIONS, LAPAROSCOPIC;  Surgeon: Lane Shope, MD;  Location: [facility] ORS;  Service: General;;   left shoulder surgery Left     Prior to Admission medications   Medication Sig Start Date End Date Taking? Authorizing Provider  apixaban  (ELIQUIS ) 5 MG TABS tablet Take 5 mg by mouth 2 (two) times daily.   Yes [provider]  atorvastatin  (LIPITOR) 20 MG tablet Take 20 mg by mouth in the morning. 01/10/24  Yes [provider]  empagliflozin  (JARDIANCE ) 25 MG TABS tablet Take 12.5 mg by mouth in the morning. 08/15/23  Yes [provider]  HYDROcodone -acetaminophen  (NORCO/VICODIN) 5-325 MG tablet Take 1 tablet by mouth every 6 (six) hours as needed for moderate pain (pain score 4-6). 05/07/24  Yes Lane Shope, MD  losartan  (COZAAR ) 50 MG tablet Take 25 mg by mouth in the morning. 08/15/23  Yes [provider]  omeprazole  (PRILOSEC ) 20 MG capsule Take 20 mg by mouth daily before breakfast.   Yes [provider]  sodium chloride  flush 0.9 % SOLN injection Flush the drain with 5 mL of normal saline every day. 04/11/24  Yes Lane Shope, MD    Allergies as of 05/16/2024   (No Known Allergies)    Family History  Problem Relation Age of Onset   Stroke Brother     Social History   Socioeconomic History   Marital status: Single    Spouse name: Not on file   Number of children: Not on file   Years of education: Not on file   Highest education level: Not on file  Occupational History   Not on file  Tobacco Use   Smoking status: Former    Types: Cigarettes   Smokeless tobacco: Never  Vaping Use   Vaping status: Never Used  Substance and Sexual Activity   Alcohol use: Not Currently   Drug use: Never   Sexual activity: Not on file  Other Topics Concern   Not on file  Social History Narrative   Not on file   Social Drivers of Health   Financial Resource Strain: Not on file  Food Insecurity: Patient Declined (02/21/2024)   Hunger Vital Sign    Worried About Running Out of Food in the Last Year: Patient declined    Ran Out of Food in the Last Year: Patient declined  Transportation Needs: Patient Declined (02/21/2024)   PRAPARE - Administrator, Civil Service (Medical): Patient declined    Lack of Transportation (Non-Medical): Patient declined  Physical Activity: Not on file  Stress: Not on file  Social Connections: Patient Declined (02/21/2024)   Social Connection and Isolation Panel    Frequency of Communication with Friends and Family: Patient declined    Frequency of Social  Gatherings with Friends and Family: Patient declined    Attends Religious Services: Patient declined    Database administrator or Organizations: Patient declined    Attends Banker Meetings: Patient declined    Marital Status: Patient declined  Intimate Partner Violence: Patient Declined (02/21/2024)   Humiliation, Afraid, Rape, and Kick questionnaire    Fear of Current or Ex-Partner: Patient declined    Emotionally Abused: Patient declined    Physically Abused: Patient declined    Sexually Abused: Patient declined    Review of Systems: See HPI, otherwise negative ROS  Physical Exam: BP (!) 126/99   Pulse 85   Temp (!) 96.9 F (36.1 C) (Temporal)   Resp 20   Ht 6' (1.829 m)   Wt 79.8 kg   SpO2 98%   BMI 23.87 kg/m  General:   Alert,  pleasant and cooperative in NAD Head:  Normocephalic and atraumatic. Neck:  Supple; no masses or thyromegaly. Lungs:  Clear throughout to auscultation.    Heart:  Regular rate and rhythm. Abdomen:  Soft, nontender and nondistended. Normal bowel sounds, without guarding, and without rebound.   Neurologic:  Alert and  oriented x4;  grossly normal neurologically.  Impression/Plan: Douglas Smith is here for an endoscopy to be performed for gastric polyp  Risks, benefits, limitations, and alternatives regarding  endoscopy have been reviewed with the patient.  Questions have been answered.  All parties agreeable.   Rogelia Copping, MD  05/26/2024, 10:03 AM

## 2024-05-26 NOTE — Op Note (Signed)
 [facility] Gastroenterology Patient Name: Douglas Smith Procedure Date: 05/26/2024 9:47 AM MRN: [id number] Account #: [id number] Date of Birth: 06-21-1951 Admit Type: Outpatient Age: 73 Room: [facility] ENDO ROOM 4 Gender: Male Note Status: Finalized Instrument Name: Upper Endoscope [id number] Procedure:             Upper GI endoscopy Indications:           For therapy of gastric polyps Providers:             Rogelia Copping MD, MD Referring MD:          [facility], MD (Referring MD) Medicines:             Propofol  per Anesthesia Complications:         No immediate complications. Procedure:             Pre-Anesthesia Assessment:                        - Prior to the procedure, a History and Physical was                         performed, and patient medications and allergies were                         reviewed. The patient's tolerance of previous                         anesthesia was also reviewed. The risks and benefits                         of the procedure and the sedation options and risks                         were discussed with the patient. All questions were                         answered, and informed consent was obtained. Prior                         Anticoagulants: The patient has taken Eliquis                          (apixaban ), last dose was 2 days prior to procedure.                         ASA Grade Assessment: II - A patient with mild                         systemic disease. After reviewing the risks and                         benefits, the patient was deemed in satisfactory                         condition to undergo the procedure.                        After obtaining informed consent, the endoscope was  passed under direct vision. Throughout the procedure,                         the patient's blood pressure, pulse, and oxygen                         saturations were monitored continuously. The Endoscope                          was introduced through the mouth, and advanced to the                         second part of duodenum. The upper GI endoscopy was                         accomplished without difficulty. The patient tolerated                         the procedure well. Findings:      A medium-sized hiatal hernia was present.      Diffuse mild inflammation was found in the gastric antrum.      A single 7 mm pedunculated polyp with no bleeding and no stigmata of       recent bleeding was found in the gastric antrum. The polyp was removed       with a hot snare. Resection and retrieval were complete. To prevent       bleeding post-intervention, three hemostatic clips were successfully       placed (MR conditional). Clip manufacturer: [employer]. There was       no bleeding at the end of the procedure.      The examined duodenum was normal. Impression:            - Medium-sized hiatal hernia.                        - Gastritis.                        - A single gastric polyp. Resected and retrieved.                         Clips (MR conditional) were placed. Clip manufacturer:                         [employer].                        - Normal examined duodenum. Recommendation:        - Discharge patient to home.                        - Resume previous diet.                        - Continue present medications.                        - Await pathology results. Procedure Code(s):     --- Professional ---  43251, Esophagogastroduodenoscopy, flexible,                         transoral; with removal of tumor(s), polyp(s), or                         other lesion(s) by snare technique Diagnosis Code(s):     --- Professional ---                        K31.7, Polyp of stomach and duodenum                        K29.70, Gastritis, unspecified, without bleeding CPT copyright 2022 [employer]. All rights reserved. The codes documented in  this report are preliminary and upon coder review may  be revised to meet current compliance requirements. Rogelia Copping MD, MD 05/26/2024 10:19:43 AM This report has been signed electronically. Number of Addenda: 0 Note Initiated On: 05/26/2024 9:47 AM Estimated Blood Loss:  Estimated blood loss: none.      [facility]

## 2024-05-26 NOTE — Transfer of Care (Signed)
 Immediate Anesthesia Transfer of Care Note  Patient: Douglas Smith  Procedure(s) Performed: EGD (ESOPHAGOGASTRODUODENOSCOPY) CONTROL OF HEMORRHAGE, GI TRACT, ENDOSCOPIC, BY CLIPPING OR OVERSEWING  Patient Location: PACU  Anesthesia Type:MAC  Level of Consciousness: drowsy  Airway & Oxygen Therapy: Patient Spontanous Breathing and Patient connected to nasal cannula oxygen  Post-op Assessment: Report given to RN and Post -op Vital signs reviewed and stable  Post vital signs: Reviewed and stable  Last Vitals:  Vitals Value Taken Time  BP 119/90 05/26/24 10:21  Temp 36.1 C 05/26/24 10:21  Pulse 90 05/26/24 10:21  Resp 20 05/26/24 10:21  SpO2 97 % 05/26/24 10:21    Last Pain:  Vitals:   05/26/24 1021  TempSrc: Tympanic  PainSc: Asleep         Complications: No notable events documented.

## 2024-05-27 LAB — SURGICAL PATHOLOGY

## 2024-05-28 ENCOUNTER — Ambulatory Visit: Payer: Self-pay | Admitting: Gastroenterology

## 2024-09-23 ENCOUNTER — Other Ambulatory Visit: Payer: Self-pay | Admitting: Internal Medicine

## 2024-09-23 DIAGNOSIS — Z720 Tobacco use: Secondary | ICD-10-CM

## 2024-10-02 ENCOUNTER — Ambulatory Visit: Admission: RE | Admit: 2024-10-02 | Discharge: 2024-10-02 | Attending: Internal Medicine | Admitting: Internal Medicine

## 2024-10-02 DIAGNOSIS — Z720 Tobacco use: Secondary | ICD-10-CM | POA: Insufficient documentation
